# Patient Record
Sex: Male | Born: 1985 | Race: Black or African American | Hispanic: No | Marital: Married | Smoking: Former smoker
Health system: Southern US, Community
[De-identification: ages and names within clinical notes are randomized; demographics above are authoritative.]

## PROBLEM LIST (undated history)

## (undated) DIAGNOSIS — R202 Paresthesia of skin: Secondary | ICD-10-CM

## (undated) DIAGNOSIS — R519 Headache, unspecified: Secondary | ICD-10-CM

## (undated) DIAGNOSIS — R51 Headache: Secondary | ICD-10-CM

## (undated) HISTORY — DX: Paresthesia of skin: R20.2

## (undated) HISTORY — DX: Headache, unspecified: R51.9

## (undated) HISTORY — PX: APPENDECTOMY: SHX54

## (undated) HISTORY — PX: EYE SURGERY: SHX253

## (undated) HISTORY — DX: Headache: R51

---

## 2015-03-07 ENCOUNTER — Encounter (HOSPITAL_COMMUNITY): Payer: Self-pay | Admitting: *Deleted

## 2015-03-07 ENCOUNTER — Emergency Department (INDEPENDENT_AMBULATORY_CARE_PROVIDER_SITE_OTHER)
Admission: EM | Admit: 2015-03-07 | Discharge: 2015-03-07 | Disposition: A | Discharge status: Home / Self Care | Payer: Medicaid Other | Source: Home / Self Care | Attending: Family Medicine | Admitting: Family Medicine

## 2015-03-07 DIAGNOSIS — K2900 Acute gastritis without bleeding: Secondary | ICD-10-CM | POA: Diagnosis not present

## 2015-03-07 MED ORDER — RANITIDINE HCL 150 MG PO TABS: 150.0000 mg | ORAL_TABLET | Freq: Two times a day (BID) | ORAL | Status: DC

## 2015-03-07 MED ORDER — ONDANSETRON 4 MG PO TBDP
ORAL_TABLET | ORAL | Status: AC
Start: 2015-03-07 — End: 2015-03-07
  Filled 2015-03-07: qty 1

## 2015-03-07 MED ORDER — ONDANSETRON 4 MG PO TBDP
4.0000 mg | ORAL_TABLET | Freq: Once | ORAL | Status: AC
  Administered 2015-03-07: 4 mg via ORAL

## 2015-03-07 MED ORDER — GI COCKTAIL ~~LOC~~
30.0000 mL | Freq: Once | ORAL | Status: AC
  Administered 2015-03-07: 30 mL via ORAL

## 2015-03-07 MED ORDER — GI COCKTAIL ~~LOC~~
ORAL | Status: AC
  Filled 2015-03-07: qty 30

## 2015-03-07 NOTE — ED Notes (Signed)
Pt   Reports  Abdominal  Pain  With  Nausea         No  Vomiting x  10 days    Worse  Yesterday       Sitting  Upright on  Exam table  Speaking in  Complete  sentances  And  Is  In no  Acute distress     recentlly  Arrived from  [place]

## 2015-03-07 NOTE — ED Provider Notes (Signed)
CSN: [id number]     Arrival date & time 03/07/15  1624 History   First MD Initiated Contact with Patient 03/07/15 1720     Chief Complaint  Patient presents with  . Abdominal Pain   (Consider location/radiation/quality/duration/timing/severity/associated sxs/prior Treatment) Patient is a 29 y.o. male presenting with abdominal pain. The history is provided by the patient. The history is limited by a language barrier. A language interpreter was used.  Abdominal Pain Pain location:  Epigastric Pain quality: burning   Pain radiates to:  Back Pain severity:  Mild Onset quality:  Gradual Duration:  10 days Progression:  Unchanged Chronicity:  New Context comment:  Refugee from [place] , arrived yest., has been told he  has IBS Relieved by:  None tried Worsened by:  Nothing tried Ineffective treatments:  None tried Associated symptoms: chest pain and nausea   Associated symptoms: no anorexia, no diarrhea, no fever and no vomiting     History reviewed. No pertinent past medical history. Past Surgical History  Procedure Laterality Date  . Appendectomy     History reviewed. No pertinent family history. Social History  Substance Use Topics  . Smoking status: Never Smoker   . Smokeless tobacco: None  . Alcohol Use: No    Review of Systems  Constitutional: Negative.  Negative for fever.  Respiratory: Negative.   Cardiovascular: Positive for chest pain.  Gastrointestinal: Positive for nausea and abdominal pain. Negative for vomiting, diarrhea and anorexia.    Allergies  Review of patient's allergies indicates no known allergies.  Home Medications   Prior to Admission medications   Medication Sig Start Date End Date Taking? Authorizing Provider  ranitidine (ZANTAC) 150 MG tablet Take 1 tablet (150 mg total) by mouth 2 (two) times daily. 03/07/15   Linna Hoff, MD   BP 122/82 mmHg  Pulse 64  Temp(Src) 98.5 F (36.9 C) (Oral)  Resp 16  SpO2 100% Physical Exam   Constitutional: He is oriented to person, place, and time. He appears well-developed and well-nourished. No distress.  HENT:  Mouth/Throat: Oropharynx is clear and moist.  Neck: Normal range of motion. Neck supple.  Cardiovascular: Normal rate, regular rhythm, normal heart sounds and intact distal pulses.   Pulmonary/Chest: Effort normal and breath sounds normal.  Abdominal: Soft. Bowel sounds are normal. He exhibits no distension and no mass. There is no tenderness. There is no rebound and no guarding.  Neurological: He is alert and oriented to person, place, and time.  Skin: Skin is warm and dry.  Nursing note and vitals reviewed.   ED Course  Procedures (including critical care time) Labs Review Labs Reviewed - No data to display  Imaging Review No results found.   MDM   1. Acute gastritis without hemorrhage        Linna Hoff, MD 03/07/15 1750

## 2015-04-16 ENCOUNTER — Emergency Department (INDEPENDENT_AMBULATORY_CARE_PROVIDER_SITE_OTHER)
Admission: EM | Admit: 2015-04-16 | Discharge: 2015-04-16 | Disposition: A | Discharge status: Home / Self Care | Payer: Medicaid Other | Source: Home / Self Care | Attending: Family Medicine | Admitting: Family Medicine

## 2015-04-16 ENCOUNTER — Encounter (HOSPITAL_COMMUNITY): Payer: Self-pay | Admitting: *Deleted

## 2015-04-16 DIAGNOSIS — K2970 Gastritis, unspecified, without bleeding: Secondary | ICD-10-CM | POA: Diagnosis not present

## 2015-04-16 DIAGNOSIS — K589 Irritable bowel syndrome without diarrhea: Secondary | ICD-10-CM

## 2015-04-16 MED ORDER — OMEPRAZOLE 20 MG PO CPDR: 20.0000 mg | DELAYED_RELEASE_CAPSULE | Freq: Every day | ORAL | Status: DC

## 2015-04-16 NOTE — ED Provider Notes (Signed)
CSN: [id number]     Arrival date & time 04/16/15  1725 History   First MD Initiated Contact with Patient 04/16/15 1757     No chief complaint on file.  (Consider location/radiation/quality/duration/timing/severity/associated sxs/prior Treatment) The history is provided by the patient.   cyst 29 year old refugee from [place] who is been living in [place] for the last 5 months. He developed migratory abdominal pain over the last 3 months without diarrhea or vomiting. He was seen a month ago and given ranitidine and he seemed to be improving but then the symptoms came back.  Patient notices some improvement after eating. When he stressed or angry, the pain is worse  No past medical history on file. Past Surgical History  Procedure Laterality Date  . Appendectomy     No family history on file. Social History  Substance Use Topics  . Smoking status: Never Smoker   . Smokeless tobacco: Not on file  . Alcohol Use: No    Review of Systems  Constitutional: Negative.   HENT: Negative.   Eyes: Negative.   Respiratory: Negative.   Cardiovascular: Negative.   Gastrointestinal: Positive for abdominal pain. Negative for nausea, vomiting, diarrhea, constipation, blood in stool and rectal pain.  Musculoskeletal: Negative.   Skin: Negative.     Allergies  Review of patient's allergies indicates no known allergies.  Home Medications   Prior to Admission medications   Medication Sig Start Date End Date Taking? Authorizing Provider  ranitidine (ZANTAC) 150 MG tablet Take 1 tablet (150 mg total) by mouth 2 (two) times daily. 03/07/15   Linna Hoff, MD   Meds Ordered and Administered this Visit  Medications - No data to display  There were no vitals taken for this visit. No data found.   Physical Exam  ED Course  Procedures (including critical care time)   MDM  Assessment: 29 year old gentleman engage in resettling in [place]. He's had tremendous up he was in his life over the  last 5 months. I suspect he has some degree of posttraumatic stress disorder with IBS and gastritis. It's possible he has some GI parasite, but without more objective findings or disturbing symptoms, I think it's appropriate to wait and take the prescribed measures first.      ICD-9-CM ICD-10-CM   1. Gastritis 535.50 K29.70 omeprazole (PRILOSEC) 20 MG capsule  2. IBS (irritable bowel syndrome) 564.1 K58.9 omeprazole (PRILOSEC) 20 MG capsule     Signed, Elvina Sidle, MD    Elvina Sidle, MD 04/16/15 [phone]

## 2015-04-16 NOTE — Discharge Instructions (Signed)
Please start align probiotic and take daily for 2 weeks   Gastritis, Adult Gastritis is soreness and swelling (inflammation) of the lining of the stomach. Gastritis can develop as a sudden onset (acute) or long-term (chronic) condition. If gastritis is not treated, it can lead to stomach bleeding and ulcers. CAUSES  Gastritis occurs when the stomach lining is weak or damaged. Digestive juices from the stomach then inflame the weakened stomach lining. The stomach lining may be weak or damaged due to viral or bacterial infections. One common bacterial infection is the Helicobacter pylori infection. Gastritis can also result from excessive alcohol consumption, taking certain medicines, or having too much acid in the stomach.  SYMPTOMS  In some cases, there are no symptoms. When symptoms are present, they may include:  Pain or a burning sensation in the upper abdomen.  Nausea.  Vomiting.  An uncomfortable feeling of fullness after eating. DIAGNOSIS  Your caregiver may suspect you have gastritis based on your symptoms and a physical exam. To determine the cause of your gastritis, your caregiver may perform the following:  Blood or stool tests to check for the H pylori bacterium.  Gastroscopy. A thin, flexible tube (endoscope) is passed down the esophagus and into the stomach. The endoscope has a light and camera on the end. Your caregiver uses the endoscope to view the inside of the stomach.  Taking a tissue sample (biopsy) from the stomach to examine under a microscope. TREATMENT  Depending on the cause of your gastritis, medicines may be prescribed. If you have a bacterial infection, such as an H pylori infection, antibiotics may be given. If your gastritis is caused by too much acid in the stomach, H2 blockers or antacids may be given. Your caregiver may recommend that you stop taking aspirin, ibuprofen, or other nonsteroidal anti-inflammatory drugs (NSAIDs). HOME CARE INSTRUCTIONS  Only  take over-the-counter or prescription medicines as directed by your caregiver.  If you were given antibiotic medicines, take them as directed. Finish them even if you start to feel better.  Drink enough fluids to keep your urine clear or pale yellow.  Avoid foods and drinks that make your symptoms worse, such as:  Caffeine or alcoholic drinks.  Chocolate.  Peppermint or mint flavorings.  Garlic and onions.  Spicy foods.  Citrus fruits, such as oranges, lemons, or limes.  Tomato-based foods such as sauce, chili, salsa, and pizza.  Fried and fatty foods.  Eat small, frequent meals instead of large meals. SEEK IMMEDIATE MEDICAL CARE IF:   You have black or dark red stools.  You vomit blood or material that looks like coffee grounds.  You are unable to keep fluids down.  Your abdominal pain gets worse.  You have a fever.  You do not feel better after 1 week.  You have any other questions or concerns. MAKE SURE YOU:  Understand these instructions.  Will watch your condition.  Will get help right away if you are not doing well or get worse. Document Released: 06/30/2001 Document Revised: 01/05/2012 Document Reviewed: 08/19/2011 [facility] Patient Information 2015 [place], [place]. This information is not intended to replace advice given to you by your health care provider. Make sure you discuss any questions you have with your health care provider. Irritable Bowel Syndrome Irritable bowel syndrome (IBS) is caused by a disturbance of normal bowel function and is a common digestive disorder. You may also hear this condition called spastic colon, mucous colitis, and irritable colon. There is no cure for IBS. However,  symptoms often gradually improve or disappear with a good diet, stress management, and medicine. This condition usually appears in late adolescence or early adulthood. Women develop it twice as often as men. CAUSES  After food has been digested and absorbed in the  small intestine, waste material is moved into the large intestine, or colon. In the colon, water and salts are absorbed from the undigested products coming from the small intestine. The remaining residue, or fecal material, is held for elimination. Under normal circumstances, gentle, rhythmic contractions of the bowel walls push the fecal material along the colon toward the rectum. In IBS, however, these contractions are irregular and poorly coordinated. The fecal material is either retained too long, resulting in constipation, or expelled too soon, producing diarrhea. SIGNS AND SYMPTOMS  The most common symptom of IBS is abdominal pain. It is often in the lower left side of the abdomen, but it may occur anywhere in the abdomen. The pain comes from spasms of the bowel muscles happening too much and from the buildup of gas and fecal material in the colon. This pain:  Can range from sharp abdominal cramps to a dull, continuous ache.  Often worsens soon after eating.  Is often relieved by having a bowel movement or passing gas. Abdominal pain is usually accompanied by constipation, but it may also produce diarrhea. The diarrhea often occurs right after a meal or upon waking up in the morning. The stools are often soft, watery, and flecked with mucus. Other symptoms of IBS include:  Bloating.  Loss of appetite.  Heartburn.  Backache.  Dull pain in the arms or shoulders.  Nausea.  Burping.  Vomiting.  Gas. IBS may also cause symptoms that are unrelated to the digestive system, such as:  Fatigue.  Headaches.  Anxiety.  Shortness of breath.  Trouble concentrating.  Dizziness. These symptoms tend to come and go. DIAGNOSIS  The symptoms of IBS may seem like symptoms of other, more serious digestive disorders. Your health care provider may want to perform tests to exclude these disorders.  TREATMENT Many medicines are available to help correct bowel function or relieve bowel  spasms and abdominal pain. Among the medicines available are:  Laxatives for severe constipation and to help restore normal bowel habits.  Specific antidiarrheal medicines to treat severe or lasting diarrhea.  Antispasmodic agents to relieve intestinal cramps. Your health care provider may also decide to treat you with a mild tranquilizer or sedative during unusually stressful periods in your life. Your health care provider may also prescribe antidepressant medicine. The use of this medicine has been shown to reduce pain and other symptoms of IBS. Remember that if any medicine is prescribed for you, you should take it exactly as directed. Make sure your health care provider knows how well it worked for you. HOME CARE INSTRUCTIONS   Take all medicines as directed by your health care provider.  Avoid foods that are high in fat or oils, such as heavy cream, butter, frankfurters, sausage, and other fatty meats.  Avoid foods that make you go to the bathroom, such as fruit, fruit juice, and dairy products.  Cut out carbonated drinks, chewing gum, and "gassy" foods such as beans and cabbage. This may help relieve bloating and burping.  Eat foods with bran, and drink plenty of liquids with the bran foods. This helps relieve constipation.  Keep track of what foods seem to bring on your symptoms.  Avoid emotionally charged situations or circumstances that produce anxiety.  Start  or continue exercising.  Get plenty of rest and sleep. Document Released: 07/06/2005 Document Revised: 07/11/2013 Document Reviewed: 02/24/2008 [facility] Patient Information 2015 [place], [place]. This information is not intended to replace advice given to you by your health care provider. Make sure you discuss any questions you have with your health care provider.

## 2015-04-16 NOTE — ED Notes (Signed)
abd  Pain        No  Nausea  Or  Vomiting   No  Diarrhea      Symptoms  X  2  Days

## 2015-05-21 ENCOUNTER — Emergency Department (HOSPITAL_COMMUNITY): Payer: Medicaid Other

## 2015-05-21 ENCOUNTER — Emergency Department (HOSPITAL_COMMUNITY)
Admission: EM | Admit: 2015-05-21 | Discharge: 2015-05-21 | Disposition: A | Discharge status: Home / Self Care | Payer: Medicaid Other | Attending: Emergency Medicine | Admitting: Emergency Medicine

## 2015-05-21 ENCOUNTER — Encounter (HOSPITAL_COMMUNITY): Payer: Self-pay

## 2015-05-21 DIAGNOSIS — R079 Chest pain, unspecified: Secondary | ICD-10-CM | POA: Diagnosis present

## 2015-05-21 DIAGNOSIS — Z79899 Other long term (current) drug therapy: Secondary | ICD-10-CM | POA: Insufficient documentation

## 2015-05-21 DIAGNOSIS — F419 Anxiety disorder, unspecified: Secondary | ICD-10-CM | POA: Diagnosis not present

## 2015-05-21 LAB — BASIC METABOLIC PANEL
ANION GAP: 9 (ref 5–15)
BUN: 9 mg/dL (ref 6–20)
CHLORIDE: 99 mmol/L — AB (ref 101–111)
CO2: 27 mmol/L (ref 22–32)
Calcium: 9.7 mg/dL (ref 8.9–10.3)
Creatinine, Ser: 0.85 mg/dL (ref 0.61–1.24)
GFR calc non Af Amer: >60 mL/min (ref >60)
Glucose, Bld: 107 mg/dL — ABNORMAL HIGH (ref 65–99)
POTASSIUM: 3.8 mmol/L (ref 3.5–5.1)
SODIUM: 135 mmol/L (ref 135–145)

## 2015-05-21 LAB — CBC
HEMATOCRIT: 45.5 % (ref 39.0–52.0)
Hemoglobin: 16 g/dL (ref 13.0–17.0)
MCH: 31.9 pg (ref 26.0–34.0)
MCHC: 35.2 g/dL (ref 30.0–36.0)
MCV: 90.6 fL (ref 78.0–100.0)
PLATELETS: 164 10*3/uL (ref 150–400)
RBC: 5.02 MIL/uL (ref 4.22–5.81)
RDW: 13.1 % (ref 11.5–15.5)
WBC: 7.5 10*3/uL (ref 4.0–10.5)

## 2015-05-21 LAB — I-STAT TROPONIN, ED: Troponin i, poc: 0 ng/mL (ref 0.00–0.08)

## 2015-05-21 NOTE — Discharge Instructions (Signed)
Return without fail for worsening symptoms, including worsening pain, difficulty breathing, passing out or pain while exercising, or any other symptoms concerning to you.  Nonspecific Chest Pain It is often hard to find the cause of chest pain. There is always a chance that your pain could be related to something serious, such as a heart attack or a blood clot in your lungs. Chest pain can also be caused by conditions that are not life-threatening. If you have chest pain, it is very important to follow up with your doctor.  HOME CARE  If you were prescribed an antibiotic medicine, finish it all even if you start to feel better.  Avoid any activities that cause chest pain.  Do not use any tobacco products, including cigarettes, chewing tobacco, or electronic cigarettes. If you need help quitting, ask your doctor.  Do not drink alcohol.  Take medicines only as told by your doctor.  Keep all follow-up visits as told by your doctor. This is important. This includes any further testing if your chest pain does not go away.  Your doctor may tell you to keep your head raised (elevated) while you sleep.  Make lifestyle changes as told by your doctor. These may include:  Getting regular exercise. Ask your doctor to suggest some activities that are safe for you.  Eating a heart-healthy diet. Your doctor or a diet specialist (dietitian) can help you to learn healthy eating options.  Maintaining a healthy weight.  Managing diabetes, if necessary.  Reducing stress. GET HELP IF:  Your chest pain does not go away, even after treatment.  You have a rash with blisters on your chest.  You have a fever. GET HELP RIGHT AWAY IF:  Your chest pain is worse.  You have an increasing cough, or you cough up blood.  You have severe belly (abdominal) pain.  You feel extremely weak.  You pass out (faint).  You have chills.  You have sudden, unexplained chest discomfort.  You have sudden,  unexplained discomfort in your arms, back, neck, or jaw.  You have shortness of breath at any time.  You suddenly start to sweat, or your skin gets clammy.  You feel nauseous.  You vomit.  You suddenly feel light-headed or dizzy.  Your heart begins to beat quickly, or it feels like it is skipping beats. These symptoms may be an emergency. Do not wait to see if the symptoms will go away. Get medical help right away. Call your local emergency services (911 in the [place] Do not drive yourself to the hospital.   This information is not intended to replace advice given to you by your health care provider. Make sure you discuss any questions you have with your health care provider.   Document Released: 12/23/2007 Document Revised: 07/27/2014 Document Reviewed: 02/09/2014 Elsevier Interactive Patient Education [employer]2016 [employer]vier Inc.

## 2015-05-21 NOTE — ED Notes (Addendum)
Pt on hold for D/C waiting for social worker per Dr. Verdie MosherLiu

## 2015-05-21 NOTE — ED Notes (Signed)
Triaged using Arabic interpreter via telephone. Pt reports intermittent left sided chest pain ongoing "for a while when I am stressed. When I am stressed this is exactly what happens to me." Pt also reports SOB with these episodes and is concerned about his left hand appearing red or discolored. Left hand appears WDL.

## 2015-05-21 NOTE — Care Management (Signed)
ED CM received consult concerning establishing follow up care patient from [place] and speaks only Arabic. CM reviewed patient's record, patient is a [place] refugee living here for 5 months. CM communicated with patient via [employer] [phone]. CM explained via interpreter that patient is being discharged today with a [facility]11 at 3:30pm to establish care, patient was instructed to bring ID.  [facility] hand[facility] and told to show this information to the interpreter that will come to pick him up tonight. Patient verbalized understanding via the interpreter. No further questions or concerns, as per interpreter. Dr. Joni FearsLui and Shanda BumpsJessica RN made aware.

## 2015-05-21 NOTE — ED Notes (Signed)
Patient is alert and orientedx4.  Patient was explained discharge instructions and they understood them with no questions.  An interpreter was used to explain all to him.

## 2015-05-21 NOTE — ED Notes (Signed)
MD at bedside w/ RN using translator phone to communicate and assess.

## 2015-05-21 NOTE — ED Provider Notes (Signed)
CSN: [id number]     Arrival date & time 05/21/15  1453 History   First MD Initiated Contact with Patient 05/21/15 1834     Chief Complaint  Patient presents with  . Chest Pain     (Consider location/radiation/quality/duration/timing/severity/associated sxs/prior Treatment) HPI   History is obtained through an Arabic speaking interpreter. 29 year old male who presents with chest pain. He is otherwise healthy. States that for several years now he has been having intermittent chest pain. Described as a pressure and tightness over the left side of his chest, and occurs when he is very anxious or having significant amount of stress. Is sometimes associated with shortness of breath he states. States that his symptoms typically go away when his anxiousness resolves. Denies any associating lightheadedness, syncope, nausea or vomiting. Has not had any recent illnesses including fevers, chills, cough, or other upper respiratory symptoms. Used to play soccer, and states that he has never had chest pain difficulty breathing syncope or near syncope while doing exertional activities. Does not smoke, does not have a strong family history of early heart disease or sudden death. Currently asymptomatic.   History reviewed. No pertinent past medical history. Past Surgical History  Procedure Laterality Date  . Appendectomy     History reviewed. No pertinent family history. Social History  Substance Use Topics  . Smoking status: Never Smoker   . Smokeless tobacco: None  . Alcohol Use: No    Review of Systems 10/14 systems reviewed and are negative other than those stated in the HPI    Allergies  Review of patient's allergies indicates no known allergies.  Home Medications   Prior to Admission medications   Medication Sig Start Date End Date Taking? Authorizing Provider  omeprazole (PRILOSEC) 20 MG capsule Take 1 capsule (20 mg total) by mouth daily. 04/16/15  Yes Elvina Sidle, MD  ranitidine  (ZANTAC) 150 MG tablet Take 1 tablet (150 mg total) by mouth 2 (two) times daily. Patient not taking: Reported on 05/21/2015 03/07/15   Linna Hoff, MD   BP 122/74 mmHg  Pulse 65  Temp(Src) 97.4 F (36.3 C) (Oral)  Resp 16  SpO2 98% Physical Exam Physical Exam  Nursing note and vitals reviewed. Constitutional: Well developed, well nourished, non-toxic, and in no acute distress Head: Normocephalic and atraumatic.  Mouth/Throat: Oropharynx is clear and moist.  Neck: Normal range of motion. Neck supple.  Cardiovascular: Normal rate and regular rhythm.   Pulmonary/Chest: Effort normal and breath sounds normal.  Abdominal: Soft. There is minimal epigastric tenderness. There is no rebound and no guarding.  Musculoskeletal: Normal range of motion.  Neurological: Alert, no facial droop, fluent speech, moves all extremities symmetrically Skin: Skin is warm and dry.  Psychiatric: Cooperative   ED Course  Procedures (including critical care time) Labs Review Labs Reviewed  BASIC METABOLIC PANEL - Abnormal; Notable for the following:    Chloride 99 (*)    Glucose, Bld 107 (*)    All other components within normal limits  CBC  I-STAT TROPOININ, ED    Imaging Review Dg Chest 2 View  05/21/2015  CLINICAL DATA:  Left-sided chest pain and left arm pain today. EXAM: CHEST  2 VIEW COMPARISON:  None. FINDINGS: The heart size and mediastinal contours are within normal limits. Both lungs are clear. The visualized skeletal structures are unremarkable. IMPRESSION: Normal chest x-ray. Electronically Signed   By: Rudie Meyer M.D.   On: 05/21/2015 16:11   I have personally reviewed and evaluated these images  and lab results as part of my medical decision-making.   EKG Interpretation   Date/Time:  Tuesday May 21 2015 15:13:25 EDT Ventricular Rate:  93 PR Interval:  142 QRS Duration: 82 QT Interval:  386 QTC Calculation: 479 R Axis:   82 Text Interpretation:  Normal sinus rhythm  Biatrial enlargement Abnormal  ECG No prior EKG for comparison Confirmed by Rosabelle Jupin MD, Audine Mangione ([id number]) on  05/21/2015 6:46:43 PM      MDM   Final diagnoses:  Chest pain, unspecified chest pain type  Anxiety    In short, this is an otherwise healthy 29 year old male who presents with chest pain associated with stress and anxiety. He is well-appearing and in no acute distress. Vital signs are non-concerning. Cardiopulmonary exam is unremarkable. Remainder of exam is nonfocal. Chest x-ray showing no acute cardiopulmonary processes. EKG showing no stigmata of arrhythmia or acute ischemia. Basic blood work is overall unremarkable. History is suggestive of benign etiology of his symptoms as he only has this when he is stressed and anxious. No concerning features on history or exam to suggest cardiopulmonary cause. I do not suspect serious or toxic etiology of his symptoms. Social work has arranged follow-up with arabic speaking physician/clinic for follow-up regarding any further management of his anxiety. Strict return and follow-up instructions reviewed. He expressed understanding of all discharge instructions and felt comfortable with the plan of care.     Lavera Guiseana Duo Everli Rother, MD 05/22/15 [id number]

## 2015-05-22 ENCOUNTER — Encounter (HOSPITAL_COMMUNITY): Payer: Self-pay | Admitting: Emergency Medicine

## 2015-05-31 ENCOUNTER — Ambulatory Visit: Payer: Medicaid Other | Admitting: Family Medicine

## 2015-07-04 ENCOUNTER — Emergency Department (INDEPENDENT_AMBULATORY_CARE_PROVIDER_SITE_OTHER)
Admission: EM | Admit: 2015-07-04 | Discharge: 2015-07-04 | Disposition: A | Discharge status: Home / Self Care | Payer: Medicaid Other | Source: Home / Self Care | Attending: Family Medicine | Admitting: Family Medicine

## 2015-07-04 ENCOUNTER — Encounter (HOSPITAL_COMMUNITY): Payer: Self-pay | Admitting: Emergency Medicine

## 2015-07-04 DIAGNOSIS — K2960 Other gastritis without bleeding: Secondary | ICD-10-CM | POA: Diagnosis not present

## 2015-07-04 DIAGNOSIS — K2970 Gastritis, unspecified, without bleeding: Secondary | ICD-10-CM

## 2015-07-04 DIAGNOSIS — H9203 Otalgia, bilateral: Secondary | ICD-10-CM | POA: Diagnosis not present

## 2015-07-04 MED ORDER — LANSOPRAZOLE 30 MG PO CPDR: 30.0000 mg | DELAYED_RELEASE_CAPSULE | Freq: Every day | ORAL | Status: DC

## 2015-07-04 MED ORDER — SUCRALFATE 1 G PO TABS: 1.0000 g | ORAL_TABLET | Freq: Three times a day (TID) | ORAL | Status: DC

## 2015-07-04 NOTE — ED Notes (Addendum)
Epigastric pain  Ear pain Daniel Rasmussenavid Mabe, NP in treatment room with interpreter phone prior to this nurse

## 2015-07-04 NOTE — ED Provider Notes (Signed)
CSN: [id number]     Arrival date & time 07/04/15  1336 History   First MD Initiated Contact with Patient 07/04/15 1429     Chief Complaint  Patient presents with  . Abdominal Pain  . Otalgia   (Consider location/radiation/quality/duration/timing/severity/associated sxs/prior Treatment) HPI Comments: 29 year old refugee from [place] with complaints of earache and abdominal pain. Using an Arabic interpreter for medication. He has had pain high in the epigastrium off and on for over a year. He has been seen in the emergency department for this at least once before. He has been diagnosed with gastritis and irritable bowel syndrome. The pain is located high in the epigastrium. It tends to radiate to the left upper abdomen. It is associated with bloating, belching. It occurs randomly. It may occur once a months or maybe 2-3 times a month. He has no associated nausea, vomiting or fever. Sometimes food may produce symptoms but nothing else seems to make it worse. He has seen no GI bleeding. He states his bowel movements are normal for him.  His second complaint is that of bilateral ear pain. He has had this for over a year. He states the environment  in which he worked prior to him  imigrating to the [place] was loud and this caused pain in his ears. He states the same is occuring now. No changes in hearing.   History reviewed. No pertinent past medical history. Past Surgical History  Procedure Laterality Date  . Appendectomy     No family history on file. Social History  Substance Use Topics  . Smoking status: Never Smoker   . Smokeless tobacco: None  . Alcohol Use: No    Review of Systems  Constitutional: Negative for fever, diaphoresis, activity change and fatigue.  HENT: Positive for ear pain. Negative for facial swelling, postnasal drip, rhinorrhea, sore throat and trouble swallowing.   Eyes: Negative.   Respiratory: Negative for cough and shortness of breath.   Cardiovascular: Negative for  chest pain, palpitations and leg swelling.  Gastrointestinal: Positive for abdominal pain. Negative for nausea, vomiting, diarrhea, constipation, blood in stool, abdominal distention and anal bleeding.  Genitourinary: Negative.   Musculoskeletal: Negative.   Skin: Negative.   Neurological: Negative.   Hematological: Negative.     Allergies  Review of patient's allergies indicates no known allergies.  Home Medications   Prior to Admission medications   Medication Sig Start Date End Date Taking? Authorizing Provider  lansoprazole (PREVACID) 30 MG capsule Take 1 capsule (30 mg total) by mouth daily at 12 noon. 07/04/15   Hayden Rasmussen, NP  omeprazole (PRILOSEC) 20 MG capsule Take 1 capsule (20 mg total) by mouth daily. 04/16/15   Elvina Sidle, MD  sucralfate (CARAFATE) 1 G tablet Take 1 tablet (1 g total) by mouth 4 (four) times daily -  with meals and at bedtime. 07/04/15   Hayden Rasmussen, NP   Meds Ordered and Administered this Visit  Medications - No data to display  BP 131/81 mmHg  Pulse 70  Temp(Src) 97.8 F (36.6 C) (Oral)  Resp 16  SpO2 99% No data found.   Physical Exam  Constitutional: He is oriented to person, place, and time. He appears well-developed and well-nourished. No distress.  HENT:  Mouth/Throat: No oropharyngeal exudate.  Bilateral TMs are normal. Oropharynx with mild clear PND otherwise normal.  Eyes: Conjunctivae and EOM are normal.  Neck: Normal range of motion. Neck supple.  Cardiovascular: Normal rate, regular rhythm, normal heart sounds and intact distal pulses.  Pulmonary/Chest: Effort normal and breath sounds normal. No respiratory distress. He has no wheezes. He has no rales.  Abdominal: Soft. Bowel sounds are normal. He exhibits no mass. There is no rebound and no guarding.  Minor tenderness high in the epigastrium. Less in the left upper quadrant. Abdomen is scaphoid.  Musculoskeletal: Normal range of motion. He exhibits no edema.   Lymphadenopathy:    He has no cervical adenopathy.  Neurological: He is alert and oriented to person, place, and time. He exhibits normal muscle tone. Coordination normal.  Skin: Skin is warm and dry.  Psychiatric: He has a normal mood and affect.  Nursing note and vitals reviewed.   ED Course  Procedures (including critical care time)  Labs Review Labs Reviewed - No data to display  Imaging Review No results found.   Visual Acuity Review  Right Eye Distance:   Left Eye Distance:   Bilateral Distance:    Right Eye Near:   Left Eye Near:    Bilateral Near:         MDM   1. Gastritis   2. Reflux gastritis   3. Otalgia of both ears    Carafate 1 gm qid Prevacid 30 mg  See PCP ASAP    Hayden Rasmussenavid Samika Vetsch, NP 07/04/15 1534

## 2015-07-04 NOTE — Discharge Instructions (Signed)

## 2015-07-04 NOTE — ED Notes (Signed)
Patient speaks arabic, provider and nurse will speak to patient together utilizing approved phone services.

## 2015-07-31 ENCOUNTER — Ambulatory Visit (INDEPENDENT_AMBULATORY_CARE_PROVIDER_SITE_OTHER): Payer: Medicaid Other | Admitting: Family Medicine

## 2015-07-31 VITALS — BP 120/67 | HR 76 | Temp 98.0°F | Ht 73.25 in | Wt 163.4 lb

## 2015-07-31 DIAGNOSIS — R1013 Epigastric pain: Secondary | ICD-10-CM

## 2015-07-31 DIAGNOSIS — G8929 Other chronic pain: Secondary | ICD-10-CM

## 2015-07-31 DIAGNOSIS — H9203 Otalgia, bilateral: Secondary | ICD-10-CM | POA: Diagnosis not present

## 2015-07-31 DIAGNOSIS — Z9889 Other specified postprocedural states: Secondary | ICD-10-CM

## 2015-07-31 DIAGNOSIS — Z008 Encounter for other general examination: Secondary | ICD-10-CM

## 2015-07-31 DIAGNOSIS — Z0289 Encounter for other administrative examinations: Secondary | ICD-10-CM

## 2015-07-31 LAB — POCT H PYLORI SCREEN: H Pylori Screen, POC: POSITIVE

## 2015-07-31 MED ORDER — RANITIDINE HCL 150 MG PO TABS: 150.0000 mg | ORAL_TABLET | Freq: Two times a day (BID) | ORAL | Status: DC
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
## 2015-07-31 NOTE — Patient Instructions (Addendum)
Your doctor's name is Dr. Caroleen Hammanumley.    It was good to see you today.  Take the Ranitidine 1 pill a day for stomach pain.   Use ear plugs for ear protection.  Your ears look good right now.  Daniel Nelson will come back here for her pregnancy.    We will refer Basel to an Ear Nose and Throat doctor.    Come back to see us in about 1 month.

## 2015-08-01 DIAGNOSIS — G8929 Other chronic pain: Secondary | ICD-10-CM | POA: Insufficient documentation

## 2015-08-01 DIAGNOSIS — Z0289 Encounter for other administrative examinations: Secondary | ICD-10-CM | POA: Insufficient documentation

## 2015-08-01 DIAGNOSIS — Z9889 Other specified postprocedural states: Secondary | ICD-10-CM | POA: Insufficient documentation

## 2015-08-01 DIAGNOSIS — H9209 Otalgia, unspecified ear: Secondary | ICD-10-CM | POA: Insufficient documentation

## 2015-08-01 DIAGNOSIS — R1013 Epigastric pain: Secondary | ICD-10-CM

## 2015-08-01 NOTE — Assessment & Plan Note (Signed)
-   Suspect laser eye surgery, however patient unable to give full details - Referral to Ophthalmology

## 2015-08-01 NOTE — Assessment & Plan Note (Signed)
-   No acute infection at this time.  - Caused by loud noises per patient - Recommend ear plugs to help with protection. Underlined on AVS and told to show to pharmacist to aid with finding in store.

## 2015-08-01 NOTE — Assessment & Plan Note (Addendum)
-   H. Pylori checked and returned positive - Noted improvement with Ranitidine. Prescription given. Also noted to have Carafate around time of noted improvement--consider prescribing if no improvement with Ranitidine. - Consider treatment with triple therapy at follow up with PCP

## 2015-08-01 NOTE — Assessment & Plan Note (Addendum)
-   No class a or b conditions noted in history. - Follow up in 4 weeks with PCP

## 2015-08-16 ENCOUNTER — Ambulatory Visit (INDEPENDENT_AMBULATORY_CARE_PROVIDER_SITE_OTHER): Payer: Medicaid Other | Admitting: Family Medicine

## 2015-08-16 VITALS — BP 124/78 | HR 73 | Temp 98.4°F | Wt 162.2 lb

## 2015-08-16 DIAGNOSIS — B9681 Helicobacter pylori [H. pylori] as the cause of diseases classified elsewhere: Secondary | ICD-10-CM | POA: Diagnosis not present

## 2015-08-16 DIAGNOSIS — Z23 Encounter for immunization: Secondary | ICD-10-CM | POA: Diagnosis present

## 2015-08-16 DIAGNOSIS — A048 Other specified bacterial intestinal infections: Secondary | ICD-10-CM

## 2015-08-16 DIAGNOSIS — Z9889 Other specified postprocedural states: Secondary | ICD-10-CM | POA: Diagnosis not present

## 2015-08-16 DIAGNOSIS — Z20828 Contact with and (suspected) exposure to other viral communicable diseases: Secondary | ICD-10-CM

## 2015-08-16 MED ORDER — OMEPRAZOLE 20 MG PO CPDR: 20.0000 mg | DELAYED_RELEASE_CAPSULE | Freq: Two times a day (BID) | ORAL | Status: DC

## 2015-08-16 MED ORDER — CLARITHROMYCIN 500 MG PO TABS: 500.0000 mg | ORAL_TABLET | Freq: Two times a day (BID) | ORAL | Status: DC

## 2015-08-16 MED ORDER — OSELTAMIVIR PHOSPHATE 75 MG PO CAPS: 75.0000 mg | ORAL_CAPSULE | Freq: Two times a day (BID) | ORAL | Status: DC

## 2015-08-16 MED ORDER — AMOXICILLIN 500 MG PO TABS
1000.0000 mg | ORAL_TABLET | Freq: Two times a day (BID) | ORAL | Status: DC
Start: 2015-08-16 — End: 2015-09-26

## 2015-08-16 NOTE — Patient Instructions (Signed)
-   Omeprazole twice a day for 14 days. - Amoxicillin 2 tablets twice a day for 14 days. - Clarithromycin twice a day for 14 days. - Return in 6 weeks - Tamiflu twice a day for 5 days

## 2015-08-17 DIAGNOSIS — A048 Other specified bacterial intestinal infections: Secondary | ICD-10-CM | POA: Insufficient documentation

## 2015-08-17 DIAGNOSIS — Z20828 Contact with and (suspected) exposure to other viral communicable diseases: Secondary | ICD-10-CM | POA: Insufficient documentation

## 2015-08-17 NOTE — Assessment & Plan Note (Signed)
-   H. Pylori positive at last office visit. - Discontinue Ranitidine - Initiate on Triple Therapy: Omeprazole, Amoxicillin, Clarithromycin. Continue for 14 days. - Follow up in 6 weeks. Will retest for H. Pylori at this office visit.

## 2015-08-17 NOTE — Assessment & Plan Note (Addendum)
-   Referral to Ophthalmology placed. Will require Arabic Interpretor to schedule.

## 2015-08-17 NOTE — Assessment & Plan Note (Addendum)
-   Reported exposure by patient. History of flu vaccination. Reports subjective fevers, body aches.  - Only one flu swab available in office. Presented with pregnant wife, who reports same symptoms. Swab used on wife given increased mortality of flu in pregnancy with suspicion that same viral infection is causing symptoms in both patients. - Start Tamiflu BID x5 days

## 2015-08-17 NOTE — Progress Notes (Signed)
Subjective:     Patient ID: Daniel Nelson, male   DOB: 06/04/1986, 30 y.o.   MRN: [id number]  HPI Mrs. Snedden is a 30yo male presenting today for follow up of abdominal pain. Also notes suspected flu. Visit conducted with aid of Spanish Interpretor.  # Abdominal Pain: - Reports improvement of abdominal pain since last visit - Pain has been present intermittently for more than two years - Pain still located in epigastric area. - Worsened by being upset, spicy foods - Denies nausea/vomiting/diarrhea. - Notes bloating - H. Pylori positive at last office visit  # Flu Exposure: - Reports exposure to flu. Father had flu and presented to hospital. Wife also sick. - Reports occasional dizziness, headache, sinus pain and pressure, nasal congestion, body aches, subjective fevers - Does not have thermometer to measure temperature - Reports history of having flu shot - Symptoms present since yesterday - Has not taken any medications for symptoms - Denies cough, nausea, vomiting, diarrhea, constipation.  # H/O Eye Surgery: - Requests referral to Ophthalmology - History of Eye Surgery. Unsure of specific surgery, but initially reported eye transplant and then described surgery similar to laser eye surgery at previous visit.  Review of Systems Per HPI. Other symptoms negative.    Objective:   Physical Exam  Constitutional: He appears well-developed and well-nourished. No distress.  HENT:  Head: Normocephalic and atraumatic.  Mouth/Throat: Oropharynx is clear and moist.  Nasal turbinates pink. Sinuses symmetric with illumination.  Cardiovascular: Normal rate and regular rhythm.  Exam reveals no gallop and no friction rub.   No murmur heard. Pulmonary/Chest: Effort normal. No respiratory distress. He has no wheezes.  Abdominal: Soft. Bowel sounds are normal. He exhibits no distension.  Epigastric tenderness noted. Negative Murphy's. Negative rebound. Negative Rovsing's.   Musculoskeletal: He exhibits no edema.  Lymphadenopathy:    He has no cervical adenopathy.  Skin: No rash noted.  Psychiatric: He has a normal mood and affect. His behavior is normal.       Assessment and Plan:     H. pylori infection - H. Pylori positive at last office visit. - Discontinue Ranitidine - Initiate on Triple Therapy: Omeprazole, Amoxicillin, Clarithromycin. Continue for 14 days. - Follow up in 6 weeks. Will retest for H. Pylori at this office visit.  H/O eye surgery - Referral to Ophthalmology placed. Will require Arabic Interpretor to schedule.  Exposure to the flu - Reported exposure by patient. History of flu vaccination. Reports subjective fevers, body aches.  - Only one flu swab available in office. Presented with pregnant wife, who reports same symptoms. Swab used on wife given increased mortality of flu in pregnancy with suspicion that same viral infection is causing symptoms in both patients. - Start Tamiflu BID x5 days

## 2015-08-19 ENCOUNTER — Other Ambulatory Visit: Payer: Self-pay | Admitting: Family Medicine

## 2015-09-26 ENCOUNTER — Emergency Department (INDEPENDENT_AMBULATORY_CARE_PROVIDER_SITE_OTHER)
Admission: EM | Admit: 2015-09-26 | Discharge: 2015-09-26 | Disposition: A | Discharge status: Home / Self Care | Payer: Medicaid Other | Source: Home / Self Care | Attending: Emergency Medicine | Admitting: Emergency Medicine

## 2015-09-26 ENCOUNTER — Encounter (HOSPITAL_COMMUNITY): Payer: Self-pay | Admitting: Emergency Medicine

## 2015-09-26 DIAGNOSIS — S39012A Strain of muscle, fascia and tendon of lower back, initial encounter: Secondary | ICD-10-CM | POA: Diagnosis not present

## 2015-09-26 DIAGNOSIS — T148 Other injury of unspecified body region: Secondary | ICD-10-CM | POA: Diagnosis not present

## 2015-09-26 DIAGNOSIS — T148XXA Other injury of unspecified body region, initial encounter: Secondary | ICD-10-CM

## 2015-09-26 MED ORDER — NAPROXEN 375 MG PO TABS: 375.0000 mg | ORAL_TABLET | Freq: Two times a day (BID) | ORAL | Status: DC

## 2015-09-26 MED ORDER — DICLOFENAC SODIUM 1 % TD GEL: 1.0000 "application " | Freq: Four times a day (QID) | TRANSDERMAL | Status: DC

## 2015-09-26 NOTE — ED Provider Notes (Signed)
CSN: [id number]     Arrival date & time 09/26/15  1956 History   First MD Initiated Contact with Patient 09/26/15 2032     Chief Complaint  Patient presents with  . Back Pain   (Consider location/radiation/quality/duration/timing/severity/associated sxs/prior Treatment) HPI Comments: 30 year old male is accompanied by his significant other who is translating for him. Yesterday he was performing a lot of lifting and digging type activities. After which she developed soreness and pain to the lower parathoracic and paralumbar musculature. The pain is worse with bending, moving, twisting, standing from a seated position. The pain does not radiate. There is no spinal pain or tenderness. Denies focal paresthesias or weakness. Denies blunt trauma, fall or other type of injury.   History reviewed. No pertinent past medical history. Past Surgical History  Procedure Laterality Date  . Appendectomy     No family history on file. Social History  Substance Use Topics  . Smoking status: Never Smoker   . Smokeless tobacco: None  . Alcohol Use: No    Review of Systems  Constitutional: Negative.   Respiratory: Negative.   Gastrointestinal: Negative.   Genitourinary: Negative.   Musculoskeletal: Positive for back pain. Negative for gait problem, neck pain and neck stiffness.       As per HPI  Skin: Negative.   Neurological: Negative for dizziness, weakness, numbness and headaches.    Allergies  Review of patient's allergies indicates no known allergies.  Home Medications   Prior to Admission medications   Medication Sig Start Date End Date Taking? Authorizing Provider  diclofenac sodium (VOLTAREN) 1 % GEL Apply 1 application topically 4 (four) times daily. 09/26/15   Hayden Rasmussen, NP  naproxen (NAPROSYN) 375 MG tablet Take 1 tablet (375 mg total) by mouth 2 (two) times daily. 09/26/15   Hayden Rasmussen, NP  omeprazole (PRILOSEC) 20 MG capsule TAKE ONE CAPSULE BY MOUTH TWICE DAILY BEFORE A MEAL 08/19/15    Tobey Grim, MD  sucralfate (CARAFATE) 1 G tablet Take 1 tablet (1 g total) by mouth 4 (four) times daily -  with meals and at bedtime. 07/04/15   Hayden Rasmussen, NP   Meds Ordered and Administered this Visit  Medications - No data to display  BP 109/70 mmHg  Pulse 65  Temp(Src) 98.3 F (36.8 C) (Oral)  Resp 16  SpO2 99% No data found.   Physical Exam  Constitutional: He is oriented to person, place, and time. He appears well-developed and well-nourished.  HENT:  Head: Normocephalic and atraumatic.  Eyes: EOM are normal. Left eye exhibits no discharge.  Neck: Normal range of motion. Neck supple.  Musculoskeletal: He exhibits no edema.  Tenderness to the lower para thoracic and lumbar musculature. Patient is able to flex approximately 30 due to pain. Bilateral flexion also limited due to pain. No spinal tenderness, swelling, deformity or discoloration. Ambulation with smooth and balanced gait. Lower extremity strength is symmetric and 5 over 5.  Neurological: He is alert and oriented to person, place, and time. No cranial nerve deficit.  Skin: Skin is warm and dry.  Psychiatric: He has a normal mood and affect.  Nursing note and vitals reviewed.   ED Course  Procedures (including critical care time)  Labs Review Labs Reviewed - No data to display  Imaging Review No results found.   Visual Acuity Review  Right Eye Distance:   Left Eye Distance:   Bilateral Distance:    Right Eye Near:   Left Eye Near:  Bilateral Near:         MDM   1. Lumbar strain, initial encounter   2. Muscle strain    Apply heat to the areas of the back that hurt, perform stretches slowly has demonstrated. This 2-3 times a day. No heavy lifting or bending for the next few days. Apply diclofenac gel as directed. Naprosyn as needed for pain take as directed. Take with food Meds ordered this encounter  Medications  . diclofenac sodium (VOLTAREN) 1 % GEL    Sig: Apply 1 application  topically 4 (four) times daily.    Dispense:  100 g    Refill:  0    Order Specific Question:  Supervising Provider    Answer:  Charm RingsHONIG, ERIN J [id number]]  . naproxen (NAPROSYN) 375 MG tablet    Sig: Take 1 tablet (375 mg total) by mouth 2 (two) times daily.    Dispense:  20 tablet    Refill:  0    Order Specific Question:  Supervising Provider    Answer:  Micheline ChapmanHONIG, ERIN J [[id number]]       Hayden Rasmussenavid Marella Vanderpol, NP 09/26/15 2101

## 2015-09-26 NOTE — ED Notes (Signed)
Reports low back pain while working.  Onset of pain yesterday.  Pain worsens with movement.  Denies issues with urinating

## 2015-09-26 NOTE — Discharge Instructions (Signed)
Back Injury Prevention Back injuries can be very painful. They can also be difficult to heal. After having one back injury, you are more likely to injure your back again. It is important to learn how to avoid injuring or re-injuring your back. The following tips can help you to prevent a back injury. WHAT SHOULD I KNOW ABOUT PHYSICAL FITNESS?  Exercise for 30 minutes per day on most days of the week or as directed by your health care provider. Make sure to:  Do aerobic exercises, such as walking, jogging, biking, or swimming.  Do exercises that increase balance and strength, such as tai chi and yoga. These can decrease your risk of falling and injuring your back.  Do stretching exercises to help with flexibility.  Try to develop strong abdominal muscles. Your abdominal muscles provide a lot of the support that is needed by your back.  Maintain a healthy weight. This helps to decrease your risk of a back injury. WHAT SHOULD I KNOW ABOUT MY DIET?  Talk with your health care provider about your overall diet. Take supplements and vitamins only as directed by your health care provider.  Talk with your health care provider about how much calcium and vitamin D you need each day. These nutrients help to prevent weakening of the bones (osteoporosis). Osteoporosis can cause broken (fractured) bones, which lead to back pain.  Include good sources of calcium in your diet, such as dairy products, green leafy vegetables, and products that have had calcium added to them (fortified).  Include good sources of vitamin D in your diet, such as milk and foods that are fortified with vitamin D. WHAT SHOULD I KNOW ABOUT MY POSTURE?  Sit up straight and stand up straight. Avoid leaning forward when you sit or hunching over when you stand.  Choose chairs that have good low-back (lumbar) support.  If you work at a desk, sit close to it so you do not need to lean over. Keep your chin tucked in. Keep your neck  drawn back, and keep your elbows bent at a right angle. Your arms should look like the letter "L."  Sit high and close to the steering wheel when you drive. Add a lumbar support to your car seat, if needed.  Avoid sitting or standing in one position for very long. Take breaks to get up, stretch, and walk around at least one time every hour. Take breaks every hour if you are driving for long periods of time.  Sleep on your side with your knees slightly bent, or sleep on your back with a pillow under your knees. Do not lie on the front of your body to sleep. WHAT SHOULD I KNOW ABOUT LIFTING, TWISTING, AND REACHING? Lifting and Heavy Lifting  Avoid heavy lifting, especially repetitive heavy lifting. If you must do heavy lifting:  Stretch before lifting.  Work slowly.  Rest between lifts.  Use a tool such as a cart or a dolly to move objects if one is available.  Make several small trips instead of carrying one heavy load.  Ask for help when you need it, especially when moving big objects.  Follow these steps when lifting:  Stand with your feet shoulder-width apart.  Get as close to the object as you can. Do not try to pick up a heavy object that is far from your body.  Use handles or lifting straps if they are available.  Bend at your knees. Squat down, but keep your heels off the floor.  Keep your shoulders pulled back, your chin tucked in, and your back straight.  Lift the object slowly while you tighten the muscles in your legs, abdomen, and buttocks. Keep the object as close to the center of your body as possible.  Follow these steps when putting down a heavy load:  Stand with your feet shoulder-width apart.  Lower the object slowly while you tighten the muscles in your legs, abdomen, and buttocks. Keep the object as close to the center of your body as possible.  Keep your shoulders pulled back, your chin tucked in, and your back straight.  Bend at your knees. Squat  down, but keep your heels off the floor.  Use handles or lifting straps if they are available. Twisting and Reaching  Avoid lifting heavy objects above your waist.  Do not twist at your waist while you are lifting or carrying a load. If you need to turn, move your feet.  Do not bend over without bending at your knees.  Avoid reaching over your head, across a table, or for an object on a high surface. WHAT ARE SOME OTHER TIPS?  Avoid wet floors and icy ground. Keep sidewalks clear of ice to prevent falls.  Do not sleep on a mattress that is too soft or too hard.  Keep items that are used frequently within easy reach.  Put heavier objects on shelves at waist level, and put lighter objects on lower or higher shelves.  Find ways to decrease your stress, such as exercise, massage, or relaxation techniques. Stress can build up in your muscles. Tense muscles are more vulnerable to injury.  Talk with your health care provider if you feel anxious or depressed. These conditions can make back pain worse.  Wear flat heel shoes with cushioned soles.  Avoid sudden movements.  Use both shoulder straps when carrying a backpack.  Do not use any tobacco products, including cigarettes, chewing tobacco, or electronic cigarettes. If you need help quitting, ask your health care provider.   This information is not intended to replace advice given to you by your health care provider. Make sure you discuss any questions you have with your health care provider.   Document Released: 08/13/2004 Document Revised: 11/20/2014 Document Reviewed: 07/10/2014 [employer] Interactive Patient Education 2016 [place] Strain With Rehab Apply heat to the areas of the back that hurt, perform stretches slowly has demonstrated. This 2-3 times a day. No heavy lifting or bending for the next few days. Apply diclofenac gel as directed. Naprosyn as needed for pain take as directed. Take with food  A strain  is an injury in which a tendon or muscle is torn. The muscles and tendons of the lower back are vulnerable to strains. However, these muscles and tendons are very strong and require a great force to be injured. Strains are classified into three categories. Grade 1 strains cause pain, but the tendon is not lengthened. Grade 2 strains include a lengthened ligament, due to the ligament being stretched or partially ruptured. With grade 2 strains there is still function, although the function may be decreased. Grade 3 strains involve a complete tear of the tendon or muscle, and function is usually impaired. SYMPTOMS   Pain in the lower back.  Pain that affects one side more than the other.  Pain that gets worse with movement and may be felt in the hip, buttocks, or back of the thigh.  Muscle spasms of the muscles in the back.  Swelling  along the muscles of the back.  Loss of strength of the back muscles.  Crackling sound (crepitation) when the muscles are touched. CAUSES  Lower back strains occur when a force is placed on the muscles or tendons that is greater than they can handle. Common causes of injury include:  Prolonged overuse of the muscle-tendon units in the lower back, usually from incorrect posture.  A single violent injury or force applied to the back. RISK INCREASES WITH:  Sports that involve twisting forces on the spine or a lot of bending at the waist (football, rugby, weightlifting, bowling, golf, tennis, speed skating, racquetball, swimming, running, gymnastics, diving).  Poor strength and flexibility.  Failure to warm up properly before activity.  Family history of lower back pain or disk disorders.  Previous back injury or surgery (especially fusion).  Poor posture with lifting, especially heavy objects.  Prolonged sitting, especially with poor posture. PREVENTION   Learn and use proper posture when sitting or lifting (maintain proper posture when sitting, lift  using the knees and legs, not at the waist).  Warm up and stretch properly before activity.  Allow for adequate recovery between workouts.  Maintain physical fitness:  Strength, flexibility, and endurance.  Cardiovascular fitness. PROGNOSIS  If treated properly, lower back strains usually heal within 6 weeks. RELATED COMPLICATIONS   Recurring symptoms, resulting in a chronic problem.  Chronic inflammation, scarring, and partial muscle-tendon tear.  Delayed healing or resolution of symptoms.  Prolonged disability. TREATMENT  Treatment first involves the use of ice and medicine, to reduce pain and inflammation. The use of strengthening and stretching exercises may help reduce pain with activity. These exercises may be performed at home or with a therapist. Severe injuries may require referral to a therapist for further evaluation and treatment, such as ultrasound. Your caregiver may advise that you wear a back brace or corset, to help reduce pain and discomfort. Often, prolonged bed rest results in greater harm then benefit. Corticosteroid injections may be recommended. However, these should be reserved for the most serious cases. It is important to avoid using your back when lifting objects. At night, sleep on your back on a firm mattress with a pillow placed under your knees. If non-surgical treatment is unsuccessful, surgery may be needed.  MEDICATION   If pain medicine is needed, nonsteroidal anti-inflammatory medicines (aspirin and ibuprofen), or other minor pain relievers (acetaminophen), are often advised.  Do not take pain medicine for 7 days before surgery.  Prescription pain relievers may be given, if your caregiver thinks they are needed. Use only as directed and only as much as you need.  Ointments applied to the skin may be helpful.  Corticosteroid injections may be given by your caregiver. These injections should be reserved for the most serious cases, because they may  only be given a certain number of times. HEAT AND COLD  Cold treatment (icing) should be applied for 10 to 15 minutes every 2 to 3 hours for inflammation and pain, and immediately after activity that aggravates your symptoms. Use ice packs or an ice massage.  Heat treatment may be used before performing stretching and strengthening activities prescribed by your caregiver, physical therapist, or athletic trainer. Use a heat pack or a warm water soak. SEEK MEDICAL CARE IF:   Symptoms get worse or do not improve in 2 to 4 weeks, despite treatment.  You develop numbness, weakness, or loss of bowel or bladder function.  New, unexplained symptoms develop. (Drugs used in treatment may  produce side effects.) EXERCISES  RANGE OF MOTION (ROM) AND STRETCHING EXERCISES - Low Back Strain Most people with lower back pain will find that their symptoms get worse with excessive bending forward (flexion) or arching at the lower back (extension). The exercises which will help resolve your symptoms will focus on the opposite motion.  Your physician, physical therapist or athletic trainer will help you determine which exercises will be most helpful to resolve your lower back pain. Do not complete any exercises without first consulting with your caregiver. Discontinue any exercises which make your symptoms worse until you speak to your caregiver.  If you have pain, numbness or tingling which travels down into your buttocks, leg or foot, the goal of the therapy is for these symptoms to move closer to your back and eventually resolve. Sometimes, these leg symptoms will get better, but your lower back pain may worsen. This is typically an indication of progress in your rehabilitation. Be very alert to any changes in your symptoms and the activities in which you participated in the 24 hours prior to the change. Sharing this information with your caregiver will allow him/her to most efficiently treat your condition.  These  exercises may help you when beginning to rehabilitate your injury. Your symptoms may resolve with or without further involvement from your physician, physical therapist or athletic trainer. While completing these exercises, remember:  Restoring tissue flexibility helps normal motion to return to the joints. This allows healthier, less painful movement and activity.  An effective stretch should be held for at least 30 seconds.  A stretch should never be painful. You should only feel a gentle lengthening or release in the stretched tissue. FLEXION RANGE OF MOTION AND STRETCHING EXERCISES: STRETCH - Flexion, Single Knee to Chest   Lie on a firm bed or floor with both legs extended in front of you.  Keeping one leg in contact with the floor, bring your opposite knee to your chest. Hold your leg in place by either grabbing behind your thigh or at your knee.  Pull until you feel a gentle stretch in your lower back. Hold __________ seconds.  Slowly release your grasp and repeat the exercise with the opposite side. Repeat __________ times. Complete this exercise __________ times per day.  STRETCH - Flexion, Double Knee to Chest   Lie on a firm bed or floor with both legs extended in front of you.  Keeping one leg in contact with the floor, bring your opposite knee to your chest.  Tense your stomach muscles to support your back and then lift your other knee to your chest. Hold your legs in place by either grabbing behind your thighs or at your knees.  Pull both knees toward your chest until you feel a gentle stretch in your lower back. Hold __________ seconds.  Tense your stomach muscles and slowly return one leg at a time to the floor. Repeat __________ times. Complete this exercise __________ times per day.  STRETCH - Low Trunk Rotation  Lie on a firm bed or floor. Keeping your legs in front of you, bend your knees so they are both pointed toward the ceiling and your feet are flat on the  floor.  Extend your arms out to the side. This will stabilize your upper body by keeping your shoulders in contact with the floor.  Gently and slowly drop both knees together to one side until you feel a gentle stretch in your lower back. Hold for __________ seconds.  Tense  your stomach muscles to support your lower back as you bring your knees back to the starting position. Repeat the exercise to the other side. Repeat __________ times. Complete this exercise __________ times per day  EXTENSION RANGE OF MOTION AND FLEXIBILITY EXERCISES: STRETCH - Extension, Prone on Elbows   Lie on your stomach on the floor, a bed will be too soft. Place your palms about shoulder width apart and at the height of your head.  Place your elbows under your shoulders. If this is too painful, stack pillows under your chest.  Allow your body to relax so that your hips drop lower and make contact more completely with the floor.  Hold this position for __________ seconds.  Slowly return to lying flat on the floor. Repeat __________ times. Complete this exercise __________ times per day.  RANGE OF MOTION - Extension, Prone Press Ups  Lie on your stomach on the floor, a bed will be too soft. Place your palms about shoulder width apart and at the height of your head.  Keeping your back as relaxed as possible, slowly straighten your elbows while keeping your hips on the floor. You may adjust the placement of your hands to maximize your comfort. As you gain motion, your hands will come more underneath your shoulders.  Hold this position __________ seconds.  Slowly return to lying flat on the floor. Repeat __________ times. Complete this exercise __________ times per day.  RANGE OF MOTION- Quadruped, Neutral Spine   Assume a hands and knees position on a firm surface. Keep your hands under your shoulders and your knees under your hips. You may place padding under your knees for comfort.  Drop your head and point  your tail bone toward the ground below you. This will round out your lower back like an angry cat. Hold this position for __________ seconds.  Slowly lift your head and release your tail bone so that your back sags into a large arch, like an old horse.  Hold this position for __________ seconds.  Repeat this until you feel limber in your lower back.  Now, find your "sweet spot." This will be the most comfortable position somewhere between the two previous positions. This is your neutral spine. Once you have found this position, tense your stomach muscles to support your lower back.  Hold this position for __________ seconds. Repeat __________ times. Complete this exercise __________ times per day.  STRENGTHENING EXERCISES - Low Back Strain These exercises may help you when beginning to rehabilitate your injury. These exercises should be done near your "sweet spot." This is the neutral, low-back arch, somewhere between fully rounded and fully arched, that is your least painful position. When performed in this safe range of motion, these exercises can be used for people who have either a flexion or extension based injury. These exercises may resolve your symptoms with or without further involvement from your physician, physical therapist or athletic trainer. While completing these exercises, remember:   Muscles can gain both the endurance and the strength needed for everyday activities through controlled exercises.  Complete these exercises as instructed by your physician, physical therapist or athletic trainer. Increase the resistance and repetitions only as guided.  You may experience muscle soreness or fatigue, but the pain or discomfort you are trying to eliminate should never worsen during these exercises. If this pain does worsen, stop and make certain you are following the directions exactly. If the pain is still present after adjustments, discontinue the exercise until  you can discuss the  trouble with your caregiver. STRENGTHENING - Deep Abdominals, Pelvic Tilt  Lie on a firm bed or floor. Keeping your legs in front of you, bend your knees so they are both pointed toward the ceiling and your feet are flat on the floor.  Tense your lower abdominal muscles to press your lower back into the floor. This motion will rotate your pelvis so that your tail bone is scooping upwards rather than pointing at your feet or into the floor.  With a gentle tension and even breathing, hold this position for __________ seconds. Repeat __________ times. Complete this exercise __________ times per day.  STRENGTHENING - Abdominals, Crunches   Lie on a firm bed or floor. Keeping your legs in front of you, bend your knees so they are both pointed toward the ceiling and your feet are flat on the floor. Cross your arms over your chest.  Slightly tip your chin down without bending your neck.  Tense your abdominals and slowly lift your trunk high enough to just clear your shoulder blades. Lifting higher can put excessive stress on the lower back and does not further strengthen your abdominal muscles.  Control your return to the starting position. Repeat __________ times. Complete this exercise __________ times per day.  STRENGTHENING - Quadruped, Opposite UE/LE Lift   Assume a hands and knees position on a firm surface. Keep your hands under your shoulders and your knees under your hips. You may place padding under your knees for comfort.  Find your neutral spine and gently tense your abdominal muscles so that you can maintain this position. Your shoulders and hips should form a rectangle that is parallel with the floor and is not twisted.  Keeping your trunk steady, lift your right hand no higher than your shoulder and then your left leg no higher than your hip. Make sure you are not holding your breath. Hold this position __________ seconds.  Continuing to keep your abdominal muscles tense and your  back steady, slowly return to your starting position. Repeat with the opposite arm and leg. Repeat __________ times. Complete this exercise __________ times per day.  STRENGTHENING - Lower Abdominals, Double Knee Lift  Lie on a firm bed or floor. Keeping your legs in front of you, bend your knees so they are both pointed toward the ceiling and your feet are flat on the floor.  Tense your abdominal muscles to brace your lower back and slowly lift both of your knees until they come over your hips. Be certain not to hold your breath.  Hold __________ seconds. Using your abdominal muscles, return to the starting position in a slow and controlled manner. Repeat __________ times. Complete this exercise __________ times per day.  POSTURE AND BODY MECHANICS CONSIDERATIONS - Low Back Strain Keeping correct posture when sitting, standing or completing your activities will reduce the stress put on different body tissues, allowing injured tissues a chance to heal and limiting painful experiences. The following are general guidelines for improved posture. Your physician or physical therapist will provide you with any instructions specific to your needs. While reading these guidelines, remember:  The exercises prescribed by your provider will help you have the flexibility and strength to maintain correct postures.  The correct posture provides the best environment for your joints to work. All of your joints have less wear and tear when properly supported by a spine with good posture. This means you will experience a healthier, less painful body.  Correct  posture must be practiced with all of your activities, especially prolonged sitting and standing. Correct posture is as important when doing repetitive low-stress activities (typing) as it is when doing a single heavy-load activity (lifting). RESTING POSITIONS Consider which positions are most painful for you when choosing a resting position. If you have pain  with flexion-based activities (sitting, bending, stooping, squatting), choose a position that allows you to rest in a less flexed posture. You would want to avoid curling into a fetal position on your side. If your pain worsens with extension-based activities (prolonged standing, working overhead), avoid resting in an extended position such as sleeping on your stomach. Most people will find more comfort when they rest with their spine in a more neutral position, neither too rounded nor too arched. Lying on a non-sagging bed on your side with a pillow between your knees, or on your back with a pillow under your knees will often provide some relief. Keep in mind, being in any one position for a prolonged period of time, no matter how correct your posture, can still lead to stiffness. PROPER SITTING POSTURE In order to minimize stress and discomfort on your spine, you must sit with correct posture. Sitting with good posture should be effortless for a healthy body. Returning to good posture is a gradual process. Many people can work toward this most comfortably by using various supports until they have the flexibility and strength to maintain this posture on their own. When sitting with proper posture, your ears will fall over your shoulders and your shoulders will fall over your hips. You should use the back of the chair to support your upper back. Your lower back will be in a neutral position, just slightly arched. You may place a small pillow or folded towel at the base of your lower back for support.  When working at a desk, create an environment that supports good, upright posture. Without extra support, muscles tire, which leads to excessive strain on joints and other tissues. Keep these recommendations in mind: CHAIR:  A chair should be able to slide under your desk when your back makes contact with the back of the chair. This allows you to work closely.  The chair's height should allow your eyes to be  level with the upper part of your monitor and your hands to be slightly lower than your elbows. BODY POSITION  Your feet should make contact with the floor. If this is not possible, use a foot rest.  Keep your ears over your shoulders. This will reduce stress on your neck and lower back. INCORRECT SITTING POSTURES  If you are feeling tired and unable to assume a healthy sitting posture, do not slouch or slump. This puts excessive strain on your back tissues, causing more damage and pain. Healthier options include:  Using more support, like a lumbar pillow.  Switching tasks to something that requires you to be upright or walking.  Talking a brief walk.  Lying down to rest in a neutral-spine position. PROLONGED STANDING WHILE SLIGHTLY LEANING FORWARD  When completing a task that requires you to lean forward while standing in one place for a long time, place either foot up on a stationary 2-4 inch high object to help maintain the best posture. When both feet are on the ground, the lower back tends to lose its slight inward curve. If this curve flattens (or becomes too large), then the back and your other joints will experience too much stress, tire more quickly,  and can cause pain. CORRECT STANDING POSTURES Proper standing posture should be assumed with all daily activities, even if they only take a few moments, like when brushing your teeth. As in sitting, your ears should fall over your shoulders and your shoulders should fall over your hips. You should keep a slight tension in your abdominal muscles to brace your spine. Your tailbone should point down to the ground, not behind your body, resulting in an over-extended swayback posture.  INCORRECT STANDING POSTURES  Common incorrect standing postures include a forward head, locked knees and/or an excessive swayback. WALKING Walk with an upright posture. Your ears, shoulders and hips should all line-up. PROLONGED ACTIVITY IN A FLEXED  POSITION When completing a task that requires you to bend forward at your waist or lean over a low surface, try to find a way to stabilize 3 out of 4 of your limbs. You can place a hand or elbow on your thigh or rest a knee on the surface you are reaching across. This will provide you more stability so that your muscles do not fatigue as quickly. By keeping your knees relaxed, or slightly bent, you will also reduce stress across your lower back. CORRECT LIFTING TECHNIQUES DO :   Assume a wide stance. This will provide you more stability and the opportunity to get as close as possible to the object which you are lifting.  Tense your abdominals to brace your spine. Bend at the knees and hips. Keeping your back locked in a neutral-spine position, lift using your leg muscles. Lift with your legs, keeping your back straight.  Test the weight of unknown objects before attempting to lift them.  Try to keep your elbows locked down at your sides in order get the best strength from your shoulders when carrying an object.  Always ask for help when lifting heavy or awkward objects. INCORRECT LIFTING TECHNIQUES DO NOT:   Lock your knees when lifting, even if it is a small object.  Bend and twist. Pivot at your feet or move your feet when needing to change directions.  Assume that you can safely pick up even a paper clip without proper posture.   This information is not intended to replace advice given to you by your health care provider. Make sure you discuss any questions you have with your health care provider.   Document Released: 07/06/2005 Document Revised: 07/27/2014 Document Reviewed: 10/18/2008 Elsevier Interactive Patient Education 2016 Elsevier Inc.  Lumbosacral Strain Lumbosacral strain is a strain of any of the parts that make up your lumbosacral vertebrae. Your lumbosacral vertebrae are the bones that make up the lower third of your backbone. Your lumbosacral vertebrae are held together  by muscles and tough, fibrous tissue (ligaments).  CAUSES  A sudden blow to your back can cause lumbosacral strain. Also, anything that causes an excessive stretch of the muscles in the low back can cause this strain. This is typically seen when people exert themselves strenuously, fall, lift heavy objects, bend, or crouch repeatedly. RISK FACTORS  Physically demanding work.  Participation in pushing or pulling sports or sports that require a sudden twist of the back (tennis, golf, baseball).  Weight lifting.  Excessive lower back curvature.  Forward-tilted pelvis.  Weak back or abdominal muscles or both.  Tight hamstrings. SIGNS AND SYMPTOMS  Lumbosacral strain may cause pain in the area of your injury or pain that moves (radiates) down your leg.  DIAGNOSIS Your health care provider can often diagnose lumbosacral strain through  a physical exam. In some cases, you may need tests such as X-ray exams.  TREATMENT  Treatment for your lower back injury depends on many factors that your clinician will have to evaluate. However, most treatment will include the use of anti-inflammatory medicines. HOME CARE INSTRUCTIONS   Avoid hard physical activities (tennis, racquetball, waterskiing) if you are not in proper physical condition for it. This may aggravate or create problems.  If you have a back problem, avoid sports requiring sudden body movements. Swimming and walking are generally safer activities.  Maintain good posture.  Maintain a healthy weight.  For acute conditions, you may put ice on the injured area.  Put ice in a plastic bag.  Place a towel between your skin and the bag.  Leave the ice on for 20 minutes, 2-3 times a day.  When the low back starts healing, stretching and strengthening exercises may be recommended. SEEK MEDICAL CARE IF:  Your back pain is getting worse.  You experience severe back pain not relieved with medicines. SEEK IMMEDIATE MEDICAL CARE IF:    You have numbness, tingling, weakness, or problems with the use of your arms or legs.  There is a change in bowel or bladder control.  You have increasing pain in any area of the body, including your belly (abdomen).  You notice shortness of breath, dizziness, or feel faint.  You feel sick to your stomach (nauseous), are throwing up (vomiting), or become sweaty.  You notice discoloration of your toes or legs, or your feet get very cold. MAKE SURE YOU:   Understand these instructions.  Will watch your condition.  Will get help right away if you are not doing well or get worse.   This information is not intended to replace advice given to you by your health care provider. Make sure you discuss any questions you have with your health care provider.   Document Released: 04/15/2005 Document Revised: 07/27/2014 Document Reviewed: 02/22/2013 Elsevier Interactive Patient Education [employer].

## 2015-10-31 ENCOUNTER — Ambulatory Visit: Payer: Medicaid Other | Admitting: Family Medicine

## 2015-11-06 ENCOUNTER — Emergency Department (HOSPITAL_COMMUNITY)
Admission: EM | Admit: 2015-11-06 | Discharge: 2015-11-06 | Disposition: A | Discharge status: Home / Self Care | Payer: Medicaid Other | Attending: Emergency Medicine | Admitting: Emergency Medicine

## 2015-11-06 ENCOUNTER — Encounter (HOSPITAL_COMMUNITY): Payer: Self-pay | Admitting: *Deleted

## 2015-11-06 DIAGNOSIS — R112 Nausea with vomiting, unspecified: Secondary | ICD-10-CM | POA: Diagnosis not present

## 2015-11-06 DIAGNOSIS — R1012 Left upper quadrant pain: Secondary | ICD-10-CM | POA: Diagnosis not present

## 2015-11-06 DIAGNOSIS — Z79899 Other long term (current) drug therapy: Secondary | ICD-10-CM | POA: Insufficient documentation

## 2015-11-06 DIAGNOSIS — F17200 Nicotine dependence, unspecified, uncomplicated: Secondary | ICD-10-CM | POA: Insufficient documentation

## 2015-11-06 DIAGNOSIS — R1010 Upper abdominal pain, unspecified: Secondary | ICD-10-CM | POA: Diagnosis present

## 2015-11-06 LAB — COMPREHENSIVE METABOLIC PANEL
ALBUMIN: 4.2 g/dL (ref 3.5–5.0)
ALK PHOS: 42 U/L (ref 38–126)
ALT: 17 U/L (ref 17–63)
ANION GAP: 10 (ref 5–15)
AST: 16 U/L (ref 15–41)
BUN: 8 mg/dL (ref 6–20)
CO2: 24 mmol/L (ref 22–32)
Calcium: 9.2 mg/dL (ref 8.9–10.3)
Chloride: 105 mmol/L (ref 101–111)
Creatinine, Ser: 0.99 mg/dL (ref 0.61–1.24)
GFR calc Af Amer: >60 mL/min (ref >60)
GFR calc non Af Amer: >60 mL/min (ref >60)
GLUCOSE: 113 mg/dL — AB (ref 65–99)
POTASSIUM: 3.7 mmol/L (ref 3.5–5.1)
SODIUM: 139 mmol/L (ref 135–145)
Total Bilirubin: 0.8 mg/dL (ref 0.3–1.2)
Total Protein: 6.9 g/dL (ref 6.5–8.1)

## 2015-11-06 LAB — CBC WITH DIFFERENTIAL/PLATELET
Basophils Absolute: 0 10*3/uL (ref 0.0–0.1)
Basophils Relative: 0 %
Eosinophils Absolute: 0 10*3/uL (ref 0.0–0.7)
Eosinophils Relative: 0 %
HEMATOCRIT: 42 % (ref 39.0–52.0)
HEMOGLOBIN: 14.6 g/dL (ref 13.0–17.0)
LYMPHS ABS: 2.1 10*3/uL (ref 0.7–4.0)
Lymphocytes Relative: 30 %
MCH: 31.3 pg (ref 26.0–34.0)
MCHC: 34.8 g/dL (ref 30.0–36.0)
MCV: 90.1 fL (ref 78.0–100.0)
MONOS PCT: 6 %
Monocytes Absolute: 0.4 10*3/uL (ref 0.1–1.0)
NEUTROS ABS: 4.6 10*3/uL (ref 1.7–7.7)
NEUTROS PCT: 64 %
Platelets: 138 10*3/uL — ABNORMAL LOW (ref 150–400)
RBC: 4.66 MIL/uL (ref 4.22–5.81)
RDW: 12.6 % (ref 11.5–15.5)
WBC: 7.2 10*3/uL (ref 4.0–10.5)

## 2015-11-06 LAB — LIPASE, BLOOD: Lipase: 23 U/L (ref 11–51)

## 2015-11-06 MED ORDER — ONDANSETRON 4 MG PO TBDP
4.0000 mg | ORAL_TABLET | Freq: Once | ORAL | Status: AC
Start: 2015-11-06 — End: 2015-11-06
  Administered 2015-11-06: 4 mg via ORAL
  Filled 2015-11-06: qty 1

## 2015-11-06 MED ORDER — DICYCLOMINE HCL 10 MG/ML IM SOLN
10.0000 mg | Freq: Once | INTRAMUSCULAR | Status: AC
  Administered 2015-11-06: 10 mg via INTRAMUSCULAR
  Filled 2015-11-06: qty 2

## 2015-11-06 MED ORDER — DICYCLOMINE HCL 20 MG PO TABS: 20.0000 mg | ORAL_TABLET | Freq: Two times a day (BID) | ORAL | Status: DC | PRN

## 2015-11-06 NOTE — ED Notes (Signed)
Pt here via EMS- Pt speaks no English, but MS reports left upper quadrant abdominal pain. Pt denies n/v/d. Last BM today.

## 2015-11-06 NOTE — ED Provider Notes (Signed)
CSN: [id number]     Arrival date & time 11/06/15  1812 History   First MD Initiated Contact with Patient 11/06/15 1818     Chief Complaint  Patient presents with  . Abdominal Pain     Patient speaks Arabic and was translated using the translator phones.   Patient is a 30 y.o. male presenting with abdominal pain. The history is provided by the patient.  Abdominal Pain Associated symptoms: nausea and vomiting   Associated symptoms: no chest pain, no chills, no diarrhea and no fever   patient presents with upper abdominal pain. His in his left upper abdomen sharp. Reportedly has had episodes of this for while. This episode began acutely this afternoon. He has had H. pylori positive in the past and has been treated for it. He had some nausea and vomiting after. No fevers. No diarrhea. States he did have some vomiting after the pain began. No headache. No confusion. No vomiting of blood. He is not seen a gastroenterologist or had a scope but is apparently had evaluations for this in the past.  History reviewed. No pertinent past medical history. Past Surgical History  Procedure Laterality Date  . Appendectomy     No family history on file. Social History  Substance Use Topics  . Smoking status: Current Every Day Smoker  . Smokeless tobacco: None  . Alcohol Use: No    Review of Systems  Constitutional: Negative for fever, chills, activity change and appetite change.  Eyes: Negative for pain.  Respiratory: Negative for chest tightness.   Cardiovascular: Negative for chest pain and leg swelling.  Gastrointestinal: Positive for nausea, vomiting and abdominal pain. Negative for diarrhea.  Genitourinary: Negative for flank pain.  Musculoskeletal: Negative for back pain.  Skin: Negative for rash.  Neurological: Negative for weakness and headaches.      Allergies  Review of patient's allergies indicates no known allergies.  Home Medications   Prior to Admission medications    Medication Sig Start Date End Date Taking? Authorizing Provider  omeprazole (PRILOSEC) 20 MG capsule TAKE ONE CAPSULE BY MOUTH TWICE DAILY BEFORE A MEAL 08/19/15  Yes Tobey GrimJeffrey H Walden, MD  diclofenac sodium (VOLTAREN) 1 % GEL Apply 1 application topically 4 (four) times daily. Patient not taking: Reported on 11/06/2015 09/26/15   Hayden Rasmussenavid Mabe, NP  dicyclomine (BENTYL) 20 MG tablet Take 1 tablet (20 mg total) by mouth 2 (two) times daily as needed for spasms. 11/06/15   Benjiman CoreNathan Labrisha Wuellner, MD  naproxen (NAPROSYN) 375 MG tablet Take 1 tablet (375 mg total) by mouth 2 (two) times daily. Patient not taking: Reported on 11/06/2015 09/26/15   Hayden Rasmussenavid Mabe, NP  sucralfate (CARAFATE) 1 G tablet Take 1 tablet (1 g total) by mouth 4 (four) times daily -  with meals and at bedtime. Patient not taking: Reported on 11/06/2015 07/04/15   Hayden Rasmussenavid Mabe, NP   BP 103/63 mmHg  Pulse 56  Temp(Src) 98 F (36.7 C) (Oral)  Resp 21  SpO2 100% Physical Exam  Constitutional: He appears well-developed and well-nourished.  HENT:  Head: Atraumatic.  Eyes: EOM are normal.  Neck: Neck supple.  Cardiovascular: Normal rate.   Pulmonary/Chest: Effort normal.  Abdominal: Soft. There is no tenderness. There is no rebound and no guarding.  Musculoskeletal: Normal range of motion. He exhibits no tenderness.  Neurological: He is alert.  Skin: Skin is warm.    ED Course  Procedures (including critical care time) Labs Review Labs Reviewed  COMPREHENSIVE METABOLIC PANEL - Abnormal;  Notable for the following:    Glucose, Bld 113 (*)    All other components within normal limits  CBC WITH DIFFERENTIAL/PLATELET - Abnormal; Notable for the following:    Platelets 138 (*)    All other components within normal limits  LIPASE, BLOOD    Imaging Review No results found. I have personally reviewed and evaluated these images and lab results as part of my medical decision-making.   EKG Interpretation   Date/Time:  Wednesday November 06 2015 18:13:27 EDT Ventricular Rate:  69 PR Interval:  153 QRS Duration: 91 QT Interval:  401 QTC Calculation: 430 R Axis:   53 Text Interpretation:  Sinus rhythm ST elev, probable normal early repol  pattern Confirmed by Rubin Payor  MD, Marriana Hibberd [phone]) on 11/06/2015 6:18:49 PM      MDM   Final diagnoses:  Left upper quadrant pain    Patient with acute on chronic abdominal pain. Labs reassuring. D/c with Bentyl and Gi follow up.    Benjiman Core, MD 11/06/15 2220

## 2015-11-06 NOTE — ED Notes (Signed)
Pt reports chronic abd pain onset since age 30, pt reports pain at all times, not worsening after eating, pt denies v/d, pt denies family history of GI problems, reports worsening pain upon arrival to the [place] x 7 mths ago

## 2015-11-06 NOTE — Discharge Instructions (Signed)

## 2015-11-09 ENCOUNTER — Encounter (HOSPITAL_COMMUNITY): Payer: Self-pay | Admitting: *Deleted

## 2015-11-09 ENCOUNTER — Emergency Department (HOSPITAL_COMMUNITY)
Admission: EM | Admit: 2015-11-09 | Discharge: 2015-11-09 | Disposition: A | Discharge status: Home / Self Care | Payer: Medicaid Other | Attending: Emergency Medicine | Admitting: Emergency Medicine

## 2015-11-09 DIAGNOSIS — Z9049 Acquired absence of other specified parts of digestive tract: Secondary | ICD-10-CM | POA: Insufficient documentation

## 2015-11-09 DIAGNOSIS — R110 Nausea: Secondary | ICD-10-CM | POA: Insufficient documentation

## 2015-11-09 DIAGNOSIS — R1013 Epigastric pain: Secondary | ICD-10-CM | POA: Diagnosis not present

## 2015-11-09 DIAGNOSIS — F17200 Nicotine dependence, unspecified, uncomplicated: Secondary | ICD-10-CM | POA: Diagnosis not present

## 2015-11-09 DIAGNOSIS — G8929 Other chronic pain: Secondary | ICD-10-CM | POA: Diagnosis not present

## 2015-11-09 LAB — CBC
HCT: 42.8 % (ref 39.0–52.0)
Hemoglobin: 14.5 g/dL (ref 13.0–17.0)
MCH: 31.1 pg (ref 26.0–34.0)
MCHC: 33.9 g/dL (ref 30.0–36.0)
MCV: 91.8 fL (ref 78.0–100.0)
PLATELETS: 138 10*3/uL — AB (ref 150–400)
RBC: 4.66 MIL/uL (ref 4.22–5.81)
RDW: 12.6 % (ref 11.5–15.5)
WBC: 8.7 10*3/uL (ref 4.0–10.5)

## 2015-11-09 LAB — COMPREHENSIVE METABOLIC PANEL
ALBUMIN: 3.9 g/dL (ref 3.5–5.0)
ALK PHOS: 42 U/L (ref 38–126)
ALT: 14 U/L — ABNORMAL LOW (ref 17–63)
AST: 16 U/L (ref 15–41)
Anion gap: 9 (ref 5–15)
BUN: 8 mg/dL (ref 6–20)
CHLORIDE: 104 mmol/L (ref 101–111)
CO2: 25 mmol/L (ref 22–32)
CREATININE: 1.01 mg/dL (ref 0.61–1.24)
Calcium: 9.1 mg/dL (ref 8.9–10.3)
GFR calc Af Amer: >60 mL/min (ref >60)
Glucose, Bld: 159 mg/dL — ABNORMAL HIGH (ref 65–99)
Potassium: 3.9 mmol/L (ref 3.5–5.1)
SODIUM: 138 mmol/L (ref 135–145)
Total Bilirubin: 0.4 mg/dL (ref 0.3–1.2)
Total Protein: 7 g/dL (ref 6.5–8.1)

## 2015-11-09 LAB — LIPASE, BLOOD: Lipase: 24 U/L (ref 11–51)

## 2015-11-09 MED ORDER — OMEPRAZOLE 20 MG PO CPDR: 20.0000 mg | DELAYED_RELEASE_CAPSULE | Freq: Every day | ORAL | Status: DC

## 2015-11-09 NOTE — ED Provider Notes (Signed)
CSN: [id number]     Arrival date & time 11/09/15  1634 History   First MD Initiated Contact with Patient 11/09/15 1755     Chief Complaint  Patient presents with  . Abdominal Pain     (Consider location/radiation/quality/duration/timing/severity/associated sxs/prior Treatment) HPI Daniel Nelson is a 30 y.o. male with a history of appendectomy, chronic abdominal pain, comes in for evaluation of acute abdominal pain. Patient reports he was seen in the emergency department 3 days ago for similar symptoms, given Bentyl and follow-up with GI. He could not get an appointment with GI for 2 months and came back to emergency department. He reports he has had intermittent abdominal pain in his epigastrium over the past 2 years, worse after drinking coffee and eating. Will sometimes have nausea, but no vomiting. It is nonexertional, no shortness of breath, numbness or weakness. Has not tried anything to improve his symptoms. No fevers, urinary symptoms, diarrhea or constipation, back pain or other GU complaints.  History reviewed. No pertinent past medical history. Past Surgical History  Procedure Laterality Date  . Appendectomy     History reviewed. No pertinent family history. Social History  Substance Use Topics  . Smoking status: Current Every Day Smoker  . Smokeless tobacco: None  . Alcohol Use: No    Review of Systems A 10 point review of systems was completed and was negative except for pertinent positives and negatives as mentioned in the history of present illness     Allergies  Review of patient's allergies indicates no known allergies.  Home Medications   Prior to Admission medications   Medication Sig Start Date End Date Taking? Authorizing Provider  diclofenac sodium (VOLTAREN) 1 % GEL Apply 1 application topically 4 (four) times daily. Patient not taking: Reported on 11/06/2015 09/26/15   Hayden Rasmussenavid Mabe, NP  dicyclomine (BENTYL) 20 MG tablet Take 1 tablet (20 mg total) by  mouth 2 (two) times daily as needed for spasms. 11/06/15   Benjiman CoreNathan Pickering, MD  naproxen (NAPROSYN) 375 MG tablet Take 1 tablet (375 mg total) by mouth 2 (two) times daily. Patient not taking: Reported on 11/06/2015 09/26/15   Hayden Rasmussenavid Mabe, NP  omeprazole (PRILOSEC) 20 MG capsule Take 1 capsule (20 mg total) by mouth daily. 11/09/15   Joycie PeekBenjamin Lenora Gomes, PA-C  sucralfate (CARAFATE) 1 G tablet Take 1 tablet (1 g total) by mouth 4 (four) times daily -  with meals and at bedtime. Patient not taking: Reported on 11/06/2015 07/04/15   Hayden Rasmussenavid Mabe, NP   BP 121/77 mmHg  Pulse 57  Temp(Src) 98 F (36.7 C) (Oral)  Resp 20  Wt 74.844 kg  SpO2 100% Physical Exam  Constitutional: He is oriented to person, place, and time. He appears well-developed and well-nourished.  Very well appearing, no apparent distress.  HENT:  Head: Normocephalic and atraumatic.  Mouth/Throat: Oropharynx is clear and moist.  Eyes: Conjunctivae are normal. Pupils are equal, round, and reactive to light. Right eye exhibits no discharge. Left eye exhibits no discharge. No scleral icterus.  Neck: Neck supple.  Cardiovascular: Normal rate, regular rhythm and normal heart sounds.   Pulmonary/Chest: Effort normal and breath sounds normal. No respiratory distress. He has no wheezes. He has no rales.  Abdominal: Soft.  Mild tenderness with deep palpation in epigastrium. No other focal tenderness. Abdomen otherwise soft, nondistended without rebound or guarding.  Musculoskeletal: He exhibits no tenderness.  Neurological: He is alert and oriented to person, place, and time.  Cranial Nerves II-XII grossly intact  Skin: Skin is warm and dry. No rash noted.  Psychiatric: He has a normal mood and affect.  Nursing note and vitals reviewed.   ED Course  Procedures (including critical care time) Labs Review Labs Reviewed  COMPREHENSIVE METABOLIC PANEL - Abnormal; Notable for the following:    Glucose, Bld 159 (*)    ALT 14 (*)    All other  components within normal limits  CBC - Abnormal; Notable for the following:    Platelets 138 (*)    All other components within normal limits  LIPASE, BLOOD  URINALYSIS, ROUTINE W REFLEX MICROSCOPIC (NOT AT [facility])    Imaging Review No results found. I have personally reviewed and evaluated these images and lab results as part of my medical decision-making.   EKG Interpretation None     Meds given in ED:  Medications - No data to display  New Prescriptions   OMEPRAZOLE (PRILOSEC) 20 MG CAPSULE    Take 1 capsule (20 mg total) by mouth daily.   Filed Vitals:   11/09/15 1639  BP: 121/77  Pulse: 57  Temp: 98 F (36.7 C)  TempSrc: Oral  Resp: 20  Weight: 74.844 kg  SpO2: 100%    MDM  Daniel Nelson is a 30 y.o. male with a history of appendectomy, presents for acute on chronic abdominal pain. Mild epigastric discomfort with deep palpation, no other evidence of acute abdomen. Symptoms worse after eating, suspect GERD versus other gastritis. No RUQ pain, unremarkable LFTs. Doubt acute biliary process. Given referral to GI for definitive care. Encouraged continued use of previously prescribed Bentyl, dietary modification. States he does not take any other medications. We will initiate PPI therapy. Return precautions discussed and he understands that he needs to return if symptoms worsen or change. The patient appears reasonably screened and/or stabilized for discharge and I doubt any other medical condition or other [facility] requiring further screening, evaluation, or treatment in the ED at this time prior to discharge.   Final diagnoses:  Epigastric discomfort        Joycie Peek, PA-C 11/09/15 1920  Lyndal Pulley, MD 11/10/15 [phone]

## 2015-11-09 NOTE — ED Notes (Signed)
Pt was here on 4/19 for same. Reports being given medications which did not help. Having mid abd pain x 3 days with n/v. Denies diarrhea. Feels weak and lightheaded. No acute distress noted at triage.

## 2015-11-09 NOTE — ED Notes (Signed)
Pt verbalized understanding of d/c instructions, prescriptions, and follow-up care. No further questions/concerns, VSS, ambulatory w/ steady gait (refused wheelchair) 

## 2015-11-11 ENCOUNTER — Encounter: Payer: Self-pay | Admitting: Internal Medicine

## 2015-11-11 ENCOUNTER — Ambulatory Visit (INDEPENDENT_AMBULATORY_CARE_PROVIDER_SITE_OTHER): Payer: Medicaid Other | Admitting: Internal Medicine

## 2015-11-11 ENCOUNTER — Other Ambulatory Visit: Payer: Self-pay | Admitting: Internal Medicine

## 2015-11-11 VITALS — BP 110/62 | HR 59 | Temp 98.0°F | Wt 168.3 lb

## 2015-11-11 DIAGNOSIS — A048 Other specified bacterial intestinal infections: Secondary | ICD-10-CM

## 2015-11-11 DIAGNOSIS — B9681 Helicobacter pylori [H. pylori] as the cause of diseases classified elsewhere: Secondary | ICD-10-CM

## 2015-11-11 DIAGNOSIS — J302 Other seasonal allergic rhinitis: Secondary | ICD-10-CM

## 2015-11-11 MED ORDER — OMEPRAZOLE 20 MG PO CPDR: 20.0000 mg | DELAYED_RELEASE_CAPSULE | Freq: Two times a day (BID) | ORAL | Status: DC

## 2015-11-11 MED ORDER — CLARITHROMYCIN 500 MG PO TABS
500.0000 mg | ORAL_TABLET | Freq: Two times a day (BID) | ORAL | Status: DC
Start: 2015-11-11 — End: 2016-01-07

## 2015-11-11 MED ORDER — FLUTICASONE PROPIONATE 50 MCG/ACT NA SUSP: 2.0000 | Freq: Every day | NASAL | Status: DC

## 2015-11-11 MED ORDER — AMOXICILLIN 500 MG PO CAPS: 1000.0000 mg | ORAL_CAPSULE | Freq: Two times a day (BID) | ORAL | Status: DC

## 2015-11-11 MED ORDER — AMOXICILLIN 500 MG PO CAPS: 500.0000 mg | ORAL_CAPSULE | Freq: Two times a day (BID) | ORAL | Status: DC

## 2015-11-11 MED ORDER — LORATADINE 10 MG PO TABS: 10.0000 mg | ORAL_TABLET | Freq: Every day | ORAL | Status: DC | PRN

## 2015-11-11 NOTE — Assessment & Plan Note (Addendum)
-   Prescribed flonase and loratadine for symptom relief.

## 2015-11-11 NOTE — Progress Notes (Signed)
Subjective:    Patient ID: Effie ShyMohamad Nour Dorval, male    DOB: 08/28/1985, 28 y.o.   MRN: [id number]  HPI  Mr. Sabas is a 30-y.o. male with history of abdominal pain and dizziness who presents for 3 days of congestion. Visit conducted with help of Arabic video interpreter Najwa [phone].  For the past 3 days, he has had "inflamed tonsils" with sore throat, rhinorrhea, nasal congestion, neck pain with flexion/extension that travels down his left arm. Symptoms are also associated with headache. He says his eyes have been red and vision becomes blurry with tearing when he is exposed to cold weather. He denies previous history of seasonal allergies when he was living in [place]. Patient reports that he had sinus surgery in [place] with removal of adenoids and previous use of nasal spray. He has not tried anything for his current symptoms.  In addition, he has had at least 3 episodes of abdominal pain with associated dizzy spells, for which he has been seen in the ED. He is taking omeprazole with some improvement. He tested positive for H. pylori in Jan 2017. He denies ever undergoing treatment. He has an appointment with GI for endoscopy 5/20. He denies hematochezia or hemoptysis. He gets dizziness when his abdominal pain gets to be a 5 or higher on the pain scale.   Review of Systems  Constitutional: Negative for fever.  HENT: Positive for ear pain, rhinorrhea and sore throat. Negative for sinus pressure.   Eyes: Positive for redness.  Respiratory: Negative for cough and shortness of breath.   Cardiovascular: Negative for chest pain.  Gastrointestinal: Negative for nausea, vomiting and diarrhea.  Musculoskeletal: Positive for neck pain.  Skin: Negative for rash.  Neurological: Positive for headaches.   Social: Former smoker    Objective: Blood pressure 110/62, pulse 59, temperature 98 F (36.7 C), temperature source Oral, weight 168 lb 4.8 oz (76.34 kg), SpO2 99 %.   Physical Exam    Constitutional: He is oriented to person, place, and time. He appears well-developed and well-nourished. No distress.  HENT:  Right Ear: External ear normal.  Left Ear: External ear normal.  Oropharynx with slight erythema but no lesions or exudates. Erythema and swelling of nasal turbinates. Mild nasal congestion present.  Eyes: Conjunctivae and EOM are normal. Pupils are equal, round, and reactive to light. Right eye exhibits no discharge. Left eye exhibits no discharge.  Neck: Normal range of motion. Neck supple.  Cardiovascular: Normal rate, regular rhythm and normal heart sounds.  Exam reveals no gallop and no friction rub.   No murmur heard. Pulmonary/Chest: Effort normal and breath sounds normal. No respiratory distress. He has no wheezes. He exhibits no tenderness.  Abdominal: Soft. Bowel sounds are normal. He exhibits no distension and no mass. There is tenderness (TTP over epigastric region and umbilical region). There is no rebound and no guarding.  Lymphadenopathy:    He has no cervical adenopathy.  Neurological: He is alert and oriented to person, place, and time.  Skin: Skin is warm and dry.      Assessment & Plan:  Mr. Bruce Donathlhafyan is a 30-y.o. male with symptoms consistent with allergic rhinitis. Abdominal pain continues on omeprazole. H. pylori screen was positive 07/31/2015, and patient denies ever receiving treatment. Endoscopy appointment in May appropriate. Gastric ulcer could be contributing to dizziness. Hgb from 11/09/15 reassuring at 14.5.   H. pylori infection - Triple therapy with omeprazole 20 mg BID, amoxicillin 1 g BID, and clarithromycin 500 mg BID  for 2 weeks.  Seasonal allergies - Prescribed flonase and loratadine for symptom relief.    Dani Gobble, MD [facility], PGY-1

## 2015-11-11 NOTE — Assessment & Plan Note (Signed)
-   Triple therapy with omeprazole 20 mg BID, amoxicillin 1 g BID, and clarithromycin 500 mg BID for 2 weeks.

## 2015-11-11 NOTE — Patient Instructions (Signed)
Mr. Bruce Donathlhafyan,  For your stomach pain, please take the following medications twice a day for 2 weeks: omeprazole 20 mg, amoxicillin 1 g, and clarithromycin 500 mg. This is treatment for a bacteria called H. Pylori that can cause stomach ulcers.   I will call the gastroenterologist to see if your appointment can be moved up.  For allergies, please try flonase nasal spray. Claritin can be helpful in the short-term, but I would not use it for longer than you have symptoms.  Thank you, Dr. Sampson GoonFitzgerald              2 :  20   1   500 .      H.       .            .       .                Shawnie Dapper.     alsyd alhifyan, li'alam almueddati, yrja 'Spero Geraldsakhadh al'adwiat alttaliat marratayn yawmiaan limuddat 2 'asabie: 'awmibrazul 20 malagh, 'umuksisilin 1 gharama, walkularithrumisin 500 malagh. hadha hu eilaj libakatiriaa tusamma H. biluri alty ymkn 'an tusabbib qarhat almaedat. sawf 'adeu aljihaz alhadmi limaerifat ma 'iidha kan yumkin naql maweidik. lahisasiat, yrja muhawalat fulunas ridhadh al'anfa. klaryatayn ymkn 'an takun mufidat fi almadaa alqasiri, walakun ln tastakhdimah lifatrat 'atwal mimma ladayk 'aerad. shukraan, aldduktur fytzjyrald

## 2015-11-16 ENCOUNTER — Encounter (HOSPITAL_COMMUNITY): Payer: Self-pay

## 2015-11-16 ENCOUNTER — Emergency Department (HOSPITAL_COMMUNITY)
Admission: EM | Admit: 2015-11-16 | Discharge: 2015-11-16 | Disposition: A | Discharge status: Home / Self Care | Payer: Medicaid Other | Attending: Emergency Medicine | Admitting: Emergency Medicine

## 2015-11-16 DIAGNOSIS — R109 Unspecified abdominal pain: Secondary | ICD-10-CM | POA: Diagnosis present

## 2015-11-16 DIAGNOSIS — Z7951 Long term (current) use of inhaled steroids: Secondary | ICD-10-CM | POA: Diagnosis not present

## 2015-11-16 DIAGNOSIS — Z792 Long term (current) use of antibiotics: Secondary | ICD-10-CM | POA: Diagnosis not present

## 2015-11-16 DIAGNOSIS — Z87891 Personal history of nicotine dependence: Secondary | ICD-10-CM | POA: Insufficient documentation

## 2015-11-16 DIAGNOSIS — A045 Campylobacter enteritis: Secondary | ICD-10-CM | POA: Diagnosis not present

## 2015-11-16 DIAGNOSIS — R110 Nausea: Secondary | ICD-10-CM | POA: Diagnosis not present

## 2015-11-16 DIAGNOSIS — R42 Dizziness and giddiness: Secondary | ICD-10-CM | POA: Diagnosis not present

## 2015-11-16 DIAGNOSIS — Z79899 Other long term (current) drug therapy: Secondary | ICD-10-CM | POA: Diagnosis not present

## 2015-11-16 LAB — COMPREHENSIVE METABOLIC PANEL
ALT: 15 U/L — ABNORMAL LOW (ref 17–63)
AST: 20 U/L (ref 15–41)
Albumin: 4.4 g/dL (ref 3.5–5.0)
Alkaline Phosphatase: 50 U/L (ref 38–126)
Anion gap: 12 (ref 5–15)
BUN: 11 mg/dL (ref 6–20)
CHLORIDE: 103 mmol/L (ref 101–111)
CO2: 22 mmol/L (ref 22–32)
Calcium: 9.7 mg/dL (ref 8.9–10.3)
Creatinine, Ser: 0.98 mg/dL (ref 0.61–1.24)
Glucose, Bld: 125 mg/dL — ABNORMAL HIGH (ref 65–99)
POTASSIUM: 3.7 mmol/L (ref 3.5–5.1)
Sodium: 137 mmol/L (ref 135–145)
Total Bilirubin: 1 mg/dL (ref 0.3–1.2)
Total Protein: 7.8 g/dL (ref 6.5–8.1)

## 2015-11-16 LAB — CBC
HEMATOCRIT: 47.3 % (ref 39.0–52.0)
Hemoglobin: 16.2 g/dL (ref 13.0–17.0)
MCH: 31.5 pg (ref 26.0–34.0)
MCHC: 34.2 g/dL (ref 30.0–36.0)
MCV: 92 fL (ref 78.0–100.0)
PLATELETS: 185 10*3/uL (ref 150–400)
RBC: 5.14 MIL/uL (ref 4.22–5.81)
RDW: 12.5 % (ref 11.5–15.5)
WBC: 8.5 10*3/uL (ref 4.0–10.5)

## 2015-11-16 LAB — LIPASE, BLOOD: LIPASE: 31 U/L (ref 11–51)

## 2015-11-16 LAB — I-STAT TROPONIN, ED: Troponin i, poc: 0 ng/mL (ref 0.00–0.08)

## 2015-11-16 NOTE — Discharge Instructions (Signed)
Hold bentyl.  Finish other medicines.  If you were given medicines take as directed.  If you are on coumadin or contraceptives realize their levels and effectiveness is altered by many different medicines.  If you have any reaction (rash, tongues swelling, other) to the medicines stop taking and see a physician.    If your blood pressure was elevated in the ER make sure you follow up for management with a primary doctor or return for chest pain, shortness of breath or stroke symptoms.  Please follow up as directed and return to the ER or see a physician for new or worsening symptoms.  Thank you. Filed Vitals:   11/16/15 1537 11/16/15 1648  BP: 144/93 123/75  Pulse: 104 74  Temp: 97.3 F (36.3 C)   TempSrc: Oral   Resp: 18 18  SpO2: 100% 100%

## 2015-11-16 NOTE — ED Notes (Signed)
Pt verbalized understanding of d/c instructions and has no further questions. Pt stable and NAD. Pt d/c home with family and is to follow up with pcp in 3 days.

## 2015-11-16 NOTE — ED Notes (Signed)
Patient here with abdominal cramping and generalized body aches. States that he felt worse after taking an antacid for same that was provided by friend. Patient appears anxious and in no distress

## 2015-11-16 NOTE — ED Provider Notes (Signed)
CSN: [id number]     Arrival date & time 11/16/15  1533 History   First MD Initiated Contact with Patient 11/16/15 1642     Chief Complaint  Patient presents with  . Abdominal Cramping     (Consider location/radiation/quality/duration/timing/severity/associated sxs/prior Treatment) HPI Comments: 30 year old male with history of H. pylori, allergies, currently on H. pylori treatment presents for abdominal cramping and dizziness. This started since he started taking Bentyl today. Patient's been on H. pylori treatment for the past 4-5 days. No blood in the stools. No vomiting. Patient feels improved with time. No other new meds. No other new foods.  Patient is a 30 y.o. male presenting with cramps. The history is provided by the patient.  Abdominal Cramping Pertinent negatives include no chest pain, no abdominal pain, no headaches and no shortness of breath.    History reviewed. No pertinent past medical history. Past Surgical History  Procedure Laterality Date  . Appendectomy     No family history on file. Social History  Substance Use Topics  . Smoking status: Former Games developer  . Smokeless tobacco: None  . Alcohol Use: No    Review of Systems  Constitutional: Negative for fever and chills.  HENT: Negative for congestion.   Eyes: Negative for visual disturbance.  Respiratory: Negative for shortness of breath.   Cardiovascular: Negative for chest pain.  Gastrointestinal: Positive for nausea. Negative for vomiting and abdominal pain.  Genitourinary: Negative for dysuria and flank pain.  Musculoskeletal: Negative for back pain, neck pain and neck stiffness.  Skin: Negative for rash.  Neurological: Positive for dizziness. Negative for light-headedness and headaches.      Allergies  Dicyclomine  Home Medications   Prior to Admission medications   Medication Sig Start Date End Date Taking? Authorizing Provider  amoxicillin (AMOXIL) 500 MG capsule Take 2 capsules (1,000 mg  total) by mouth 2 (two) times daily. 11/11/15  Yes Hillary Percell Boston, MD  clarithromycin (BIAXIN) 500 MG tablet Take 1 tablet (500 mg total) by mouth 2 (two) times daily. 11/11/15  Yes Hillary Percell Boston, MD  dicyclomine (BENTYL) 20 MG tablet Take 1 tablet (20 mg total) by mouth 2 (two) times daily as needed for spasms. 11/06/15  Yes Benjiman Core, MD  fluticasone (FLONASE) 50 MCG/ACT nasal spray Place 2 sprays into both nostrils daily. 11/11/15   Hillary Percell Boston, MD  loratadine (CLARITIN) 10 MG tablet Take 1 tablet (10 mg total) by mouth daily as needed for allergies. 11/11/15   Hillary Percell Boston, MD  omeprazole (PRILOSEC) 20 MG capsule TAKE 1 CAPSULE BY MOUTH TWICE DAILY 11/12/15   Tobey Grim, MD   BP 123/75 mmHg  Pulse 74  Temp(Src) 97.3 F (36.3 C) (Oral)  Resp 18  SpO2 100% Physical Exam  Constitutional: He is oriented to person, place, and time. He appears well-developed and well-nourished.  HENT:  Head: Normocephalic and atraumatic.  Eyes: Conjunctivae are normal. Right eye exhibits no discharge. Left eye exhibits no discharge.  Neck: Normal range of motion. Neck supple. No tracheal deviation present.  Cardiovascular: Normal rate and regular rhythm.   Pulmonary/Chest: Effort normal and breath sounds normal.  Abdominal: Soft. He exhibits no distension. There is no tenderness. There is no guarding.  Musculoskeletal: He exhibits no edema.  Neurological: He is alert and oriented to person, place, and time. No cranial nerve deficit.  Skin: Skin is warm. No rash noted.  Psychiatric: He has a normal mood and affect.  Nursing note and vitals reviewed.  ED Course  Procedures (including critical care time) Labs Review Labs Reviewed  COMPREHENSIVE METABOLIC PANEL - Abnormal; Notable for the following:    Glucose, Bld 125 (*)    ALT 15 (*)    All other components within normal limits  LIPASE, BLOOD  CBC  URINALYSIS, ROUTINE W REFLEX MICROSCOPIC (NOT AT  [facility])  I-STAT TROPOININ, ED    Imaging Review No results found. I have personally reviewed and evaluated these images and lab results as part of my medical decision-making.   EKG Interpretation   Date/Time:  Saturday November 16 2015 15:48:44 EDT Ventricular Rate:  87 PR Interval:  144 QRS Duration: 82 QT Interval:  360 QTC Calculation: 433 R Axis:   74 Text Interpretation:  Normal sinus rhythm with sinus arrhythmia Normal ECG  Confirmed by Airianna Kreischer  MD, Jashanti Clinkscale ([id number]) on 11/16/2015 5:02:09 PM      MDM   Final diagnoses:  Abdominal cramping   Well-appearing male currently being treated for H. pylori. Patient had screening blood work and EKG on arrival unremarkable reviewed. Patient has no focal tenderness, no vomiting or reported blood in the stools. Discussed holding Bentyl as this was the only association and follow-up with primary doctor this week.  Results and differential diagnosis were discussed with the patient/parent/guardian. Xrays were independently reviewed by myself.  Close follow up outpatient was discussed, comfortable with the plan.   Medications - No data to display  Filed Vitals:   11/16/15 1537 11/16/15 1648  BP: 144/93 123/75  Pulse: 104 74  Temp: 97.3 F (36.3 C)   TempSrc: Oral   Resp: 18 18  SpO2: 100% 100%    Final diagnoses:  Abdominal cramping        Blane OharaJoshua Rick Warnick, MD 11/16/15 1747

## 2015-11-22 ENCOUNTER — Encounter: Payer: Self-pay | Admitting: Family Medicine

## 2015-11-22 ENCOUNTER — Ambulatory Visit (INDEPENDENT_AMBULATORY_CARE_PROVIDER_SITE_OTHER): Payer: Medicaid Other | Admitting: Family Medicine

## 2015-11-22 VITALS — BP 116/67 | HR 60 | Temp 97.6°F | Ht 73.25 in | Wt 163.2 lb

## 2015-11-22 DIAGNOSIS — M791 Myalgia, unspecified site: Secondary | ICD-10-CM | POA: Insufficient documentation

## 2015-11-22 LAB — TSH: TSH: 1.34 m[IU]/L (ref 0.40–4.50)

## 2015-11-22 LAB — C-REACTIVE PROTEIN: CRP: <0.5 mg/dL (ref <0.60)

## 2015-11-22 LAB — CK: CK TOTAL: 96 U/L (ref 7–232)

## 2015-11-22 LAB — POCT SEDIMENTATION RATE: POCT SED RATE: 0 mm/h (ref 0–22)

## 2015-11-22 NOTE — Progress Notes (Signed)
Subjective:     Patient ID: Daniel Nelson, male   DOB: 1986-02-04, 30 y.o.   MRN: [id number]  HPI  Interview was conducted with an arabic interpretor.   Daniel Nelson is a 30 y.o. Male with a history of H. Pylori infection who presents with intermittent lower and upper extremity weakness and paralysis.  Chief concerns:   Knee/Leg weakness: Endorses weakness in legs bilaterally that radiates from knees down to calves which started a week ago and has not improved. Describes weakness as temporary paralysis and heaviness that appears to last 5-6 minutes per episode. During this time he is unable to put weight on his legs and has difficulty standing or walking. Symptoms start after stomach pain starts and is not associated with other medical issues. Currently asymptomatic. 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 Former tobacco user but no longer smokes. Denies fever, history of trauma, regular exercise, fever, family history of myalgia, alcohol use, or diarrhea.  Elbow weakness: Endorses weakness and temporary paralysis in antecubital fossa bilaterally that is associated with stomach pain. Says each episode lasts 5-6 minutes as well. Is right handed. Currently asymptomatic. Denies trauma to either arm.   Stomach pain: Has been having stomach pain for 10 years which started in [place]. Endorses "ballooning-like feeling" in epigastric area sometimes associated with nausea. Was seen by doctors in [place] prior to coming to the [place] in August of 2016 and was told he had ulcers in the stomach after receiving endoscopic testing. He is currently being treated with antibiotics and another medication for his stomach he cannot recall. He was also told he had low B12. Has history of appendectomy when he was much younger. Denies diarrhea, constipation, vomiting, fever, blood in stool, or supplement use.  Review of Systems See HPI     Objective:   Physical Exam Filed  Vitals:   11/22/15 0850  BP: 116/67  Pulse: 60  Temp: 97.6 F (36.4 C)   Blood pressure 116/67, pulse 60, temperature 97.6 F (36.4 C), temperature source Oral, height 6' 1.25" (1.861 m), weight 163 lb 3.2 oz (74.027 kg).  Gen: Healthy appearing, conversant, oriented to time and place.  Pulm: Clear to auscultation bilaterally, no increase work of breathing.  CV: RRR. No murmurs, rubs, or gallops.  MSK: No gross deficits or atrophy noted. MSK Knee and elbow: No gross deficits, atrophy, tenderness to palpation, fusion observed. Normal range of motion and normal strength 5/5. Elbows and ankles +2 reflexes bilaterally. Knee reflexes +3 bilaterally.      Assessment:     Daniel Nelson is a 30 y.o. Male with a history of H. Pylori infection who presents with intermittent lower and upper extremity weakness and paralysis concerning for myalgia of undetermined cause given absence of fever, normal physical exam findings, no history of trauma, and no notable family medical history. Hypothyroidism and vitamin D deficiency may be likely but require further evaluation with lab workup. Autoimmune etiology unlikely given no notable family history, normal physical exam, and intermittent symptoms that start with stomach pain.   Stomach pain is concerning for ongoing H. Pylori infection that is currently being treated with antibiotics.     Plan:     Elbow and knee weakness:  -Recommend Tylenol as needed when he has symptoms in knees or elbows.  -Will get CRP, ESR, CK, VitD and re-evaluate -Return precautions discussed with patient.  Stomach pain:  -Recommend continuing antibiotics and avoiding treatment with Ibuprofen. Pain symptoms can  be managed with Tylenol as needed.

## 2015-11-22 NOTE — Progress Notes (Signed)
   Subjective:    Daniel Nelson - 30 y.o. male MRN [id number]  Date of birth: 12/26/1985  CC pain and weakness   HPI  [facility] here for pain and weakness. Arabic video translator was used during this interview. Patient is a [place] refugee.   He is complain of transient muscle and joint weakness. This started about a week ago.  He denies any specific trigger.  The weakness and pain occurs in his knees and radiates distally.  He also has this feeling in his elbows.  Denies any prior injury or any recent trauma.  He has not taken any medications for this.  Reports that it is staying the same.  These feelings have never occurred to him before.  Does not have any numbness or tingling in his hands or feet.  Reports that he has these symptoms after he has his abdominal pain.  Reports he has a history of B12 deficiency when he was living in [place] and was on supplements.   SH: prior tobacco use PMH: H. Pylori FH: non contributory   Health Maintenance:  Health Maintenance Due  Topic Date Due  . HIV Screening  07/20/2002  . TETANUS/TDAP  07/20/2006    Review of Systems See HPI     Objective:   Physical Exam BP 116/67 mmHg  Pulse 60  Temp(Src) 97.6 F (36.4 C) (Oral)  Ht 6' 1.25" (1.861 m)  Wt 163 lb 3.2 oz (74.027 kg)  BMI 21.37 kg/m2 Gen: NAD, alert, cooperative with exam, well-appearing MSK: well built, no obvious deformity, normal gait, right handed and right footed  Upper extremities show no sign of atrophy and no pain with palpation  Normal grip strength  No pain with palpation over the bony prominences.  No winging of the scapula  Full range of motion of his shoulders  Normal range of motion of elbows bilaterally. Unable to recreate his feeling of weakness.  Negative Tinel's and Phalen's  Negative Spurling's  No spinal tenderness to palpation Lower extremity:  Normal internal and external rotation of the hip Normal dorsal and plantar  flexion of the ankles.  5/5 strength in lower extremity   Knee: No obvious effusion in the knees  Normal patellar tracking  Normal flexion and extension of knees. No pain with patellar compression  Negative McMurray's   Neuro: +3 DTR's at the patella but +2 in other joints symmetrically      Assessment & Plan:   Myalgia Unclear picture as to why it is associated with his abdominal pain. His exam is normal  Denies any excessive exercise.  No alcohol or illicit drug use Electrolytes have been normal as of last month MCV of 92 with his history of B12 Def.  - check TSH, CK, Sed rate and CRP - discussed that these symptoms may resolve on their own. If worsen then follow up and can consider ANA, RF, uric acid vs consider if this is a somatoform presentation.

## 2015-11-22 NOTE — Patient Instructions (Signed)
Thank you for coming in,   You can try aspercream for the joint pain.   If the pain gets worse then please follow up sooner.   If the pain stays the same then you can follow up in one month.   If the pain goes away then it could just be a muscle pain that comes and goes.   Please bring all of your medications with you to each visit.   Health maintenance items that are due.  Health Maintenance  Topic Date Due  . HIV Screening  07/20/2002  . Tetanus Vaccine  07/20/2006  . Flu Shot  02/18/2016     Sign up for My Chart to have easy access to your labs results, and communication with your Primary care physician   Please feel free to call with any questions or concerns at any time, at [phone]. --Dr. Jordan LikesSchmitz

## 2015-11-22 NOTE — Assessment & Plan Note (Signed)
Unclear picture as to why it is associated with his abdominal pain. His exam is normal  Denies any excessive exercise.  No alcohol or illicit drug use Electrolytes have been normal as of last month MCV of 92 with his history of B12 Def.  - check TSH, CK, Sed rate and CRP - discussed that these symptoms may resolve on their own. If worsen then follow up and can consider ANA, RF, uric acid vs consider if this is a somatoform presentation.

## 2015-11-25 ENCOUNTER — Encounter: Payer: Self-pay | Admitting: Family Medicine

## 2015-11-25 ENCOUNTER — Ambulatory Visit (HOSPITAL_COMMUNITY)
Admission: EM | Admit: 2015-11-25 | Discharge: 2015-11-25 | Disposition: A | Discharge status: Home / Self Care | Payer: Medicaid Other | Attending: Emergency Medicine | Admitting: Emergency Medicine

## 2015-11-25 ENCOUNTER — Ambulatory Visit (INDEPENDENT_AMBULATORY_CARE_PROVIDER_SITE_OTHER): Payer: Medicaid Other

## 2015-11-25 ENCOUNTER — Encounter (HOSPITAL_COMMUNITY): Payer: Self-pay | Admitting: Nurse Practitioner

## 2015-11-25 DIAGNOSIS — R1013 Epigastric pain: Secondary | ICD-10-CM

## 2015-11-25 DIAGNOSIS — R0602 Shortness of breath: Secondary | ICD-10-CM | POA: Diagnosis not present

## 2015-11-25 DIAGNOSIS — K5900 Constipation, unspecified: Secondary | ICD-10-CM | POA: Diagnosis not present

## 2015-11-25 MED ORDER — PEG 3350-KCL-NABCB-NACL-NASULF 236 G PO SOLR: ORAL | Status: DC

## 2015-11-25 MED ORDER — SUCRALFATE 1 GM/10ML PO SUSP: 1.0000 g | Freq: Three times a day (TID) | ORAL | Status: DC

## 2015-11-25 NOTE — Discharge Instructions (Signed)
Your pain is likely coming from stomach ulcers as well as some constipation. Take GoLYTELY as prescribed. This will cause diarrhea. Take the Carafate 4 times a day to coat your stomach. This will help with the pain when you eat. Follow-up with your primary care doctor as scheduled next week. He may be able to get your specialist appointment moved sooner.

## 2015-11-25 NOTE — ED Provider Notes (Signed)
CSN: [id number]     Arrival date & time 11/25/15  1436 History   First MD Initiated Contact with Patient 11/25/15 1651     Chief Complaint  Patient presents with  . Abdominal Pain   (Consider location/radiation/quality/duration/timing/severity/associated sxs/prior Treatment) HPI  He is a 30 year old man here for evaluation of abdominal pain. He has been seen for this both by his PCP and in the ED without improvement. He does have a history of stomach ulcers when he was in [place]. He states since coming to the [place] his pain has been worse, particularly in the last month. The pain is in the epigastric area as well as the left abdomen. He states it will radiate to his left shoulder and into his chest. It does wax and wane. When it becomes intense he will have shortness of breath and dizziness. It will also make him shake. Pain is worse with eating or drinking anything. Nothing makes it better, including the treatment for H. pylori he just received. No fevers.  No vomiting. No diarrhea. He does report a history of irregular bowel movements. His last bowel movement was this morning. He denies any blood in the stool. He does have an appointment with a specialist, but states it is not for 6 weeks.  History reviewed. No pertinent past medical history. Past Surgical History  Procedure Laterality Date  . Appendectomy     History reviewed. No pertinent family history. Social History  Substance Use Topics  . Smoking status: Former Games developermoker  . Smokeless tobacco: None  . Alcohol Use: No    Review of Systems As in history of present illness Allergies  Dicyclomine  Home Medications   Prior to Admission medications   Medication Sig Start Date End Date Taking? Authorizing Provider  amoxicillin (AMOXIL) 500 MG capsule Take 2 capsules (1,000 mg total) by mouth 2 (two) times daily. 11/11/15   Hillary Percell BostonMoen Fitzgerald, MD  clarithromycin (BIAXIN) 500 MG tablet Take 1 tablet (500 mg total) by mouth 2 (two) times  daily. 11/11/15   Hillary Percell BostonMoen Fitzgerald, MD  dicyclomine (BENTYL) 20 MG tablet Take 1 tablet (20 mg total) by mouth 2 (two) times daily as needed for spasms. 11/06/15   Benjiman CoreNathan Pickering, MD  fluticasone (FLONASE) 50 MCG/ACT nasal spray Place 2 sprays into both nostrils daily. 11/11/15   Hillary Percell BostonMoen Fitzgerald, MD  loratadine (CLARITIN) 10 MG tablet Take 1 tablet (10 mg total) by mouth daily as needed for allergies. 11/11/15   Hillary Percell BostonMoen Fitzgerald, MD  omeprazole (PRILOSEC) 20 MG capsule TAKE 1 CAPSULE BY MOUTH TWICE DAILY 11/12/15   Tobey GrimJeffrey H Walden, MD  polyethylene glycol (GOLYTELY) 236 g solution Drink a glass every 10 minutes until gone. 11/25/15   Charm RingsErin J Shreeya Recendiz, MD  sucralfate (CARAFATE) 1 GM/10ML suspension Take 10 mLs (1 g total) by mouth 4 (four) times daily -  with meals and at bedtime. 11/25/15   Charm RingsErin J Paris Hohn, MD   Meds Ordered and Administered this Visit  Medications - No data to display  BP 123/76 mmHg  Pulse 71  Temp(Src) 98 F (36.7 C) (Oral)  Resp 16  SpO2 100% No data found.   Physical Exam  Constitutional: He is oriented to person, place, and time. He appears well-developed and well-nourished. No distress.  Neck: Neck supple.  Cardiovascular: Normal rate, regular rhythm and normal heart sounds.   No murmur heard. Pulmonary/Chest: Effort normal and breath sounds normal. No respiratory distress. He has no wheezes. He has no rales.  Abdominal: Bowel sounds are normal. He exhibits no distension and no mass. There is tenderness (in epigastric and along left side). There is no rebound and no guarding.  Neurological: He is alert and oriented to person, place, and time.    ED Course  Procedures (including critical care time) ED ECG REPORT   Date: 11/25/2015  Rate: 76  Rhythm: normal sinus rhythm  QRS Axis: normal  Intervals: normal  ST/T Wave abnormalities: normal  Conduction Disutrbances:none  Narrative Interpretation: NSR, normal EKG  Old EKG Reviewed: none  available  I have personally reviewed the EKG tracing and agree with the computerized printout as noted.  Labs Review Labs Reviewed - No data to display  Imaging Review Dg Chest 2 View  11/25/2015  CLINICAL DATA:  Chest and abdominal pain for 2 months EXAM: CHEST  2 VIEW COMPARISON:  05/21/2015 FINDINGS: Normal heart size. Lungs clear. No pneumothorax. No pleural effusion. Hyperaeration. Bronchitic changes. IMPRESSION: Stable hyperaeration with bronchitic changes. Electronically Signed   By: Jolaine Click M.D.   On: 11/25/2015 17:18   Dg Abd 2 Views  11/25/2015  CLINICAL DATA:  30 year old with left-sided chest and abdominal pain for a couple months. Initial encounter. EXAM: ABDOMEN - 2 VIEW COMPARISON:  None. FINDINGS: The bowel gas pattern is normal. There is no free intraperitoneal air or suspicious abdominal calcification. The bones appear unremarkable. IMPRESSION: No active abdominal findings. Electronically Signed   By: Carey Bullocks M.D.   On: 11/25/2015 17:19      MDM   1. Epigastric pain   2. Constipation, unspecified constipation type   3. Shortness of breath    X-rays are unremarkable. On independent review of the abdominal film he does have a moderate to large stool burden. We'll treat for constipation with GoLYTELY. Discussed that this will give him diarrhea. We'll also treat with Carafate to help with likely gastritis versus stomach ulcers. He has a follow-up with his PCP next week. I've encouraged him to discuss his referral at this appointment as he would like to see the specialist sooner.    Charm Rings, MD 11/25/15 1758

## 2015-11-25 NOTE — ED Notes (Signed)
He c/o 2 month history of abd cramping at night. Describes as "burning." Denies n/v, bowel/bladder changes, fevers. He reports several visits to his PCP and ED for this with no diagnosis. States he is taking :"some  Kind of medicine for the pain " which does not help. He is alert and breathing easily

## 2015-12-02 ENCOUNTER — Encounter (HOSPITAL_COMMUNITY): Payer: Self-pay | Admitting: *Deleted

## 2015-12-02 ENCOUNTER — Emergency Department (HOSPITAL_COMMUNITY)
Admission: EM | Admit: 2015-12-02 | Discharge: 2015-12-02 | Disposition: A | Discharge status: Home / Self Care | Payer: Medicaid Other | Attending: Emergency Medicine | Admitting: Emergency Medicine

## 2015-12-02 DIAGNOSIS — Z7952 Long term (current) use of systemic steroids: Secondary | ICD-10-CM | POA: Insufficient documentation

## 2015-12-02 DIAGNOSIS — R109 Unspecified abdominal pain: Secondary | ICD-10-CM | POA: Insufficient documentation

## 2015-12-02 DIAGNOSIS — Z79899 Other long term (current) drug therapy: Secondary | ICD-10-CM | POA: Insufficient documentation

## 2015-12-02 DIAGNOSIS — R140 Abdominal distension (gaseous): Secondary | ICD-10-CM | POA: Diagnosis not present

## 2015-12-02 DIAGNOSIS — Z792 Long term (current) use of antibiotics: Secondary | ICD-10-CM | POA: Diagnosis not present

## 2015-12-02 DIAGNOSIS — Z87891 Personal history of nicotine dependence: Secondary | ICD-10-CM | POA: Diagnosis not present

## 2015-12-02 DIAGNOSIS — Z9049 Acquired absence of other specified parts of digestive tract: Secondary | ICD-10-CM | POA: Diagnosis not present

## 2015-12-02 LAB — COMPREHENSIVE METABOLIC PANEL
ALBUMIN: 4.5 g/dL (ref 3.5–5.0)
ALT: 13 U/L — ABNORMAL LOW (ref 17–63)
ANION GAP: 11 (ref 5–15)
AST: 16 U/L (ref 15–41)
Alkaline Phosphatase: 43 U/L (ref 38–126)
BILIRUBIN TOTAL: 0.7 mg/dL (ref 0.3–1.2)
BUN: 8 mg/dL (ref 6–20)
CHLORIDE: 105 mmol/L (ref 101–111)
CO2: 22 mmol/L (ref 22–32)
Calcium: 10 mg/dL (ref 8.9–10.3)
Creatinine, Ser: 0.94 mg/dL (ref 0.61–1.24)
GFR calc Af Amer: >60 mL/min (ref >60)
GFR calc non Af Amer: >60 mL/min (ref >60)
GLUCOSE: 108 mg/dL — AB (ref 65–99)
POTASSIUM: 3.4 mmol/L — AB (ref 3.5–5.1)
SODIUM: 138 mmol/L (ref 135–145)
TOTAL PROTEIN: 7.2 g/dL (ref 6.5–8.1)

## 2015-12-02 LAB — CBC
HEMATOCRIT: 42.9 % (ref 39.0–52.0)
HEMOGLOBIN: 14.8 g/dL (ref 13.0–17.0)
MCH: 31 pg (ref 26.0–34.0)
MCHC: 34.5 g/dL (ref 30.0–36.0)
MCV: 89.7 fL (ref 78.0–100.0)
Platelets: 149 10*3/uL — ABNORMAL LOW (ref 150–400)
RBC: 4.78 MIL/uL (ref 4.22–5.81)
RDW: 12.4 % (ref 11.5–15.5)
WBC: 6.6 10*3/uL (ref 4.0–10.5)

## 2015-12-02 LAB — LIPASE, BLOOD: LIPASE: 22 U/L (ref 11–51)

## 2015-12-02 MED ORDER — SIMETHICONE 80 MG PO CHEW: 80.0000 mg | CHEWABLE_TABLET | Freq: Four times a day (QID) | ORAL | Status: DC | PRN

## 2015-12-02 MED ORDER — DICYCLOMINE HCL 10 MG PO CAPS
10.0000 mg | ORAL_CAPSULE | Freq: Once | ORAL | Status: AC
  Administered 2015-12-02: 10 mg via ORAL
  Filled 2015-12-02: qty 1

## 2015-12-02 MED ORDER — SIMETHICONE 80 MG PO CHEW
80.0000 mg | CHEWABLE_TABLET | Freq: Once | ORAL | Status: AC
  Administered 2015-12-02: 80 mg via ORAL
  Filled 2015-12-02: qty 1

## 2015-12-02 MED ORDER — DICYCLOMINE HCL 20 MG PO TABS: 20.0000 mg | ORAL_TABLET | Freq: Two times a day (BID) | ORAL | Status: DC

## 2015-12-02 NOTE — ED Notes (Signed)
Used Nurse, learning disabilityTranslator for discharge

## 2015-12-02 NOTE — ED Provider Notes (Signed)
CSN: [id number]     Arrival date & time 12/02/15  1339 History   First MD Initiated Contact with Patient 12/02/15 1519     Chief Complaint  Patient presents with  . Abdominal Pain     (Consider location/radiation/quality/duration/timing/severity/associated sxs/prior Treatment) Patient is a 30 y.o. male presenting with abdominal pain. The history is provided by the patient and medical records.  Abdominal Pain   30 year old male with no significant past medical history presenting to the ED for abdominal pain. Patient speaks Arabic, lung which interpreter was used. Patient reports he drank milk this morning and shortly after began having cramping abdominal pain and bloating. He states this does happen from time to time when he has dairy. He states the milk did not taste funny, he did not check the expiration date.  He denies any nausea, vomiting, or diarrhea. He did have a bowel movement this morning that was somewhat firm.  He states he tried some over-the-counter medication, does not remember what it was. He states he continues to have some cramping pain and bloating. He does not have any known history of lactose intolerance or other GI issues. Prior abdominal surgeries include appendectomy. His vital signs are stable on arrival.  Patient is requesting medications to help with his symptoms.  History reviewed. No pertinent past medical history. Past Surgical History  Procedure Laterality Date  . Appendectomy     History reviewed. No pertinent family history. Social History  Substance Use Topics  . Smoking status: Former Games developermoker  . Smokeless tobacco: None  . Alcohol Use: No    Review of Systems  Gastrointestinal: Positive for abdominal pain.  All other systems reviewed and are negative.     Allergies  Dicyclomine  Home Medications   Prior to Admission medications   Medication Sig Start Date End Date Taking? Authorizing Provider  amoxicillin (AMOXIL) 500 MG capsule Take 2  capsules (1,000 mg total) by mouth 2 (two) times daily. 11/11/15   Hillary Percell BostonMoen Fitzgerald, MD  clarithromycin (BIAXIN) 500 MG tablet Take 1 tablet (500 mg total) by mouth 2 (two) times daily. 11/11/15   Hillary Percell BostonMoen Fitzgerald, MD  dicyclomine (BENTYL) 20 MG tablet Take 1 tablet (20 mg total) by mouth 2 (two) times daily as needed for spasms. 11/06/15   Benjiman CoreNathan Pickering, MD  fluticasone (FLONASE) 50 MCG/ACT nasal spray Place 2 sprays into both nostrils daily. 11/11/15   Hillary Percell BostonMoen Fitzgerald, MD  loratadine (CLARITIN) 10 MG tablet Take 1 tablet (10 mg total) by mouth daily as needed for allergies. 11/11/15   Hillary Percell BostonMoen Fitzgerald, MD  omeprazole (PRILOSEC) 20 MG capsule TAKE 1 CAPSULE BY MOUTH TWICE DAILY 11/12/15   Tobey GrimJeffrey H Walden, MD  polyethylene glycol (GOLYTELY) 236 g solution Drink a glass every 10 minutes until gone. 11/25/15   Charm RingsErin J Honig, MD  sucralfate (CARAFATE) 1 GM/10ML suspension Take 10 mLs (1 g total) by mouth 4 (four) times daily -  with meals and at bedtime. 11/25/15   Charm RingsErin J Honig, MD   BP 116/80 mmHg  Pulse 59  Temp(Src) 98.6 F (37 C) (Oral)  Resp 20  SpO2 100%   Physical Exam  Constitutional: He is oriented to person, place, and time. He appears well-developed and well-nourished. No distress.  HENT:  Head: Normocephalic and atraumatic.  Mouth/Throat: Oropharynx is clear and moist.  Eyes: Conjunctivae and EOM are normal. Pupils are equal, round, and reactive to light.  Neck: Normal range of motion. Neck supple.  Cardiovascular: Normal rate,  regular rhythm and normal heart sounds.   Pulmonary/Chest: Effort normal and breath sounds normal. No respiratory distress. He has no wheezes.  Abdominal: Soft. Normal appearance and bowel sounds are normal. There is no tenderness. There is no guarding and no CVA tenderness.  Abdomen soft, nondistended, no focal tenderness or peritoneal signs  Musculoskeletal: Normal range of motion. He exhibits no edema.  Neurological: He is alert  and oriented to person, place, and time.  Skin: Skin is warm and dry. He is not diaphoretic.  Psychiatric: He has a normal mood and affect.  Nursing note and vitals reviewed.   ED Course  Procedures (including critical care time) Labs Review Labs Reviewed  COMPREHENSIVE METABOLIC PANEL - Abnormal; Notable for the following:    Potassium 3.4 (*)    Glucose, Bld 108 (*)    ALT 13 (*)    All other components within normal limits  CBC - Abnormal; Notable for the following:    Platelets 149 (*)    All other components within normal limits  LIPASE, BLOOD  URINALYSIS, ROUTINE [facility]OSCOPIC [facility])    Imaging Review No results found. I have personally reviewed and evaluated these images and lab results as part of my medical decision-making.   EKG Interpretation None      MDM   Final diagnoses:  Abdominal cramping  Abdominal bloating   30 year old male here with abdominal cramping and bloating after drinking milk this morning. She reports similar symptoms in the past when consuming dairy. No known history of lactose intolerance or other GI issues. Patient is afebrile, nontoxic. His abdominal exam is benign. No noted distention. He has had a bowel movement today, has been passing gas without difficulty.  His lab work is reassuring.  I suspect there may be a component of lactose intolerance. This time given reassuring labs and benign exam, do not feel he needs emergent imaging.  Patient given bentyl and gas-x here in ED, discharge home with same.  Has previously scheduled follow-up with PCP tomorrow.  Given referral for GI as well if continue having issues.  Discussed plan with patient, he/she acknowledged understanding and agreed with plan of care.  Return precautions given for new or worsening symptoms.    Garlon Hatchet, PA-C 12/02/15 1741  Benjiman Core, MD 12/03/15 2350

## 2015-12-02 NOTE — ED Notes (Signed)
Pt speaks arabic, used interpretor phone. Pt having abd pain and cramping since drinking milk yesterday. Last bm was this morning but reports having difficulty going. Denies vomiting or diarrhea.

## 2015-12-02 NOTE — ED Notes (Signed)
PA at the bedside using translator phone for patient

## 2015-12-02 NOTE — Discharge Instructions (Signed)
Take the prescribed medication as directed.  Avoid dairy for the next day or so until symptoms improve. Follow-up with your primary care doctor tomorrow as scheduled. If symptoms persist, may wish to follow-up with GI physician as well-- you may call to scheduled appt or have your primary care doctor refer you. Return to the ED for new or worsening symptoms.

## 2015-12-03 ENCOUNTER — Ambulatory Visit: Payer: Medicaid Other | Admitting: Family Medicine

## 2015-12-09 ENCOUNTER — Telehealth: Payer: Self-pay | Admitting: Family Medicine

## 2015-12-09 ENCOUNTER — Encounter (HOSPITAL_COMMUNITY): Payer: Self-pay | Admitting: Nurse Practitioner

## 2015-12-09 ENCOUNTER — Ambulatory Visit (HOSPITAL_COMMUNITY)
Admission: EM | Admit: 2015-12-09 | Discharge: 2015-12-09 | Disposition: A | Discharge status: Home / Self Care | Payer: Medicaid Other | Attending: Family Medicine | Admitting: Family Medicine

## 2015-12-09 ENCOUNTER — Encounter: Payer: Self-pay | Admitting: Gastroenterology

## 2015-12-09 DIAGNOSIS — K589 Irritable bowel syndrome without diarrhea: Secondary | ICD-10-CM | POA: Diagnosis not present

## 2015-12-09 MED ORDER — GI COCKTAIL ~~LOC~~
30.0000 mL | Freq: Once | ORAL | Status: AC
  Administered 2015-12-09: 30 mL via ORAL

## 2015-12-09 MED ORDER — GI COCKTAIL ~~LOC~~
ORAL | Status: AC
  Filled 2015-12-09: qty 30

## 2015-12-09 MED ORDER — HYOSCYAMINE SULFATE ER 0.375 MG PO TB12: 0.3750 mg | ORAL_TABLET | Freq: Two times a day (BID) | ORAL | Status: DC

## 2015-12-09 NOTE — Telephone Encounter (Signed)
EM contact person calling regarding a referral to a GI specialist.  Contacted [facility] and the earliest they can get him in is 6/20.  Would like to see if any other practice can see him sooner.  Have been having syncopal episodes.  Concerned he may have a growth in stomach area.

## 2015-12-09 NOTE — ED Notes (Signed)
Pt c/o continued abd pain for past several months. He reports several visits to the ER and urgent care and several treatments for this complaint but pain persists in L upper abd. He has scheduled a gastroenterology appointment next week.

## 2015-12-09 NOTE — Telephone Encounter (Signed)
First available at [facility] is also mid to late June (made an appt for another patient earlier today). There will not be much different at any other GI office. Patient should keep his appt.

## 2015-12-09 NOTE — Telephone Encounter (Signed)
I will forward this to our referral specialist Tia [facility].  I don't know if any other practice would be able to see him sooner.  If this becomes worse, he should be seen at the emergency department.

## 2015-12-09 NOTE — ED Provider Notes (Addendum)
CSN: [id number]     Arrival date & time 12/09/15  1351 History   First MD Initiated Contact with Patient 12/09/15 1522     Chief Complaint  Patient presents with  . Abdominal Pain   (Consider location/radiation/quality/duration/timing/severity/associated sxs/prior Treatment) Patient is a 30 y.o. male presenting with abdominal pain. The history is provided by the patient and a friend. The history is limited by a language barrier. A language interpreter was used (friend trans without diff.).  Abdominal Pain Pain location:  LUQ Pain quality: burning and cramping   Pain radiates to:  Does not radiate Onset quality:  Gradual Timing:  Intermittent Chronicity:  Chronic Context: not alcohol use, not diet changes and not trauma   Context comment:  Sev ucc and er visits and eval and treatments, no relief of sx. Relieved by:  Nothing Associated symptoms: no anorexia, no chest pain, no chills, no diarrhea, no fever, no hematochezia, no melena, no nausea and no vomiting   Risk factors: not obese     History reviewed. No pertinent past medical history. Past Surgical History  Procedure Laterality Date  . Appendectomy     History reviewed. No pertinent family history. Social History  Substance Use Topics  . Smoking status: Former Games developer  . Smokeless tobacco: None  . Alcohol Use: No    Review of Systems  Constitutional: Negative.  Negative for fever and chills.  HENT: Negative.   Cardiovascular: Negative for chest pain.  Gastrointestinal: Positive for abdominal pain. Negative for nausea, vomiting, diarrhea, blood in stool, melena, hematochezia and anorexia.  Genitourinary: Negative.   All other systems reviewed and are negative.   Allergies  Dicyclomine  Home Medications   Prior to Admission medications   Medication Sig Start Date End Date Taking? Authorizing Provider  amoxicillin (AMOXIL) 500 MG capsule Take 2 capsules (1,000 mg total) by mouth 2 (two) times daily. 11/11/15    Hillary Percell Boston, MD  clarithromycin (BIAXIN) 500 MG tablet Take 1 tablet (500 mg total) by mouth 2 (two) times daily. 11/11/15   Hillary Percell Boston, MD  dicyclomine (BENTYL) 20 MG tablet Take 1 tablet (20 mg total) by mouth 2 (two) times daily. 12/02/15   Garlon Hatchet, PA-C  fluticasone (FLONASE) 50 MCG/ACT nasal spray Place 2 sprays into both nostrils daily. 11/11/15   Hillary Percell Boston, MD  hyoscyamine (LEVBID) 0.375 MG 12 hr tablet Take 1 tablet (0.375 mg total) by mouth 2 (two) times daily. 12/09/15   Linna Hoff, MD  loratadine (CLARITIN) 10 MG tablet Take 1 tablet (10 mg total) by mouth daily as needed for allergies. 11/11/15   Hillary Percell Boston, MD  omeprazole (PRILOSEC) 20 MG capsule TAKE 1 CAPSULE BY MOUTH TWICE DAILY 11/12/15   Tobey Grim, MD  polyethylene glycol (GOLYTELY) 236 g solution Drink a glass every 10 minutes until gone. 11/25/15   Charm Rings, MD  simethicone (GAS-X) 80 MG chewable tablet Chew 1 tablet (80 mg total) by mouth every 6 (six) hours as needed for flatulence. 12/02/15   Garlon Hatchet, PA-C  sucralfate (CARAFATE) 1 GM/10ML suspension Take 10 mLs (1 g total) by mouth 4 (four) times daily -  with meals and at bedtime. 11/25/15   Charm Rings, MD   Meds Ordered and Administered this Visit   Medications  gi cocktail (Maalox,Lidocaine,Donnatal) (30 mLs Oral Given 12/09/15 1553)    BP 116/76 mmHg  Pulse 75  Temp(Src) 99.3 F (37.4 C) (Oral)  Resp 16  SpO2 100% No data found.   Physical Exam  ED Course  Procedures (including critical care time)  Labs Review Labs Reviewed - No data to display  Imaging Review No results found.   Visual Acuity Review  Right Eye Distance:   Left Eye Distance:   Bilateral Distance:    Right Eye Near:   Left Eye Near:    Bilateral Near:         MDM   1. IBS (irritable bowel syndrome)        Linna HoffJames D Pierre Dellarocco, MD 12/09/15 1752  Linna HoffJames D Zetha Kuhar, MD 12/09/15 [phone]

## 2015-12-20 ENCOUNTER — Ambulatory Visit (HOSPITAL_COMMUNITY)
Admission: EM | Admit: 2015-12-20 | Discharge: 2015-12-20 | Disposition: A | Discharge status: Home / Self Care | Payer: Medicaid Other | Attending: Emergency Medicine | Admitting: Emergency Medicine

## 2015-12-20 ENCOUNTER — Encounter (HOSPITAL_COMMUNITY): Payer: Self-pay | Admitting: *Deleted

## 2015-12-20 DIAGNOSIS — H6692 Otitis media, unspecified, left ear: Secondary | ICD-10-CM | POA: Diagnosis not present

## 2015-12-20 MED ORDER — HYDROCODONE-ACETAMINOPHEN 5-325 MG PO TABS: 1.0000 | ORAL_TABLET | ORAL | Status: DC | PRN

## 2015-12-20 MED ORDER — AMOXICILLIN 500 MG PO CAPS: 500.0000 mg | ORAL_CAPSULE | Freq: Three times a day (TID) | ORAL | Status: DC

## 2015-12-20 NOTE — ED Provider Notes (Signed)
CSN: [id number]     Arrival date & time 12/20/15  1926 History   First MD Initiated Contact with Patient 12/20/15 1948     Chief Complaint  Patient presents with  . Nasal Congestion   (Consider location/radiation/quality/duration/timing/severity/associated sxs/prior Treatment) HPI History obtained from patient:  Pt presents with the cc of:  Left ear pain Duration of symptoms: 2 days Treatment prior to arrival: Over-the-counter medications without relief of symptoms Context: Sudden onset after swimming a week ago felt congested and now ear aches Other symptoms include: Nasal congestion Pain score: 4 FAMILY HISTORY: None known to patient    History reviewed. No pertinent past medical history. Past Surgical History  Procedure Laterality Date  . Appendectomy     History reviewed. No pertinent family history. Social History  Substance Use Topics  . Smoking status: Former Games developermoker  . Smokeless tobacco: None  . Alcohol Use: No    Review of Systems  Denies: HEADACHE, NAUSEA, ABDOMINAL PAIN, CHEST PAIN, CONGESTION, DYSURIA, SHORTNESS OF BREATH  Allergies  Dicyclomine  Home Medications   Prior to Admission medications   Medication Sig Start Date End Date Taking? Authorizing Provider  amoxicillin (AMOXIL) 500 MG capsule Take 2 capsules (1,000 mg total) by mouth 2 (two) times daily. 11/11/15   Hillary Percell BostonMoen Fitzgerald, MD  amoxicillin (AMOXIL) 500 MG capsule Take 1 capsule (500 mg total) by mouth 3 (three) times daily. 12/20/15   Tharon AquasFrank C Knolan Simien, PA  clarithromycin (BIAXIN) 500 MG tablet Take 1 tablet (500 mg total) by mouth 2 (two) times daily. 11/11/15   Hillary Percell BostonMoen Fitzgerald, MD  dicyclomine (BENTYL) 20 MG tablet Take 1 tablet (20 mg total) by mouth 2 (two) times daily. 12/02/15   Garlon HatchetLisa M Sanders, PA-C  fluticasone (FLONASE) 50 MCG/ACT nasal spray Place 2 sprays into both nostrils daily. 11/11/15   Hillary Percell BostonMoen Fitzgerald, MD  HYDROcodone-acetaminophen (NORCO/VICODIN) 5-325 MG tablet  Take 1 tablet by mouth every 4 (four) hours as needed. 12/20/15   Tharon AquasFrank C Breven Guidroz, PA  hyoscyamine (LEVBID) 0.375 MG 12 hr tablet Take 1 tablet (0.375 mg total) by mouth 2 (two) times daily. 12/09/15   Linna HoffJames D Kindl, MD  loratadine (CLARITIN) 10 MG tablet Take 1 tablet (10 mg total) by mouth daily as needed for allergies. 11/11/15   Hillary Percell BostonMoen Fitzgerald, MD  omeprazole (PRILOSEC) 20 MG capsule TAKE 1 CAPSULE BY MOUTH TWICE DAILY 11/12/15   Tobey GrimJeffrey H Walden, MD  polyethylene glycol (GOLYTELY) 236 g solution Drink a glass every 10 minutes until gone. 11/25/15   Charm RingsErin J Honig, MD  simethicone (GAS-X) 80 MG chewable tablet Chew 1 tablet (80 mg total) by mouth every 6 (six) hours as needed for flatulence. 12/02/15   Garlon HatchetLisa M Sanders, PA-C  sucralfate (CARAFATE) 1 GM/10ML suspension Take 10 mLs (1 g total) by mouth 4 (four) times daily -  with meals and at bedtime. 11/25/15   Charm RingsErin J Honig, MD   Meds Ordered and Administered this Visit  Medications - No data to display  BP 129/89 mmHg  Pulse 96  Temp(Src) 98.6 F (37 C) (Oral)  Resp 18  SpO2 100% No data found.   Physical Exam NURSES NOTES AND VITAL SIGNS REVIEWED. CONSTITUTIONAL: Well developed, well nourished, no acute distress HEENT: normocephalic, atraumaticLeft TM is red bulging with poor light reflex and no motion,  right TM is normal., Throat is clear EYES: Conjunctiva normal NECK:normal ROM, supple, no adenopathy PULMONARY:No respiratory distress, normal effort ABDOMINAL: Soft, ND, NT BS+, No CVAT MUSCULOSKELETAL:  Normal ROM of all extremities,  SKIN: warm and dry without rash PSYCHIATRIC: Mood and affect, behavior are normal  ED Course  Procedures (including critical care time)  Labs Review Labs Reviewed - No data to display  Imaging Review No results found.   Visual Acuity Review  Right Eye Distance:   Left Eye Distance:   Bilateral Distance:    Right Eye Near:   Left Eye Near:    Bilateral Near:      RX amoxil,  norco   MDM   1. Acute left otitis media, recurrence not specified, unspecified otitis media type     Patient is reassured that there are no issues that require transfer to higher level of care at this time or additional tests. Patient is advised to continue home symptomatic treatment. Patient is advised that if there are new or worsening symptoms to attend the emergency department, contact primary care provider, or return to UC. Instructions of care provided discharged home in stable condition.    THIS NOTE WAS GENERATED USING A VOICE RECOGNITION SOFTWARE PROGRAM. ALL REASONABLE EFFORTS  WERE MADE TO PROOFREAD THIS DOCUMENT FOR ACCURACY.  I have verbally reviewed the discharge instructions with the patient. A printed AVS was given to the patient.  All questions were answered prior to discharge.      Tharon Aquas, PA 12/20/15 2035

## 2015-12-20 NOTE — ED Notes (Signed)
Earache       Dizzy   And     Pain  l  shide  Of  Face down   l   Shoulder   And  l  Arm        Symptoms  Since  Yesterday             Pt  Reports  A  History  Of  Stomach  Problems  Takes med for his  Stomach

## 2015-12-20 NOTE — Discharge Instructions (Signed)
Otitis Media, Adult °Otitis media is redness, soreness, and puffiness (swelling) in the space just behind your eardrum (middle ear). It may be caused by allergies or infection. It often happens along with a cold. °HOME CARE °· Take your medicine as told. Finish it even if you start to feel better. °· Only take over-the-counter or prescription medicines for pain, discomfort, or fever as told by your doctor. °· Follow up with your doctor as told. °GET HELP IF: °· You have otitis media only in one ear, or bleeding from your nose, or both. °· You notice a lump on your neck. °· You are not getting better in 3-5 days. °· You feel worse instead of better. °GET HELP RIGHT AWAY IF:  °· You have pain that is not helped with medicine. °· You have puffiness, redness, or pain around your ear. °· You get a stiff neck. °· You cannot move part of your face (paralysis). °· You notice that the bone behind your ear hurts when you touch it. °MAKE SURE YOU:  °· Understand these instructions. °· Will watch your condition. °· Will get help right away if you are not doing well or get worse. °  °This information is not intended to replace advice given to you by your health care provider. Make sure you discuss any questions you have with your health care provider. °  °Document Released: 12/23/2007 Document Revised: 07/27/2014 Document Reviewed: 01/31/2013 °Elsevier Interactive Patient Education ©2016 [employer] ° ° °

## 2016-01-01 ENCOUNTER — Ambulatory Visit (HOSPITAL_COMMUNITY)
Admission: EM | Admit: 2016-01-01 | Discharge: 2016-01-01 | Disposition: A | Discharge status: Home / Self Care | Payer: Medicaid Other | Attending: Emergency Medicine | Admitting: Emergency Medicine

## 2016-01-01 ENCOUNTER — Encounter (HOSPITAL_COMMUNITY): Payer: Self-pay | Admitting: *Deleted

## 2016-01-01 DIAGNOSIS — R42 Dizziness and giddiness: Secondary | ICD-10-CM | POA: Diagnosis not present

## 2016-01-01 MED ORDER — MECLIZINE HCL 25 MG PO TABS: ORAL_TABLET | ORAL | Status: DC

## 2016-01-01 NOTE — ED Notes (Addendum)
Pt  Reports  Symptoms  Of  dizzyness   As   Well  As   Some  Neck  Pain   With   dizzyness   X 2   Days    pt  Reports   Some  Neck pain  As  Well       Pt  Sitting  Upright on the  Exam table    Speaking in  Complete  sentances    In no  Severe  /  Acute  Distress   Brother in to  Interpret
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
## 2016-01-01 NOTE — ED Notes (Signed)
Brother in to  Interpret

## 2016-01-01 NOTE — Discharge Instructions (Signed)

## 2016-01-07 ENCOUNTER — Ambulatory Visit (INDEPENDENT_AMBULATORY_CARE_PROVIDER_SITE_OTHER): Payer: Medicaid Other | Admitting: Gastroenterology

## 2016-01-07 ENCOUNTER — Encounter: Payer: Self-pay | Admitting: Gastroenterology

## 2016-01-07 VITALS — BP 118/70 | HR 78 | Ht 73.0 in | Wt 154.0 lb

## 2016-01-07 DIAGNOSIS — R109 Unspecified abdominal pain: Secondary | ICD-10-CM

## 2016-01-07 NOTE — Progress Notes (Signed)
HPI: This is a  very pleasant 30 year old man      who was referred to me by Tobey GrimWalden, Jeffrey H, MD  to evaluate  abdominal pain .    Chief complaint is abdominal pain  He is a [place] refugee, he has been here for about 11 months when he immigrated with several family members.  Pains left sided abdomen, also swelling, dry lips.  Fatigue.  Numbness in legs and sometimes fever.  Pains in abomen for 3 months.  These intermittent.  Pain will last -2 hours.  No associated nausea or vomiting.  Eating makes it worse.  No otc pains meds.  Peeing a lot.    Not eating a lot.  Thin, not necessarily losing weight.  No bleeding in stool.    Urine dark yellow or even brown.  Sometimes constipated.     Labs 11/2015 cbc normal except plts around 140, cmet normal except K slightly now.  Lipase normal.  KUB 11/2015 normal.  Review of systems: Pertinent positive and negative review of systems were noted in the above HPI section. Complete review of systems was performed and was otherwise normal.   History reviewed. No pertinent past medical history.  Past Surgical History  Procedure Laterality Date  . Appendectomy      Current Outpatient Prescriptions  Medication Sig Dispense Refill  . hyoscyamine (LEVBID) 0.375 MG 12 hr tablet Take 1 tablet (0.375 mg total) by mouth 2 (two) times daily. 60 tablet 0   No current facility-administered medications for this visit.    Allergies as of 01/07/2016 - Review Complete 01/07/2016  Allergen Reaction Noted  . Dicyclomine Anxiety 11/16/2015    Family History  Problem Relation Age of Onset  . Colon cancer Neg Hx     Social History   Social History  . Marital Status: Married    Spouse Name: N/A  . Number of Children: 2  . Years of Education: N/A   Occupational History  . lawn care at [employer]    Social History Main Topics  . Smoking status: Current Some Day Smoker    Types: Cigarettes  . Smokeless tobacco: Never Used      Comment: tobacco info given 01/07/16  . Alcohol Use: No  . Drug Use: No  . Sexual Activity: Not on file   Other Topics Concern  . Not on file   Social History Narrative     Physical Exam: BP 118/70 mmHg  Pulse 78  Ht 6\' 1"  (1.854 m)  Wt 154 lb (69.854 kg)  BMI 20.32 kg/m2 Constitutional: generally well-appearing Psychiatric: alert and oriented x3 Eyes: extraocular movements intact Mouth: oral pharynx moist, no lesions Neck: supple no lymphadenopathy Cardiovascular: heart regular rate and rhythm Lungs: clear to auscultation bilaterally Abdomen: soft, nontender, nondistended, no obvious ascites, no peritoneal signs, normal bowel sounds Extremities: no lower extremity edema bilaterally Skin: no lesions on visible extremities   Assessment and plan: 30 y.o. male with  left-sided abdominal pain, intermittent  I noted on his labs that he had H. pylori testing by serology several months ago and it was positive. He brought with him several pill bottles today including full pill bottles for antibiotics, proton pump inhibitors. I'm not sure why he never took these but he seems a very nice young man and I think it probably had something to do with language barrier, communication issues. I recommended he start taking omeprazole which she already has, one pill every morning shortly before breakfast and that  we proceed with EGD at his soonest convenience for his left-sided abdominal pains. I will plan to biopsy for H. pylori at that time.   Rob Bunting, MD [facility] 01/07/2016, 3:09 PM  Cc: Tobey Grim, MD

## 2016-01-07 NOTE — Patient Instructions (Addendum)
You have been given a separate informational sheet regarding your tobacco use, the importance of quitting and local resources to help you quit. Take omeprazole one pill every morning 20-30 minutes before breakfast. You will be set up for an upper endoscopy for abdominal pain.

## 2016-01-10 ENCOUNTER — Encounter (HOSPITAL_COMMUNITY): Payer: Self-pay | Admitting: Emergency Medicine

## 2016-01-10 ENCOUNTER — Ambulatory Visit (HOSPITAL_COMMUNITY)
Admission: EM | Admit: 2016-01-10 | Discharge: 2016-01-10 | Disposition: A | Discharge status: Home / Self Care | Payer: Medicaid Other | Attending: Family Medicine | Admitting: Family Medicine

## 2016-01-10 DIAGNOSIS — R0982 Postnasal drip: Secondary | ICD-10-CM

## 2016-01-10 DIAGNOSIS — J029 Acute pharyngitis, unspecified: Secondary | ICD-10-CM

## 2016-01-10 DIAGNOSIS — J302 Other seasonal allergic rhinitis: Secondary | ICD-10-CM | POA: Diagnosis not present

## 2016-01-10 DIAGNOSIS — T700XXA Otitic barotrauma, initial encounter: Secondary | ICD-10-CM

## 2016-01-10 DIAGNOSIS — R202 Paresthesia of skin: Secondary | ICD-10-CM | POA: Diagnosis not present

## 2016-01-10 MED ORDER — CHLORPHEN-PE-ACETAMINOPHEN 4-10-325 MG PO TABS: ORAL_TABLET | ORAL | Status: DC

## 2016-01-10 NOTE — ED Provider Notes (Addendum)
CSN: [id number]     Arrival date & time 01/10/16  1855 History   First MD Initiated Contact with Patient 01/10/16 1943     Chief Complaint  Patient presents with  . Otalgia   (Consider location/radiation/quality/duration/timing/severity/associated sxs/prior Treatment) HPI Comments: 30 year old male who speaks Arabic and well-known to the urgent care returns for similar symptoms as seen before which include earache, nasal congestion, runny nose, dizziness, PND and numbness all over the body. States he feels hot all over but has no fever. Other complaints are weakness in the legs in addition to the numbness.   History reviewed. No pertinent past medical history. Past Surgical History  Procedure Laterality Date  . Appendectomy    . Eye surgery     Family History  Problem Relation Age of Onset  . Colon cancer Neg Hx    Social History  Substance Use Topics  . Smoking status: Current Some Day Smoker    Types: Cigarettes  . Smokeless tobacco: Never Used     Comment: tobacco info given 01/07/16  . Alcohol Use: No    Review of Systems  Constitutional: Positive for fever. Negative for chills, diaphoresis, activity change and fatigue.  HENT: Positive for congestion, ear pain, postnasal drip, rhinorrhea and sore throat. Negative for facial swelling and trouble swallowing.   Eyes: Negative for pain, discharge and redness.  Respiratory: Positive for cough. Negative for chest tightness and shortness of breath.   Cardiovascular: Negative.   Gastrointestinal: Negative.   Musculoskeletal: Negative.  Negative for neck pain and neck stiffness.  Skin: Negative for rash.  Neurological: Negative.   All other systems reviewed and are negative.   Allergies  Dicyclomine  Home Medications   Prior to Admission medications   Medication Sig Start Date End Date Taking? Authorizing Provider  Chlorphen-PE-Acetaminophen 4-10-325 MG TABS Take 1 tablet every 4-6 hours as needed for drainage and  stuffiness. 01/10/16   Hayden Rasmussenavid Bartt Gonzaga, NP  hyoscyamine (LEVBID) 0.375 MG 12 hr tablet Take 1 tablet (0.375 mg total) by mouth 2 (two) times daily. 12/09/15   Linna HoffJames D Kindl, MD   Meds Ordered and Administered this Visit  Medications - No data to display  BP 124/72 mmHg  Pulse 82  Temp(Src) 98.1 F (36.7 C) (Oral)  Resp 16  SpO2 100% No data found.   Physical Exam  Constitutional: He is oriented to person, place, and time. He appears well-developed and well-nourished. No distress.  HENT:  Bilateral TMs are retracted. No erythema or effusion.  Oropharynx with minor erythema, clear PND and much cobblestoning.  Eyes: EOM are normal. Pupils are equal, round, and reactive to light.  Mild bilateral lower conjunctiva will erythema. No drainage.  Neck: Normal range of motion. Neck supple.  Cardiovascular: Normal rate, regular rhythm and normal heart sounds.   Pulmonary/Chest: Effort normal and breath sounds normal. No respiratory distress. He has no wheezes.  Musculoskeletal: Normal range of motion. He exhibits no edema.  Lymphadenopathy:    He has no cervical adenopathy.  Neurological: He is alert and oriented to person, place, and time.  Skin: Skin is warm and dry. No rash noted.  Psychiatric: He has a normal mood and affect.  Nursing note and vitals reviewed.   ED Course  Procedures (including critical care time)  Labs Review Labs Reviewed - No data to display  Imaging Review No results found.   Visual Acuity Review  Right Eye Distance:   Left Eye Distance:   Bilateral Distance:    Right  Eye Near:   Left Eye Near:    Bilateral Near:         MDM   1. Other seasonal allergic rhinitis   2. PND (post-nasal drip)   3. Barotitis media, initial encounter   4. Allergic pharyngitis   5. Paresthesia    Saline nasal spray used frequently. Ibuprofen 600 mg every 6 hours as needed for pain, discomfort or fever. Drink plenty of fluids and stay well-hydrated. Take the  prescription medication as directed. May cause drowsiness. Meds ordered this encounter  Medications  . Chlorphen-PE-Acetaminophen 4-10-325 MG TABS    Sig: Take 1 tablet every 4-6 hours as needed for drainage and stuffiness.    Dispense:  24 tablet    Refill:  0    Order Specific Question:  Supervising Provider    Answer:  Linna HoffKINDL, JAMES D [[id number]]       Hayden Rasmussenavid Tersea Aulds, NP 01/10/16 2027  Hayden Rasmussenavid Zaniyah Wernette, NP 01/10/16 2049

## 2016-01-10 NOTE — ED Notes (Signed)
Bilateral ear pain for 2 weeks.  Sinus drainage, fever and weakness in legs.

## 2016-01-10 NOTE — Discharge Instructions (Signed)
Allergic Rhinitis Saline nasal spray used frequently. Ibuprofen 600 mg every 6 hours as needed for pain, discomfort or fever. Drink plenty of fluids and stay well-hydrated. Take the prescription medication as directed. May cause drowsiness.   Allergic rhinitis is when the mucous membranes in the nose respond to allergens. Allergens are particles in the air that cause your body to have an allergic reaction. This causes you to release allergic antibodies. Through a chain of events, these eventually cause you to release histamine into the blood stream. Although meant to protect the body, it is this release of histamine that causes your discomfort, such as frequent sneezing, congestion, and an itchy, runny nose.  CAUSES Seasonal allergic rhinitis (hay fever) is caused by pollen allergens that may come from grasses, trees, and weeds. Year-round allergic rhinitis (perennial allergic rhinitis) is caused by allergens such as house dust mites, pet dander, and mold spores. SYMPTOMS  Nasal stuffiness (congestion).  Itchy, runny nose with sneezing and tearing of the eyes. DIAGNOSIS Your health care provider can help you determine the allergen or allergens that trigger your symptoms. If you and your health care provider are unable to determine the allergen, skin or blood testing may be used. Your health care provider will diagnose your condition after taking your health history and performing a physical exam. Your health care provider may assess you for other related conditions, such as asthma, pink eye, or an ear infection. TREATMENT Allergic rhinitis does not have a cure, but it can be controlled by:  Medicines that block allergy symptoms. These may include allergy shots, nasal sprays, and oral antihistamines.  Avoiding the allergen. Hay fever may often be treated with antihistamines in pill or nasal spray forms. Antihistamines block the effects of histamine. There are over-the-counter medicines that may  help with nasal congestion and swelling around the eyes. Check with your health care provider before taking or giving this medicine. If avoiding the allergen or the medicine prescribed do not work, there are many new medicines your health care provider can prescribe. Stronger medicine may be used if initial measures are ineffective. Desensitizing injections can be used if medicine and avoidance does not work. Desensitization is when a patient is given ongoing shots until the body becomes less sensitive to the allergen. Make sure you follow up with your health care provider if problems continue. HOME CARE INSTRUCTIONS It is not possible to completely avoid allergens, but you can reduce your symptoms by taking steps to limit your exposure to them. It helps to know exactly what you are allergic to so that you can avoid your specific triggers. SEEK MEDICAL CARE IF:  You have a fever.  You develop a cough that does not stop easily (persistent).  You have shortness of breath.  You start wheezing.  Symptoms interfere with normal daily activities.   This information is not intended to replace advice given to you by your health care provider. Make sure you discuss any questions you have with your health care provider.   Document Released: 03/31/2001 Document Revised: 07/27/2014 Document Reviewed: 03/13/2013 Elsevier Interactive Patient Education 2016 [employer]  [employer] Warner[employer]ot[employer] Media Barotitis media is inflammation of your middle ear. This occurs when the auditory tube (eustachian tube) leading from the back of your nose (nasopharynx) to your eardrum is blocked. This blockage may result from a cold, environmental allergies, or an upper respiratory infection. Unresolved barotitis media may lead to damage or hearing loss (barotrauma), which may become permanent. HOME CARE INSTRUCTIONS   Use  medicines as recommended by your health care provider. Over-the-counter medicines will help unblock the canal and  can help during times of air travel.  Do not put anything into your ears to clean or unplug them. Eardrops will not be helpful.  Do not swim, dive, or fly until your health care provider says it is all right to do so. If these activities are necessary, chewing gum with frequent, forceful swallowing may help. It is also helpful to hold your nose and gently blow to pop your ears for equalizing pressure changes. This forces air into the eustachian tube.  Only take over-the-counter or prescription medicines for pain, discomfort, or fever as directed by your health care provider.  A decongestant may be helpful in decongesting the middle ear and make pressure equalization easier. SEEK MEDICAL CARE IF:  You experience a serious form of dizziness in which you feel as if the room is spinning and you feel nauseated (vertigo).  Your symptoms only involve one ear. SEEK IMMEDIATE MEDICAL CARE IF:   You develop a severe headache, dizziness, or severe ear pain.  You have bloody or pus-like drainage from your ears.  You develop a fever.  Your problems do not improve or become worse. MAKE SURE YOU:   Understand these instructions.  Will watch your condition.  Will get help right away if you are not doing well or get worse.   This information is not intended to replace advice given to you by your health care provider. Make sure you discuss any questions you have with your health care provider.   Document Released: 07/03/2000 Document Revised: 04/26/2013 Document Reviewed: 01/31/2013 Elsevier Interactive Patient Education 2016 Elsevier Inc.  Paresthesia Paresthesia is an abnormal burning or prickling sensation. This sensation is generally felt in the hands, arms, legs, or feet. However, it may occur in any part of the body. Usually, it is not painful. The feeling may be described as:  Tingling or numbness.  Pins and needles.  Skin crawling.  Buzzing.  Limbs falling  asleep.  Itching. Most people experience temporary (transient) paresthesia at some time in their lives. Paresthesia may occur when you breathe too quickly (hyperventilation). It can also occur without any apparent cause. Commonly, paresthesia occurs when pressure is placed on a nerve. The sensation quickly goes away after the pressure is removed. For some people, however, paresthesia is a long-lasting (chronic) condition that is caused by an underlying disorder. If you continue to have paresthesia, you may need further medical evaluation. HOME CARE INSTRUCTIONS Watch your condition for any changes. Taking the following actions may help to lessen any discomfort that you are feeling:  Avoid drinking alcohol.  Try acupuncture or massage to help relieve your symptoms.  Keep all follow-up visits as directed by your health care provider. This is important. SEEK MEDICAL CARE IF:  You continue to have episodes of paresthesia.  Your burning or prickling feeling gets worse when you walk.  You have pain, cramps, or dizziness.  You develop a rash. SEEK IMMEDIATE MEDICAL CARE IF:  You feel weak.  You have trouble walking or moving.  You have problems with speech, understanding, or vision.  You feel confused.  You cannot control your bladder or bowel movements.  You have numbness after an injury.  You faint.   This information is not intended to replace advice given to you by your health care provider. Make sure you discuss any questions you have with your health care provider.   Document Released: 06/26/2002  Document Revised: 11/20/2014 Document Reviewed: 07/02/2014 Elsevier Interactive Patient Education [employer] Elsevier Inc.

## 2016-01-17 ENCOUNTER — Ambulatory Visit (HOSPITAL_COMMUNITY)
Admission: EM | Admit: 2016-01-17 | Discharge: 2016-01-17 | Disposition: A | Discharge status: Home / Self Care | Payer: Medicaid Other | Attending: Emergency Medicine | Admitting: Emergency Medicine

## 2016-01-17 ENCOUNTER — Encounter (HOSPITAL_COMMUNITY): Payer: Self-pay

## 2016-01-17 DIAGNOSIS — H9201 Otalgia, right ear: Secondary | ICD-10-CM

## 2016-01-17 NOTE — ED Notes (Signed)
Bilateral ear pain for 2 weeks. Sinus drainage, fever and weakness in legs.  No acute distress

## 2016-01-17 NOTE — Discharge Instructions (Signed)
Earache An earache, also called otalgia, can be caused by many things. Pain from an earache can be sharp, dull, or burning. The pain may be temporary or constant. Earaches can be caused by problems with the ear, such as infection in either the middle ear or the ear canal, injury, impacted ear wax, middle ear pressure, or a foreign body in the ear. Ear pain can also result from problems in other areas. This is called referred pain. For example, pain can come from a sore throat, a tooth infection, or problems with the jaw or the joint between the jaw and the skull (temporomandibular joint, or TMJ). The cause of an earache is not always easy to identify. Watchful waiting may be appropriate for some earaches until a clear cause of the pain can be found. HOME CARE INSTRUCTIONS Watch your condition for any changes. The following actions may help to lessen any discomfort that you are feeling:  Take medicines only as directed by your health care provider. This includes ear drops.  Apply ice to your outer ear to help reduce pain.  Put ice in a plastic bag.  Place a towel between your skin and the bag.  Leave the ice on for 20 minutes, 2-3 times per day.  Do not put anything in your ear other than medicine that is prescribed by your health care provider.  Try resting in an upright position instead of lying down. This may help to reduce pressure in the middle ear and relieve pain.  Chew gum if it helps to relieve your ear pain.  Control any allergies that you have.  Keep all follow-up visits as directed by your health care provider. This is important. SEEK MEDICAL CARE IF:  Your pain does not improve within 2 days.  You have a fever.  You have new or worsening symptoms. SEEK IMMEDIATE MEDICAL CARE IF:  You have a severe headache.  You have a stiff neck.  You have difficulty swallowing.  You have redness or swelling behind your ear.  You have drainage from your ear.  You have hearing  loss.  You feel dizzy.   This information is not intended to replace advice given to you by your health care provider. Make sure you discuss any questions you have with your health care provider.   Document Released: 02/21/2004 Document Revised: 07/27/2014 Document Reviewed: 02/04/2014 Elsevier Interactive Patient Education 2016 [employer] Inc[employer]  

## 2016-01-17 NOTE — ED Provider Notes (Signed)
CSN: [id number]     Arrival date & time 01/17/16  1710 History   First MD Initiated Contact with Patient 01/17/16 1735     Chief Complaint  Patient presents with  . Otalgia   (Consider location/radiation/quality/duration/timing/severity/associated sxs/prior Treatment) HPI History obtained from patient/through friend interpreter Location:  Left ear Context/Duration: 3 days he should have been treated approximately one month ago for similar pain in his left ear states that it went away after the antibiotics but is back now.  Severity: 2   Quality: Aching Timing:       Constant     Home Treatment: Tylenol Associated symptoms: None  Family History:    History reviewed. No pertinent past medical history. Past Surgical History  Procedure Laterality Date  . Appendectomy    . Eye surgery     Family History  Problem Relation Age of Onset  . Colon cancer Neg Hx    Social History  Substance Use Topics  . Smoking status: Current Some Day Smoker    Types: Cigarettes  . Smokeless tobacco: Never Used     Comment: tobacco info given 01/07/16  . Alcohol Use: No    Review of Systems  Denies: HEADACHE, NAUSEA, ABDOMINAL PAIN, CHEST PAIN, CONGESTION, DYSURIA, SHORTNESS OF BREATH  Allergies  Dicyclomine  Home Medications   Prior to Admission medications   Medication Sig Start Date End Date Taking? Authorizing Provider  Chlorphen-PE-Acetaminophen 4-10-325 MG TABS Take 1 tablet every 4-6 hours as needed for drainage and stuffiness. 01/10/16   Hayden Rasmussenavid Mabe, NP  hyoscyamine (LEVBID) 0.375 MG 12 hr tablet Take 1 tablet (0.375 mg total) by mouth 2 (two) times daily. 12/09/15   Linna HoffJames D Kindl, MD   Meds Ordered and Administered this Visit  Medications - No data to display  BP 108/74 mmHg  Pulse 74  Temp(Src) 97.9 F (36.6 C) (Oral)  Resp 15  SpO2 99% No data found.   Physical Exam NURSES NOTES AND VITAL SIGNS REVIEWED. CONSTITUTIONAL: Well developed, well nourished, no acute  distress HEENT: normocephalic, atraumatic, TMs are clear bilaterally. EYES: Conjunctiva normal NECK:normal ROM, supple, no adenopathy PULMONARY:No respiratory distress, normal effort ABDOMINAL: Soft, ND, NT BS+, No CVAT MUSCULOSKELETAL: Normal ROM of all extremities,  SKIN: warm and dry without rash PSYCHIATRIC: Mood and affect, behavior are normal  ED Course  Procedures (including critical care time)  Labs Review Labs Reviewed - No data to display  Imaging Review No results found.   Visual Acuity Review  Right Eye Distance:   Left Eye Distance:   Bilateral Distance:    Right Eye Near:   Left Eye Near:    Bilateral Near:       Patient is referred to ear nose and throat for further evaluation of his otalgia. All other obvious source of pain at this time.  MDM   1. Otalgia, right     Patient is reassured that there are no issues that require transfer to higher level of care at this time or additional tests. Patient is advised to continue home symptomatic treatment. Patient is advised that if there are new or worsening symptoms to attend the emergency department, contact primary care provider, or return to UC. Instructions of care provided discharged home in stable condition.    THIS NOTE WAS GENERATED USING A VOICE RECOGNITION SOFTWARE PROGRAM. ALL REASONABLE EFFORTS  WERE MADE TO PROOFREAD THIS DOCUMENT FOR ACCURACY.  I have verbally reviewed the discharge instructions with the patient. A printed AVS was given to  the patient.  All questions were answered prior to discharge.      Tharon AquasFrank C Patrick, PA 01/17/16 2040

## 2016-01-19 ENCOUNTER — Emergency Department (HOSPITAL_COMMUNITY): Payer: Medicaid Other

## 2016-01-19 ENCOUNTER — Encounter (HOSPITAL_COMMUNITY): Payer: Self-pay | Admitting: *Deleted

## 2016-01-19 ENCOUNTER — Emergency Department (HOSPITAL_COMMUNITY)
Admission: EM | Admit: 2016-01-19 | Discharge: 2016-01-19 | Disposition: A | Discharge status: Home / Self Care | Payer: Medicaid Other | Attending: Emergency Medicine | Admitting: Emergency Medicine

## 2016-01-19 DIAGNOSIS — R51 Headache: Secondary | ICD-10-CM | POA: Insufficient documentation

## 2016-01-19 DIAGNOSIS — R0989 Other specified symptoms and signs involving the circulatory and respiratory systems: Secondary | ICD-10-CM | POA: Diagnosis not present

## 2016-01-19 DIAGNOSIS — R519 Headache, unspecified: Secondary | ICD-10-CM

## 2016-01-19 DIAGNOSIS — F17210 Nicotine dependence, cigarettes, uncomplicated: Secondary | ICD-10-CM | POA: Insufficient documentation

## 2016-01-19 DIAGNOSIS — R208 Other disturbances of skin sensation: Secondary | ICD-10-CM

## 2016-01-19 MED ORDER — FLUTICASONE PROPIONATE 50 MCG/ACT NA SUSP: 2.0000 | Freq: Every day | NASAL | Status: DC

## 2016-01-19 MED ORDER — IBUPROFEN 800 MG PO TABS: 800.0000 mg | ORAL_TABLET | Freq: Three times a day (TID) | ORAL | Status: DC

## 2016-01-19 NOTE — Discharge Instructions (Signed)
Take the prescribed medication as directed.  Nasal spray should help with burning sensation in the nose. Follow-up with neurology if headache persists. Otherwise you may follow-up with your primary care doctor. Return to the ED for new or worsening symptoms.

## 2016-01-19 NOTE — ED Provider Notes (Signed)
CSN: [id number]     Arrival date & time 01/19/16  2005 History   First MD Initiated Contact with Patient 01/19/16 2013     Chief Complaint  Patient presents with  . Headache     (Consider location/radiation/quality/duration/timing/severity/associated sxs/prior Treatment) Patient is a 30 y.o. male presenting with headaches. The history is provided by the patient and medical records. The history is limited by a language barrier. A language interpreter was used.  Headache   Difficulty history due to language barrier.  Arabic interpreter used for HPI.  30 year old male here with headache for the past 2 weeks. He states pain began along the bridge of his nose and goes back into his eyes and into the back of his head. He states his a throbbing pain. He states he has had intermittent dizziness as well for the past 2 months. He states when this occurs he just feels slightly off balance but denies any room spinning sensation. States he has an occasional blurriness in his vision when this occurs. He reports subjective fever but denies chills or sweats. He denies any neck pain or stiffness.  He has been seen at urgent care 4 times in the past month for similar complaints. He completed a course of antibiotics for possible sinusitis and otitis media but states he has not noticed any change in his symptoms. He states the meclizine made him drowsy.  Denies any nasal congestion, rhinorrhea, cough, sore throat, or other upper respiratory symptoms. He denies any nausea, vomiting, or diarrhea. He was referred to ENT for his symptoms but has not followed up. He has never been diagnosed with migraine headaches. He is not currently on any type of anticoagulation.  No numbness/weakness of his extremities.  No difficulty ambulating.  No confusion.  States headache is currently mild, rates it at a 5.  Denies current dizziness or blurred vision.  No meds taken today.  Has never had any imaging of his brain.  VSS.  History  reviewed. No pertinent past medical history. Past Surgical History  Procedure Laterality Date  . Appendectomy    . Eye surgery     Family History  Problem Relation Age of Onset  . Colon cancer Neg Hx    Social History  Substance Use Topics  . Smoking status: Current Some Day Smoker    Types: Cigarettes  . Smokeless tobacco: Never Used     Comment: tobacco info given 01/07/16  . Alcohol Use: No    Review of Systems  Neurological: Positive for headaches.  All other systems reviewed and are negative.     Allergies  Dicyclomine  Home Medications   Prior to Admission medications   Medication Sig Start Date End Date Taking? Authorizing Provider  Chlorphen-PE-Acetaminophen 4-10-325 MG TABS Take 1 tablet every 4-6 hours as needed for drainage and stuffiness. 01/10/16   Hayden Rasmussenavid Mabe, NP  hyoscyamine (LEVBID) 0.375 MG 12 hr tablet Take 1 tablet (0.375 mg total) by mouth 2 (two) times daily. 12/09/15   Linna HoffJames D Kindl, MD   BP 103/79 mmHg  Pulse 83  Temp(Src) 97.8 F (36.6 C) (Oral)  Resp 18  Ht 6\' 2"  (1.88 m)  SpO2 100%   Physical Exam  Constitutional: He is oriented to person, place, and time. He appears well-developed and well-nourished. No distress.  HENT:  Head: Normocephalic and atraumatic.  Right Ear: Tympanic membrane and external ear normal.  Left Ear: Tympanic membrane and external ear normal.  Nose: Nose normal.  Mouth/Throat: Uvula is midline,  oropharynx is clear and moist and mucous membranes are normal.  No sinus tenderness  Eyes: Conjunctivae and EOM are normal. Pupils are equal, round, and reactive to light.  Neck: Normal range of motion and full passive range of motion without pain. Neck supple. No rigidity.  No rigidity, no meningismus  Cardiovascular: Normal rate, regular rhythm and normal heart sounds.   No murmur heard. Pulmonary/Chest: Effort normal and breath sounds normal. No respiratory distress. He has no wheezes. He has no rhonchi.  Abdominal: Soft.  Bowel sounds are normal. There is no tenderness. There is no guarding.  Musculoskeletal: Normal range of motion. He exhibits no edema.  Neurological: He is alert and oriented to person, place, and time. He has normal strength. He displays no tremor. No cranial nerve deficit or sensory deficit. He displays no seizure activity.  AAOx3, answering questions and following commands appropriately; equal strength UE and LE bilaterally; CN grossly intact; moves all extremities appropriately without ataxia; no focal neuro deficits or facial asymmetry appreciated  Skin: Skin is warm and dry. No rash noted. He is not diaphoretic.  Psychiatric: He has a normal mood and affect. His behavior is normal. Thought content normal.  Nursing note and vitals reviewed.   ED Course  Procedures (including critical care time) Labs Review Labs Reviewed - No data to display  Imaging Review No results found. I have personally reviewed and evaluated these images and lab results as part of my medical decision-making.   EKG Interpretation None      MDM   Final diagnoses:  Headache, unspecified headache type  Burning sensation of nose   30 year old male here with intermittent headache, dizziness, and blurred vision. This is his fifth visit for similar symptoms. He has seen urgent care the times before. He tried antibiotics for alleged sinusitis without relief. Patient is afebrile, nontoxic. His neurologic exam is nonfocal. He has no clinical signs or symptoms to suggest meningitis. Given multiple visits for same, CT head obtained which is negative for acute intracranial findings. No evidence of sinusitis either. Results discussed with patient, he acknowledged understanding. Will give referral to neurologist if headache persists. He also now reports a burning sensation in his nose after getting salt water up his nose while swimming in the ocean-- will start on flonase for this.  Discussed plan with patient via language  interpreter, he acknowledged understanding and agreed with plan of care.  Return precautions given for new or worsening symptoms.  Garlon HatchetLisa M Hobie Kohles, PA-C 01/19/16 2154  Jacalyn LefevreJulie Haviland, MD 01/19/16 2219

## 2016-01-19 NOTE — ED Notes (Signed)
The pt is c/o a headache for 2 weeks  With dizziness  No known injury  He speaks no english  He has people answering for him

## 2016-01-24 ENCOUNTER — Encounter: Payer: Self-pay | Admitting: Family Medicine

## 2016-01-24 ENCOUNTER — Ambulatory Visit (INDEPENDENT_AMBULATORY_CARE_PROVIDER_SITE_OTHER): Payer: Medicaid Other | Admitting: Family Medicine

## 2016-01-24 VITALS — BP 116/72 | HR 64 | Temp 98.0°F | Ht 74.0 in | Wt 153.0 lb

## 2016-01-24 DIAGNOSIS — H547 Unspecified visual loss: Secondary | ICD-10-CM | POA: Diagnosis not present

## 2016-01-24 DIAGNOSIS — Z9889 Other specified postprocedural states: Secondary | ICD-10-CM

## 2016-01-24 DIAGNOSIS — R202 Paresthesia of skin: Secondary | ICD-10-CM | POA: Diagnosis not present

## 2016-01-24 DIAGNOSIS — R1013 Epigastric pain: Secondary | ICD-10-CM

## 2016-01-24 DIAGNOSIS — R51 Headache: Secondary | ICD-10-CM

## 2016-01-24 DIAGNOSIS — G8929 Other chronic pain: Secondary | ICD-10-CM

## 2016-01-24 MED ORDER — OMEPRAZOLE 40 MG PO CPDR: 40.0000 mg | DELAYED_RELEASE_CAPSULE | Freq: Every day | ORAL | Status: DC

## 2016-01-24 MED ORDER — AMITRIPTYLINE HCL 25 MG PO TABS: 25.0000 mg | ORAL_TABLET | Freq: Every day | ORAL | Status: DC

## 2016-01-24 NOTE — Patient Instructions (Addendum)
I will send you to a neurologist to help with the tingling that you are having.  This medicine is called amitryptline.  This should help with the hunger and the tingling.   Take the Omeprazole once daily for stomach pain.  This should start to work in a couple of days.    Come back to see me in 3-4 weeks to make sure you're doing okay

## 2016-01-24 NOTE — Assessment & Plan Note (Signed)
Possibility of atypical migraines with his symptoms, especially with odd full body paresthesias radiating from his head. He is interested in neurology referral for further input.   I am starting him on amitriptyline to help reduce paresthesias.   Would not be surprised if there is also psych component to this as he is [place] refugee.  Hopefully elavil can help with that, will likely need to increase dose for maximal effect.  Neuro referral today.

## 2016-01-24 NOTE — Progress Notes (Signed)
Subjective:    Daniel Nelson is a 30 y.o. male who presents to [facility] today for myriad complaints that can be boiled down to 2 main issues:  1.  Headaches:  Occur daily.  Occasionally describes real head pain with headaches, otherwise describes numbness and tingling which starts in his head and then moves to whole body.  Does not usually lateralize.  Paresthesias can go all the way to his legs.  Describes blurry vision, worse in Right eye.  He's been seen in ED for this multiple times, most recently recommended to go to neurology for further evaluation.  Taking unknown medications for relief without relief.    2. Stomach pain:  Describes as sharp stomach pain, worse after meals.  Decreased appetitie.  Losing weight as he's not eating well.  Difficulty sleeping at night sometimes because of his stomach pain.  No melena, no hematochezia.    ROS as above per HPI, otherwise neg.    The following portions of the patient's history were reviewed and updated as appropriate: allergies, current medications, past medical history, family and social history, and problem list. Patient is a nonsmoker.    PMH reviewed.  No past medical history on file. Past Surgical History  Procedure Laterality Date  . Appendectomy    . Eye surgery      Medications reviewed. Current Outpatient Prescriptions  Medication Sig Dispense Refill  . Chlorphen-PE-Acetaminophen 4-10-325 MG TABS Take 1 tablet every 4-6 hours as needed for drainage and stuffiness. (Patient not taking: Reported on 01/19/2016) 24 tablet 0  . fluticasone (FLONASE) 50 MCG/ACT nasal spray Place 2 sprays into both nostrils daily. 16 g 0  . hyoscyamine (LEVBID) 0.375 MG 12 hr tablet Take 1 tablet (0.375 mg total) by mouth 2 (two) times daily. 60 tablet 0  . ibuprofen (ADVIL,MOTRIN) 800 MG tablet Take 1 tablet (800 mg total) by mouth 3 (three) times daily. 21 tablet 0   No current facility-administered medications for this visit.     Objective:   Physical Exam BP 116/72 mmHg  Pulse 64  Temp(Src) 98 F (36.7 C) (Oral)  Ht '6\' 2"'$  (1.88 m)  Wt 153 lb (69.4 kg)  BMI 19.64 kg/m2 Gen:  Alert, cooperative patient who appears stated age in no acute distress.  Vital signs reviewed. HEENT: EOMI,  MMM.  Mayo/AT.  Nontender to scalp on my exam.   Cardiac:  Regular rate and rhythm without murmur auscultated.  Good S1/S2. Pulm:  Clear to auscultation bilaterally with good air movement.  No wheezes or rales noted.   MSK:  5/5 BL upper and lower extremities.  Exts: Non edematous BL  LE, warm and well perfused.  Neuro:  No focal deficts noted on exam today.  Sensation intact on my exam.    No results found for this or any previous visit (from the past 72 hour(s)).

## 2016-01-24 NOTE — Assessment & Plan Note (Addendum)
With history of treated h pylori. Not on PPI. Worse with food.  Trial of PPI today.  Less likely abdominal migraine, though does have chronic headaches as above.  Possibly also worsened by use of ibuprofen for headaches.

## 2016-01-24 NOTE — Assessment & Plan Note (Signed)
With decreased vision in Right eye.  20/100.  Plan to refer to opthalmology for further evaluation.  Do not know what type of eye surgery he had at age 30.  This could be contributing to headaches.

## 2016-01-28 ENCOUNTER — Encounter (HOSPITAL_COMMUNITY): Payer: Self-pay | Admitting: Emergency Medicine

## 2016-01-28 ENCOUNTER — Emergency Department (HOSPITAL_COMMUNITY): Payer: Medicaid Other

## 2016-01-28 DIAGNOSIS — R1013 Epigastric pain: Secondary | ICD-10-CM | POA: Diagnosis not present

## 2016-01-28 DIAGNOSIS — F17210 Nicotine dependence, cigarettes, uncomplicated: Secondary | ICD-10-CM | POA: Insufficient documentation

## 2016-01-28 DIAGNOSIS — Z79899 Other long term (current) drug therapy: Secondary | ICD-10-CM | POA: Diagnosis not present

## 2016-01-28 DIAGNOSIS — R079 Chest pain, unspecified: Secondary | ICD-10-CM | POA: Diagnosis present

## 2016-01-28 LAB — CBC
HCT: 42.8 % (ref 39.0–52.0)
HEMOGLOBIN: 14.7 g/dL (ref 13.0–17.0)
MCH: 31.2 pg (ref 26.0–34.0)
MCHC: 34.3 g/dL (ref 30.0–36.0)
MCV: 90.9 fL (ref 78.0–100.0)
Platelets: 152 10*3/uL (ref 150–400)
RBC: 4.71 MIL/uL (ref 4.22–5.81)
RDW: 12.3 % (ref 11.5–15.5)
WBC: 8.4 10*3/uL (ref 4.0–10.5)

## 2016-01-28 LAB — I-STAT TROPONIN, ED: TROPONIN I, POC: 0 ng/mL (ref 0.00–0.08)

## 2016-01-28 LAB — BASIC METABOLIC PANEL
ANION GAP: 6 (ref 5–15)
BUN: 10 mg/dL (ref 6–20)
CALCIUM: 9.5 mg/dL (ref 8.9–10.3)
CO2: 27 mmol/L (ref 22–32)
Chloride: 105 mmol/L (ref 101–111)
Creatinine, Ser: 0.94 mg/dL (ref 0.61–1.24)
Glucose, Bld: 102 mg/dL — ABNORMAL HIGH (ref 65–99)
POTASSIUM: 3.6 mmol/L (ref 3.5–5.1)
SODIUM: 138 mmol/L (ref 135–145)

## 2016-01-28 LAB — LIPASE, BLOOD: LIPASE: 28 U/L (ref 11–51)

## 2016-01-28 NOTE — ED Notes (Signed)
Pt reports 1 hr ago he had a sudden onset of sharp pains in his chest with shortness of breath and left arm pain and numbness. Pt also reports history of "galbladder" problems. also has pain in RUQ.   Denies n/v/d.

## 2016-01-28 NOTE — Care Management (Signed)
ED CM reviewed patient's record. Patient has had 6 ED visits in the past 6 months with similar complaints. [facility] with a sudden onset of sharp pains in his chest with shortness of breath and left arm pain and numbness. Patient is a [place] refugee [facility]. This CM will send in basket message to clinic CM to follow up with patient tomorrow.

## 2016-01-29 ENCOUNTER — Emergency Department (HOSPITAL_COMMUNITY)
Admission: EM | Admit: 2016-01-29 | Discharge: 2016-01-29 | Disposition: A | Discharge status: Home / Self Care | Payer: Medicaid Other | Attending: Emergency Medicine | Admitting: Emergency Medicine

## 2016-01-29 DIAGNOSIS — R1013 Epigastric pain: Secondary | ICD-10-CM

## 2016-01-29 LAB — HIV ANTIBODY (ROUTINE TESTING W REFLEX): HIV SCREEN 4TH GENERATION: NONREACTIVE

## 2016-01-29 MED ORDER — ONDANSETRON 4 MG PO TBDP
8.0000 mg | ORAL_TABLET | Freq: Once | ORAL | Status: AC
  Administered 2016-01-29: 8 mg via ORAL
  Filled 2016-01-29: qty 2

## 2016-01-29 MED ORDER — ONDANSETRON HCL 4 MG PO TABS: 4.0000 mg | ORAL_TABLET | Freq: Four times a day (QID) | ORAL | Status: DC

## 2016-01-29 MED ORDER — MULTI-VITAMIN/MINERALS PO TABS: 1.0000 | ORAL_TABLET | Freq: Every day | ORAL | Status: DC

## 2016-01-29 MED ORDER — LORAZEPAM 1 MG PO TABS
0.5000 mg | ORAL_TABLET | Freq: Once | ORAL | Status: AC
  Administered 2016-01-29: 0.5 mg via ORAL
  Filled 2016-01-29: qty 1

## 2016-01-29 NOTE — ED Provider Notes (Signed)
CSN: [id number]     Arrival date & time 01/28/16  2210 History   First MD Initiated Contact with Patient 01/29/16 [phone]     Chief Complaint  Patient presents with  . Chest Pain     (Consider location/radiation/quality/duration/timing/severity/associated sxs/prior Treatment) HPI   The patient is a [place] refugee who has had multiple visits to the emergency department, urgent care and his primary care doctor for multiple complaints. He's currently on Elavil but does not feel like it is working. He comes in today with complaints of diffuse abdominal pain that comes and goes as well as chest pain that comes and goes. His friend who is present says that his friend has lost a lot of weight over the past couple months. When asked about depression, the patient admits that he is depressed, he is not suicidal or homicidal. He feels like when he eats the food won't go down, he feels like when he sleeps that he is dizzy, his eyes feel weird. He reports all around feeling weak. He was diagnosed with H.Pylori in January of 2017 and was treated with an antibiotic. The patient reports being complaint with this. The patient also lists off multiple other vague non related complaints. The phone interpretor company was not able to locate an Print production planner and I had to rely on family members that were present.  History reviewed. No pertinent past medical history. Past Surgical History  Procedure Laterality Date  . Appendectomy    . Eye surgery     Family History  Problem Relation Age of Onset  . Colon cancer Neg Hx    Social History  Substance Use Topics  . Smoking status: Current Some Day Smoker    Types: Cigarettes  . Smokeless tobacco: Never Used     Comment: tobacco info given 01/07/16  . Alcohol Use: No    Review of Systems  Review of Systems All other systems negative except as documented in the HPI. All pertinent positives and negatives as reviewed in the HPI.   Allergies   Dicyclomine  Home Medications   Prior to Admission medications   Medication Sig Start Date End Date Taking? Authorizing Provider  amitriptyline (ELAVIL) 25 MG tablet Take 1 tablet (25 mg total) by mouth at bedtime. 01/24/16   Tobey Grim, MD  Chlorphen-PE-Acetaminophen 4-10-325 MG TABS Take 1 tablet every 4-6 hours as needed for drainage and stuffiness. Patient not taking: Reported on 01/19/2016 01/10/16   Hayden Rasmussen, NP  fluticasone [facility]) 50 MCG/ACT nasal spray Place 2 sprays into both nostrils daily. 01/19/16   Garlon Hatchet, PA-C  hyoscyamine (LEVBID) 0.375 MG 12 hr tablet Take 1 tablet (0.375 mg total) by mouth 2 (two) times daily. 12/09/15   Linna Hoff, MD  ibuprofen (ADVIL,MOTRIN) 800 MG tablet Take 1 tablet (800 mg total) by mouth 3 (three) times daily. 01/19/16   Garlon Hatchet, PA-C  Multiple Vitamins-Minerals (MULTIVITAMIN WITH MINERALS) tablet Take 1 tablet by mouth daily. 01/29/16   Brittne Kawasaki Neva Seat, PA-C  omeprazole (PRILOSEC) 40 MG capsule Take 1 capsule (40 mg total) by mouth daily. 01/24/16   Tobey Grim, MD  ondansetron (ZOFRAN) 4 MG tablet Take 1 tablet (4 mg total) by mouth every 6 (six) hours. 01/29/16   Niah Heinle Neva Seat, PA-C   BP 122/86 mmHg  Pulse 71  Temp(Src) 98.2 F (36.8 C) (Oral)  Resp 16  Ht 6' (1.829 m)  Wt 69.4 kg  BMI 20.75 kg/m2  SpO2 100% Physical Exam  Constitutional: He appears well-developed and well-nourished. No distress.  HENT:  Head: Normocephalic and atraumatic.  Right Ear: Tympanic membrane and ear canal normal.  Left Ear: Tympanic membrane and ear canal normal.  Nose: Nose normal.  Mouth/Throat: Uvula is midline, oropharynx is clear and moist and mucous membranes are normal.  Eyes: Pupils are equal, round, and reactive to light.  Neck: Normal range of motion. Neck supple.  Cardiovascular: Normal rate and regular rhythm.   Pulmonary/Chest: Effort normal. No respiratory distress. He has no wheezes.  Abdominal: Soft. He exhibits no  distension. There is no tenderness. There is no rebound.  No signs of abdominal distention  Musculoskeletal:  No LE swelling  Neurological: He is alert.  Acting at baseline  Skin: Skin is warm and dry. No rash noted.  Psychiatric: His mood appears not anxious. He does not exhibit a depressed mood. He expresses no homicidal and no suicidal ideation.  Nursing note and vitals reviewed.   ED Course  Procedures (including critical care time) Labs Review Labs Reviewed  BASIC METABOLIC PANEL - Abnormal; Notable for the following:    Glucose, Bld 102 (*)    All other components within normal limits  CBC  LIPASE, BLOOD  HIV ANTIBODY (ROUTINE TESTING)  H. PYLORI ANTIBODY, IGG  I-STAT TROPOININ, ED    Imaging Review Dg Chest 2 View  01/28/2016  CLINICAL DATA:  Acute onset sharp chest pain, shortness of breath and LEFT arm pain and numbness. Similar transient symptoms for 4 months. EXAM: CHEST  2 VIEW COMPARISON:  Chest radiograph Nov 25, 2015 FINDINGS: Cardiomediastinal silhouette is normal. The lungs are clear without pleural effusions or focal consolidations. Similar hyperaeration and mild bronchitic changes. Trachea projects midline and there is no pneumothorax. Soft tissue planes and included osseous structures are non-suspicious. IMPRESSION: Similar hyper aeration hands mild bronchitic changes. Electronically Signed   By: Awilda Metroourtnay  Bloomer M.D.   On: 01/28/2016 23:08   I have personally reviewed and evaluated these images and lab results as part of my medical decision-making.   EKG Interpretation None      MDM   Final diagnoses:  Epigastric pain    Patient has depressed affect. Denies HI/SI. He is a [place] refugee and I believe depression may be a significant factor for his symptoms. He has been referred to Neurology and has an appt on August 2.   H.Pylori and HIV test sent out. Will follow-up on these. Pt request multivitamin. I suggest he can try drinking Ensure to keep weight.  Will also give Zofran for nausea. He needs to talk to his PCP regarding Elavil not working for his pains/depression.  Patient has had a negative troponin, normal EKG, nonacute chest x-ray. BMP and CBC as well as lipase are unremarkable.  Medications  ondansetron (ZOFRAN-ODT) disintegrating tablet 8 mg (8 mg Oral Given 01/29/16 0322)  LORazepam (ATIVAN) tablet 0.5 mg (0.5 mg Oral Given 01/29/16 0324)   Patient reports improvement with Zofran and Ativan in the ED.  I discussed results, diagnoses and plan with [facility]. They voice there understanding and questions were answered. We discussed follow-up recommendations and return precautions.   Marlon Peliffany Kerisha Goughnour, PA-C 01/29/16 [id number]  Shon Batonourtney F Horton, MD 01/30/16 [phone]

## 2016-01-29 NOTE — ED Provider Notes (Signed)
[phone]    Donnetta SimpersYousef [phone]    [name] (wife)   Daniel Peliffany Shanicka Oldenkamp, PA-C 01/29/16 [id number]  Shon Batonourtney F Horton, MD 01/29/16 2257

## 2016-01-30 LAB — H. PYLORI ANTIBODY, IGG: H Pylori IgG: 1.8 U/mL — ABNORMAL HIGH (ref 0.0–0.8)

## 2016-01-31 ENCOUNTER — Encounter: Payer: Self-pay | Admitting: Family Medicine

## 2016-01-31 NOTE — Progress Notes (Signed)
Patient ID: Daniel Nelson, male   DOB: 07/08/1986, 30 y.o.   MRN: [id number] Report from ED about + H.Pylori. It is unclear if patient had been treated before. I will have white team contact him to schedule follow up with his PCP for treatment.   Dr. Gwendolyn GrantWalden please follow.   Hello white team,  Please contact patient to schedule follow up appointment with Dr. Gwendolyn GrantWalden to discuss management of his positive H. Pylori test done at the ED.  This is important.

## 2016-02-03 NOTE — Progress Notes (Signed)
Patient scheduled to see me tomorrow.  Will discuss with him at that appoitnment.  Has been prescribed treatment in past, unclear if he picked this up.

## 2016-02-04 ENCOUNTER — Ambulatory Visit (INDEPENDENT_AMBULATORY_CARE_PROVIDER_SITE_OTHER): Payer: Medicaid Other | Admitting: Family Medicine

## 2016-02-04 VITALS — BP 109/64 | HR 68 | Temp 97.9°F | Wt 153.8 lb

## 2016-02-04 DIAGNOSIS — B9681 Helicobacter pylori [H. pylori] as the cause of diseases classified elsewhere: Secondary | ICD-10-CM | POA: Diagnosis not present

## 2016-02-04 DIAGNOSIS — G8929 Other chronic pain: Secondary | ICD-10-CM

## 2016-02-04 DIAGNOSIS — R51 Headache: Secondary | ICD-10-CM | POA: Diagnosis present

## 2016-02-04 DIAGNOSIS — A048 Other specified bacterial intestinal infections: Secondary | ICD-10-CM

## 2016-02-04 MED ORDER — AMOXICILL-CLARITHRO-LANSOPRAZ PO MISC: Freq: Two times a day (BID) | ORAL | Status: DC

## 2016-02-04 NOTE — Progress Notes (Signed)
Subjective:   [employer] intepreter used today for entire visit.    Daniel Nelson is a 30 y.o. male who [facility] for epigastric pain after ED visit:  1.  Epigastric pain:  Persists.  However, NOT his main complaint today.  He is more concerned for headaches. Went to ED about a week ago.  Diagnosed with H pylori based on labwork at that visit.  Told to FU with PCP, which is why he made today's appt.  Pain worse after eating.  No weight loss.  No vomiting.  Not taking any medications currently.    2.  Headaches:  Persist.  Started on elevail last visit.  However didn't pick this up because he doesn't like taking medications.  Prefers definitive diagnosis.  Describes onesided and frontal headaches.  Also with neck pain middle of cervical neck.  Worse when he flexes neck or extends.  States this causes paresthesias along left side of head when this occurs.  Headaches are present daily, multiple times a day.  Denies nausea or vomiting.  Occasional left sided blurry vision.  Describes dizziness when he tries to walk with headaches, has fallen at least once previously.  Also with some paresthesias that extend along Left leg that accompanies neck pain.    ROS as above per HPI, otherwise neg.    The following portions of the patient's history were reviewed and updated as appropriate: allergies, current medications, past medical history, family and social history, and problem list. Patient is a nonsmoker.    PMH reviewed.  No past medical history on file. Past Surgical History  Procedure Laterality Date  . Appendectomy    . Eye surgery      Medications reviewed. Current Outpatient Prescriptions  Medication Sig Dispense Refill  . amitriptyline (ELAVIL) 25 MG tablet Take 1 tablet (25 mg total) by mouth at bedtime. 30 tablet 1  . Chlorphen-PE-Acetaminophen 4-10-325 MG TABS Take 1 tablet every 4-6 hours as needed for drainage and stuffiness. (Patient not taking: Reported on 01/19/2016) 24  tablet 0  . fluticasone (FLONASE) 50 MCG/ACT nasal spray Place 2 sprays into both nostrils daily. 16 g 0  . hyoscyamine (LEVBID) 0.375 MG 12 hr tablet Take 1 tablet (0.375 mg total) by mouth 2 (two) times daily. 60 tablet 0  . ibuprofen (ADVIL,MOTRIN) 800 MG tablet Take 1 tablet (800 mg total) by mouth 3 (three) times daily. 21 tablet 0  . Multiple Vitamins-Minerals (MULTIVITAMIN WITH MINERALS) tablet Take 1 tablet by mouth daily. 30 tablet 0  . omeprazole (PRILOSEC) 40 MG capsule Take 1 capsule (40 mg total) by mouth daily. 30 capsule 1  . ondansetron (ZOFRAN) 4 MG tablet Take 1 tablet (4 mg total) by mouth every 6 (six) hours. 20 tablet 0   No current facility-administered medications for this visit.     Objective:   Physical Exam BP 109/64 mmHg  Pulse 68  Temp(Src) 97.9 F (36.6 C) (Oral)  Wt 153 lb 12.8 oz (69.763 kg) Gen:  Alert, cooperative patient who appears stated age in no acute distress.  Vital signs reviewed. HEENT: EOMI,  MMM Neck:  NO neck stiffness. Some point tenderness over middle of C4-6 spinal processes.  Good lateral movement, decreased forward and back flexion limited by pain.  Spurlings negative.  Cardiac:  Regular rate and rhythm without murmur auscultated.  Good S1/S2. Pulm:  Clear to auscultation bilaterally with good air movement.  No wheezes or rales noted.   Neuro:  CN II- XII intact.  No focal deficits noted on my exam today.  Papilledema not found today on exam.    No results found for this or any previous visit (from the past 72 hour(s)).

## 2016-02-04 NOTE — Patient Instructions (Signed)
Take the pills for the next 14 days to get rid of the stomach bacteria.  It is called H. Pylori.  The nursing staff will schedule you for your MRI.    I will check on the neurologist.    It was good to see you today

## 2016-02-06 ENCOUNTER — Telehealth: Payer: Self-pay | Admitting: Family Medicine

## 2016-02-06 NOTE — Telephone Encounter (Signed)
[facility] called because the patient is scheduled for Saturday to have some MRI's done. He has Medicaid so this has to go through [facility] jw

## 2016-02-06 NOTE — Assessment & Plan Note (Addendum)
Not clear cut picture for migraines.  No nausea when this occurs. Inconsistent blurry vision, but has longstanding issues with vision since he had eye surgery years ago.   Will refer to Neurology for further input today, especially as he's had some associated neck pain.  Also with paresthesias worse with neck movement.  This have been present for years.  Plan will be to obtain head/neck MRI to evaluate more fully.  No red flags via exam or history today.

## 2016-02-06 NOTE — Assessment & Plan Note (Signed)
Did not pick up/take medications last visit.   Will re-prescribe today.

## 2016-02-07 ENCOUNTER — Telehealth: Payer: Self-pay | Admitting: *Deleted

## 2016-02-07 NOTE — Telephone Encounter (Signed)
Pt had an appointment with Dr. Gwendolyn GrantWalden on 01/31/16 for this. Lamonte SakaiZimmerman Rumple, Shilynn Hoch D, [place]

## 2016-02-07 NOTE — Telephone Encounter (Signed)
-----   Message from Doreene ElandKehinde T Eniola, MD sent at 01/31/2016  3:03 PM EDT ----- Hello white team,  Please contact patient to schedule follow up appointment with Dr. Gwendolyn GrantWalden to discuss management of his positive H. Pylori test done at the ED.  This is important.  ----- Message -----    From: Marlon Peliffany Greene, PA-C    Sent: 01/31/2016   2:34 PM      To: Tobey GrimJeffrey H Walden, MD, Doreene ElandKehinde T Eniola, MD  Hi Dr. Gwendolyn GrantWalden (and Dr. Lum BabeEniola)- I'm writing regarding Daniel Nelson.  I saw him a few days ago in the ER at [facility] and sent out an H.Pylori test which came back a few days after his visit elevated at 1.8. Can you follow-up on this for him? Looks like he did treatment for H. Plori in January 2017 but according to a family friend present during his visit (the patient speaks only Arabic) he may not have understood the directions.  Thank you!  Marlon Peliffany Greene, PA-C

## 2016-02-08 ENCOUNTER — Other Ambulatory Visit: Payer: Self-pay | Admitting: Family Medicine

## 2016-02-08 ENCOUNTER — Other Ambulatory Visit: Payer: Self-pay | Admitting: Internal Medicine

## 2016-02-08 ENCOUNTER — Ambulatory Visit (HOSPITAL_COMMUNITY)
Admission: RE | Admit: 2016-02-08 | Discharge: 2016-02-08 | Disposition: A | Discharge status: Home / Self Care | Payer: Medicaid Other | Source: Ambulatory Visit | Attending: Family Medicine | Admitting: Family Medicine

## 2016-02-08 DIAGNOSIS — G8929 Other chronic pain: Secondary | ICD-10-CM

## 2016-02-08 DIAGNOSIS — R51 Headache: Secondary | ICD-10-CM | POA: Insufficient documentation

## 2016-02-08 DIAGNOSIS — M50222 Other cervical disc displacement at C5-C6 level: Secondary | ICD-10-CM | POA: Diagnosis not present

## 2016-02-08 DIAGNOSIS — R519 Headache, unspecified: Secondary | ICD-10-CM

## 2016-02-08 NOTE — Telephone Encounter (Signed)
On-call response  Called by [facility] radiology as current MRI neck order not correct. Per review of Dr. Tyson Alias notes determine the differential rule out includes mass vs CNS disease vs myelopathy due to nerve impingement, therefore radiology advised cervical MRI w/without contrast.

## 2016-02-10 ENCOUNTER — Encounter (HOSPITAL_COMMUNITY): Payer: Self-pay | Admitting: Emergency Medicine

## 2016-02-10 ENCOUNTER — Ambulatory Visit (HOSPITAL_COMMUNITY)
Admission: EM | Admit: 2016-02-10 | Discharge: 2016-02-10 | Disposition: A | Discharge status: Home / Self Care | Payer: Medicaid Other | Attending: Emergency Medicine | Admitting: Emergency Medicine

## 2016-02-10 DIAGNOSIS — R51 Headache: Secondary | ICD-10-CM

## 2016-02-10 DIAGNOSIS — R202 Paresthesia of skin: Secondary | ICD-10-CM

## 2016-02-10 DIAGNOSIS — H9313 Tinnitus, bilateral: Secondary | ICD-10-CM

## 2016-02-10 DIAGNOSIS — R519 Headache, unspecified: Secondary | ICD-10-CM

## 2016-02-10 MED ORDER — KETOROLAC TROMETHAMINE 60 MG/2ML IM SOLN
INTRAMUSCULAR | Status: AC
  Filled 2016-02-10: qty 2

## 2016-02-10 MED ORDER — PREDNISONE 10 MG PO TABS: ORAL_TABLET | ORAL | 0 refills | Status: DC

## 2016-02-10 MED ORDER — DEXAMETHASONE SODIUM PHOSPHATE 10 MG/ML IJ SOLN
INTRAMUSCULAR | Status: AC
  Filled 2016-02-10: qty 1

## 2016-02-10 MED ORDER — DEXAMETHASONE SODIUM PHOSPHATE 10 MG/ML IJ SOLN
10.0000 mg | Freq: Once | INTRAMUSCULAR | Status: AC
  Administered 2016-02-10: 10 mg via INTRAMUSCULAR

## 2016-02-10 MED ORDER — KETOROLAC TROMETHAMINE 60 MG/2ML IM SOLN
60.0000 mg | Freq: Once | INTRAMUSCULAR | Status: AC
  Administered 2016-02-10: 60 mg via INTRAMUSCULAR

## 2016-02-10 MED ORDER — GABAPENTIN 300 MG PO CAPS: 300.0000 mg | ORAL_CAPSULE | Freq: Every day | ORAL | 0 refills | Status: DC

## 2016-02-10 NOTE — Discharge Instructions (Signed)
Your MRI is normal. We gave you 2 shots today to try to help with your symptoms. Take the prednisone as prescribed, starting tomorrow. Take gabapentin 1 pill at bedtime to help with the numbness and tingling. Please follow-up with your primary care doctor in the next week for a recheck.

## 2016-02-10 NOTE — ED Triage Notes (Signed)
Patient has been seen and had a mri and is scheduled for an appointment with pcp tomorrow.

## 2016-02-10 NOTE — ED Provider Notes (Signed)
[facility]    CSN: [id number] Arrival date & time: 02/10/16  1531  First Provider Contact:  None       History   Chief Complaint Chief Complaint  Patient presents with  . Headache  . Otalgia    HPI Daniel Nelson is a 30 y.o. male.   He is a 30 year old man here for evaluation of head discomfort. A video phone interpreter was used for this encounter. This has been an ongoing problem for the last 4 months. He has seen his PCP who arranged for an outpatient MRI of the head and neck which she had done 2 days ago. A referral to neurology has been placed, but he has not been contacted about an appointment. He states the pain starts in his ears and then radiates through his face and head. It is described more as numbness, tingling, and burning rather than pain. He does have some intermittent blurred vision, but this is unchanged from previous. He denies any nausea or vomiting. He also reports paresthesias primarily in his legs. No weakness. At night, he will hear buzzing in the bilateral ears. This lasts for a few minutes and then resolves. His primary care doctor try putting him on Elavil to help with the paresthesias, but as far as I can tell he is not taking this medication. He states he had an allergic reaction to some medicine given to him recently, this may be the Elavil but he does not know the names of any of his medicines.  He is taking his medication for H. pylori.    History reviewed. No pertinent past medical history.  Patient Active Problem List   Diagnosis Date Noted  . Chronic headaches 01/24/2016  . Epigastric pain 01/24/2016  . Myalgia 11/22/2015  . Seasonal allergies 11/11/2015  . H. pylori infection 08/17/2015  . Refugee health examination 08/01/2015  . H/O eye surgery 08/01/2015    Past Surgical History:  Procedure Laterality Date  . APPENDECTOMY    . EYE SURGERY         Home Medications    Prior to Admission medications   Medication  Sig Start Date End Date Taking? Authorizing Provider  amoxicillin-clarithromycin-lansoprazole [facility]) combo pack Take by mouth 2 (two) times daily. Take each medicine twice daily x 14 days.  Dispense QS x 14 days 02/04/16   Tobey Grim, MD  Chlorphen-PE-Acetaminophen 4-10-325 MG TABS Take 1 tablet every 4-6 hours as needed for drainage and stuffiness. Patient not taking: Reported on 01/19/2016 01/10/16   Hayden Rasmussen, NP  fluticasone [facility]) 50 MCG/ACT nasal spray Place 2 sprays into both nostrils daily. 01/19/16   Garlon Hatchet, PA-C  gabapentin (NEURONTIN) 300 MG capsule Take 1 capsule (300 mg total) by mouth at bedtime. 02/10/16   Charm Rings, MD  hyoscyamine (LEVBID) 0.375 MG 12 hr tablet Take 1 tablet (0.375 mg total) by mouth 2 (two) times daily. 12/09/15   Linna Hoff, MD  ibuprofen (ADVIL,MOTRIN) 800 MG tablet Take 1 tablet (800 mg total) by mouth 3 (three) times daily. 01/19/16   Garlon Hatchet, PA-C  Multiple Vitamins-Minerals (MULTIVITAMIN WITH MINERALS) tablet Take 1 tablet by mouth daily. 01/29/16   Tiffany Neva Seat, PA-C  omeprazole (PRILOSEC) 40 MG capsule Take 1 capsule (40 mg total) by mouth daily. 01/24/16   Tobey Grim, MD  ondansetron (ZOFRAN) 4 MG tablet Take 1 tablet (4 mg total) by mouth every 6 (six) hours. 01/29/16   Marlon Pel, PA-C  predniSONE (DELTASONE) 10 MG tablet Take 6 tablets on day 1, 5 on day 2, 4 on day 3, 3 on day 4, 2 on day 5, and 1 on day 6.  Start 02/11/16. 02/10/16   Charm Rings, MD    Family History Family History  Problem Relation Age of Onset  . Colon cancer Neg Hx     Social History Social History  Substance Use Topics  . Smoking status: Current Some Day Smoker    Types: Cigarettes  . Smokeless tobacco: Never Used     Comment: tobacco info given 01/07/16  . Alcohol use No     Allergies   Dicyclomine   Review of Systems Review of Systems  Constitutional: Negative for fever.  HENT: Positive for ear pain. Negative for trouble  swallowing.   Respiratory: Negative for shortness of breath.   Cardiovascular: Negative for chest pain.  Neurological: Positive for numbness and headaches. Negative for dizziness, speech difficulty and weakness.     Physical Exam Triage Vital Signs ED Triage Vitals  Enc Vitals Group     BP      Pulse      Resp      Temp      Temp src      SpO2      Weight      Height      Head Circumference      Peak Flow      Pain Score      Pain Loc      Pain Edu?      Excl. in GC?    No data found.   Updated Vital Signs BP 110/70 (BP Location: Right Arm)   Pulse 79   Temp 99 F (37.2 C) (Oral)   Resp 16   SpO2 97%   Visual Acuity Right Eye Distance:   Left Eye Distance:   Bilateral Distance:    Right Eye Near:   Left Eye Near:    Bilateral Near:     Physical Exam  Constitutional: He is oriented to person, place, and time. He appears well-developed and well-nourished. No distress.  HENT:  Serous effusion of the left ear. Right ear normal.  Eyes: EOM are normal. Pupils are equal, round, and reactive to light.  Neck: Normal range of motion.  Cardiovascular: Normal rate, regular rhythm and normal heart sounds.   Pulmonary/Chest: Effort normal and breath sounds normal.  Lymphadenopathy:    He has no cervical adenopathy.  Neurological: He is alert and oriented to person, place, and time. No cranial nerve deficit. He exhibits normal muscle tone. Coordination normal.     UC Treatments / Results  Labs (all labs ordered are listed, but only abnormal results are displayed) Labs Reviewed - No data to display  EKG  EKG Interpretation None       Radiology No results found.  Procedures Procedures (including critical care time)  Medications Ordered in UC Medications  ketorolac (TORADOL) injection 60 mg (60 mg Intramuscular Given 02/10/16 1738)  dexamethasone (DECADRON) injection 10 mg (10 mg Intramuscular Given 02/10/16 1737)     Initial Impression / Assessment  and Plan / UC Course  I have reviewed the triage vital signs and the nursing notes.  Pertinent labs & imaging results that were available during my care of the patient were reviewed by me and considered in my medical decision making (see chart for details).  Clinical Course    I have reviewed the MRIs performed on 722  and they are normal. I did provide these results to the patient.  This is a very atypical presentation for migraine or tension-type headaches.  Normal MRI is very reassuring.  We will try giving IM Toradol and Decadron here. Prescription given for prednisone taper and gabapentin to try to help with paresthesias. Discussed importance of follow-up with his PCP and neurology when that referral goes through.  Final Clinical Impressions(s) / UC Diagnoses   Final diagnoses:  Chronic intractable headache, unspecified headache type  Tinnitus, bilateral  Paresthesias    New Prescriptions New Prescriptions   GABAPENTIN (NEURONTIN) 300 MG CAPSULE    Take 1 capsule (300 mg total) by mouth at bedtime.   PREDNISONE (DELTASONE) 10 MG TABLET    Take 6 tablets on day 1, 5 on day 2, 4 on day 3, 3 on day 4, 2 on day 5, and 1 on day 6.  Start 02/11/16.     Charm Rings, MD 02/10/16 [phone]

## 2016-02-11 ENCOUNTER — Telehealth: Payer: Self-pay | Admitting: *Deleted

## 2016-02-11 ENCOUNTER — Encounter: Payer: Self-pay | Admitting: Family Medicine

## 2016-02-11 ENCOUNTER — Other Ambulatory Visit: Payer: Self-pay | Admitting: Family Medicine

## 2016-02-11 ENCOUNTER — Ambulatory Visit (INDEPENDENT_AMBULATORY_CARE_PROVIDER_SITE_OTHER): Payer: Medicaid Other | Admitting: Family Medicine

## 2016-02-11 DIAGNOSIS — G8929 Other chronic pain: Secondary | ICD-10-CM

## 2016-02-11 DIAGNOSIS — J302 Other seasonal allergic rhinitis: Secondary | ICD-10-CM | POA: Diagnosis not present

## 2016-02-11 DIAGNOSIS — Z9889 Other specified postprocedural states: Secondary | ICD-10-CM | POA: Diagnosis not present

## 2016-02-11 DIAGNOSIS — R51 Headache: Secondary | ICD-10-CM

## 2016-02-11 MED ORDER — FLUTICASONE PROPIONATE 50 MCG/ACT NA SUSP: 2.0000 | Freq: Every day | NASAL | 0 refills | Status: DC

## 2016-02-11 NOTE — Progress Notes (Signed)
Subjective:   [employer] interpreter [phone] Ammar used for visit today.    Daniel Nelson is a 30 y.o. male who presents to [facility] today for FU for his MRI results:  1.  Headaches:  Describes this as ongoing.  Present daily.  With associated burning senstion in his neck and lack of concentration.  Pain can start frontal portion of head, but more commonly in neck.  Changing position doesn't change degree of headache.  States when headache is very bad he falls to the ground.  Otherwise he has no trouble walking.  Does have some blurry vision in his Right eye.  Chronic since prior surgery.  No fevers/chills.  No weight gain or loss.  See prior OV notes for full details.  States pain is 10/10  Since last visit:  He has been seen at [facility] yesterday also for his headaches.  Given results of normal MRI there.  Also provided prednisone taper and gabapentin and IM decadron plus Toradol injection. States these did not help at all.     The patient is very worried about his symptoms.  He states he has not heard anything from the neurologist's office.    ROS as above per HPI.  Reports some ear itching bilaterally.  Also occasional eye itching and nasal drainage.   The following portions of the patient's history were reviewed and updated as appropriate: allergies, current medications, past medical history, family and social history, and problem list. Patient is a nonsmoker.    PMH reviewed.  No past medical history on file. Past Surgical History:  Procedure Laterality Date  . APPENDECTOMY    . EYE SURGERY      Medications reviewed.    Objective:   Physical Exam BP 120/66 (BP Location: Right Arm, Patient Position: Sitting, Cuff Size: Normal)   Pulse 97   Temp 97.9 F (36.6 C) (Oral)   Ht 6\' 2"  (1.88 m)   Wt 153 lb 6.4 oz (69.6 kg)   BMI 19.70 kg/m  Gen:  Alert, cooperative patient who appears stated age in no acute distress.  Vital signs reviewed.  Sitting on end of bed.  Very  comfortable appearing, doesn't appear to be in pain.   HEENT: EOMI,  MMM.  PERRL.  Funddoscopy WNL Neck:  Nontender.  No stiffness.  Cardiac:  Regular rate and rhythm without murmur auscultated.  Good S1/S2. Pulm:  Clear to auscultation bilaterally with good air movement.  No wheezes or rales noted.   Neuro:  No focal deficits currently.  Sensation and vibratory sense intact throughout.  Romberg negative.  Ambulating easily.    No results found for this or any previous visit (from the past 72 hour(s)).

## 2016-02-11 NOTE — Patient Instructions (Addendum)
Take the Neurontin 1 pill at night.  This is for nerve pain and your headaches.  I  Use the nasal spray 2 sprays in each nostril once a day.  Let me know if this is helping.  There is also a pill that helps with the swelling behind your ears.    I will make sure the eye doctor and neurologist send letters.  It was good to see you today.

## 2016-02-11 NOTE — Telephone Encounter (Signed)
Contacted pt to inform him that the appointment on 02/28/16 is the one that he needs to come to not the one scheduled on 02/18/16.  Relayed this message via [employer] [place] [phone]. I am cancelling the 02/18/16 appointment and mailing out the reminder card for the correct appointment.  The pt had dropped it on the floor before leaving. Lamonte Sakai, Nicholi Ghuman D, [place]

## 2016-02-13 NOTE — Assessment & Plan Note (Signed)
Desires ophthalmology exam.  Referral place last visit.  Will check on status of this.

## 2016-02-13 NOTE — Assessment & Plan Note (Addendum)
Think this is playing into his symptoms.   He describes what sounds like seasonal allergies with his ear and eye itching and nasal drainage.  Present only for past 4 months.  States this exacerbates and sometimes triggers her headaches.   Plan to treat with flonase after discussion about this +/- cetirizine.  If limited relief will add cetirizine  Will also see if this results in improved headaches.

## 2016-02-13 NOTE — Assessment & Plan Note (Addendum)
Discussed normal MRI results with patient.  He was partially reassured by this.   Still wants to know reason he is having headaches.  He describes 10/10 pain today, but very comfortable appearing.  Tolerating lights well.   Will touch base about sending a letter through neurology to contact patient since he doesn't speak [place].

## 2016-02-18 ENCOUNTER — Ambulatory Visit (HOSPITAL_COMMUNITY)
Admission: EM | Admit: 2016-02-18 | Discharge: 2016-02-18 | Disposition: A | Discharge status: Home / Self Care | Payer: Medicaid Other | Attending: Family Medicine | Admitting: Family Medicine

## 2016-02-18 ENCOUNTER — Encounter (HOSPITAL_COMMUNITY): Payer: Self-pay | Admitting: Emergency Medicine

## 2016-02-18 ENCOUNTER — Ambulatory Visit: Payer: Medicaid Other | Admitting: Family Medicine

## 2016-02-18 DIAGNOSIS — H6993 Unspecified Eustachian tube disorder, bilateral: Secondary | ICD-10-CM

## 2016-02-18 DIAGNOSIS — G44209 Tension-type headache, unspecified, not intractable: Secondary | ICD-10-CM

## 2016-02-18 DIAGNOSIS — H9203 Otalgia, bilateral: Secondary | ICD-10-CM

## 2016-02-18 DIAGNOSIS — J302 Other seasonal allergic rhinitis: Secondary | ICD-10-CM

## 2016-02-18 DIAGNOSIS — J342 Deviated nasal septum: Secondary | ICD-10-CM

## 2016-02-18 DIAGNOSIS — H6983 Other specified disorders of Eustachian tube, bilateral: Secondary | ICD-10-CM | POA: Diagnosis not present

## 2016-02-18 MED ORDER — PREDNISONE 10 MG (21) PO TBPK: ORAL_TABLET | ORAL | 0 refills | Status: DC

## 2016-02-18 MED ORDER — FLUTICASONE PROPIONATE 50 MCG/ACT NA SUSP: 2.0000 | Freq: Every day | NASAL | 2 refills | Status: DC

## 2016-02-18 MED ORDER — BUTALBITAL-APAP-CAFFEINE 50-325-40 MG PO TABS: 1.0000 | ORAL_TABLET | Freq: Four times a day (QID) | ORAL | 0 refills | Status: DC | PRN

## 2016-02-18 NOTE — ED Triage Notes (Addendum)
Pt states he has been having bilateral ear pain for four months.  Pt states he has not seen another doctor for this issue.  Pain is also in his head, makes him feel like his head is "swelling".  Arabic interpreter used.

## 2016-02-18 NOTE — ED Provider Notes (Signed)
CSN: [id number]     Arrival date & time 02/18/16  1322 History   None    Chief Complaint  Patient presents with  . Otalgia    bilateral   (Consider location/radiation/quality/duration/timing/severity/associated sxs/prior Treatment) Patient is c/o bilateral ear pain and headaches.  He has been having difficulty with chronic "burning" headaches that start after he develops ear pain.  An interpreter is used.  He is having  Ear discomfort and sinus drainage and occasional vertigo sx's.  He cannot tolerate the gabapentin. He is seeing a pcp and is awaiting neurology referral.  He has hx of deviated septum and sinus drainage.  He develops sinus discomfort and bilateral ear pain. The ear pain is then exacerbated by a tension like headache.   The history is provided by the patient. The history is limited by a language barrier. A language interpreter was used.  Otalgia  Location:  Bilateral Behind ear:  No abnormality Quality:  Aching Severity:  Moderate Onset quality:  Sudden Duration:  4 months Timing:  Intermittent Progression:  Waxing and waning Chronicity:  Chronic Relieved by:  Nothing Worsened by:  Nothing Ineffective treatments:  OTC medications Associated symptoms: headaches and rhinorrhea     History reviewed. No pertinent past medical history. Past Surgical History:  Procedure Laterality Date  . APPENDECTOMY    . EYE SURGERY Right "as a young child"   Family History  Problem Relation Age of Onset  . Colon cancer Neg Hx    Social History  Substance Use Topics  . Smoking status: Current Some Day Smoker    Types: Cigarettes  . Smokeless tobacco: Never Used     Comment: tobacco info given 01/07/16  . Alcohol use No    Review of Systems  Constitutional: Negative.   HENT: Positive for ear pain and rhinorrhea.   Eyes: Negative.   Respiratory: Negative.   Cardiovascular: Negative.   Gastrointestinal: Negative.   Endocrine: Negative.   Musculoskeletal: Negative.    Skin: Negative.   Allergic/Immunologic: Negative.   Neurological: Positive for dizziness and headaches.  Hematological: Negative.   Psychiatric/Behavioral: Negative.     Allergies  Dicyclomine  Home Medications   Prior to Admission medications   Medication Sig Start Date End Date Taking? Authorizing Provider  amoxicillin-clarithromycin-lansoprazole [facility]) combo pack Take by mouth 2 (two) times daily. Take each medicine twice daily x 14 days.  Dispense QS x 14 days 02/04/16   Tobey Grim, MD  Chlorphen-PE-Acetaminophen 4-10-325 MG TABS Take 1 tablet every 4-6 hours as needed for drainage and stuffiness. Patient not taking: Reported on 01/19/2016 01/10/16   Hayden Rasmussen, NP  fluticasone [facility]) 50 MCG/ACT nasal spray USE 2 SPRAYS IN [facility] NOSTRIL DAILY 02/12/16   Tobey Grim, MD  gabapentin (NEURONTIN) 300 MG capsule Take 1 capsule (300 mg total) by mouth at bedtime. 02/10/16   Charm Rings, MD  hyoscyamine (LEVBID) 0.375 MG 12 hr tablet Take 1 tablet (0.375 mg total) by mouth 2 (two) times daily. 12/09/15   Linna Hoff, MD  ibuprofen (ADVIL,MOTRIN) 800 MG tablet Take 1 tablet (800 mg total) by mouth 3 (three) times daily. 01/19/16   Garlon Hatchet, PA-C  Multiple Vitamins-Minerals (MULTIVITAMIN WITH MINERALS) tablet Take 1 tablet by mouth daily. 01/29/16   Tiffany Neva Seat, PA-C  omeprazole (PRILOSEC) 40 MG capsule Take 1 capsule (40 mg total) by mouth daily. 01/24/16   Tobey Grim, MD  ondansetron (ZOFRAN) 4 MG tablet Take 1 tablet (4 mg total) by  mouth every 6 (six) hours. 01/29/16   Tiffany Neva Seat, PA-C  predniSONE (DELTASONE) 10 MG tablet Take 6 tablets on day 1, 5 on day 2, 4 on day 3, 3 on day 4, 2 on day 5, and 1 on day 6.  Start 02/11/16. 02/10/16   Charm Rings, MD   Meds Ordered and Administered this Visit  Medications - No data to display  BP 100/66 (BP Location: Left Arm)   Pulse 78   Temp 98.5 F (36.9 C) (Oral)   Resp 16   SpO2 100%  No data found.   Physical  Exam  Constitutional: He is oriented to person, place, and time. He appears well-developed and well-nourished.  HENT:  Head: Normocephalic and atraumatic.  Right Ear: External ear normal.  Left Ear: External ear normal.  Mouth/Throat: Oropharynx is clear and moist.  Deviated septum to right  Eyes: Conjunctivae and EOM are normal. Pupils are equal, round, and reactive to light.  Neck: Normal range of motion. Neck supple.  Cardiovascular: Normal rate, regular rhythm and normal heart sounds.   Pulmonary/Chest: Effort normal and breath sounds normal.  Neurological: He is alert and oriented to person, place, and time.    Urgent Care Course   Clinical Course    Procedures (including critical care time)  Labs Review Labs Reviewed - No data to display  Imaging Review No results found.   Visual Acuity Review  Right Eye Distance:   Left Eye Distance:   Bilateral Distance:    Right Eye Near:   Left Eye Near:    Bilateral Near:         MDM  Otalgia bilateral - Probably due to ETD recommend medrol dose pack and flonase NS  Headache - Sounds like chronic Tension type headache.  Try fioricet and medrol dose pack.  Follow up with Neurology.  Deviated Septum - May need to follow up with PCP for referral to ENT.    Deatra Canter, FNP 02/18/16 [phone]

## 2016-02-19 ENCOUNTER — Ambulatory Visit (AMBULATORY_SURGERY_CENTER): Payer: Medicaid Other | Admitting: Gastroenterology

## 2016-02-19 ENCOUNTER — Encounter: Payer: Self-pay | Admitting: Gastroenterology

## 2016-02-19 VITALS — BP 106/61 | HR 55 | Temp 99.3°F | Resp 19 | Ht 73.0 in | Wt 154.0 lb

## 2016-02-19 DIAGNOSIS — K2950 Unspecified chronic gastritis without bleeding: Secondary | ICD-10-CM | POA: Diagnosis not present

## 2016-02-19 DIAGNOSIS — R1010 Upper abdominal pain, unspecified: Secondary | ICD-10-CM | POA: Diagnosis not present

## 2016-02-19 MED ORDER — SODIUM CHLORIDE 0.9 % IV SOLN: 500.0000 mL | INTRAVENOUS | Status: DC

## 2016-02-19 NOTE — Progress Notes (Signed)
Called to room to assist during endoscopic procedure.  Patient ID and intended procedure confirmed with present staff. Received instructions for my participation in the procedure from the performing physician.  

## 2016-02-19 NOTE — Progress Notes (Signed)
Report to PACU, RN, vss, BBS= Clear.  

## 2016-02-19 NOTE — Op Note (Signed)
[facility] Patient Name: Daniel Nelson Procedure Date: 02/19/2016 9:27 AM MRN: [id number] Endoscopist: Rachael Fee , MD Age: 30 Referring MD:  Date of Birth: 10/16/1985 Gender: Male Account #: [id number] Procedure:                Upper GI endoscopy Indications:              Abdominal pain in the left upper quadrant Medicines:                Monitored Anesthesia Care Procedure:                Pre-Anesthesia Assessment:                           - Prior to the procedure, a History and Physical                            was performed, and patient medications and                            allergies were reviewed. The patient's tolerance of                            previous anesthesia was also reviewed. The risks                            and benefits of the procedure and the sedation                            options and risks were discussed with the patient.                            All questions were answered, and informed consent                            was obtained. Prior Anticoagulants: The patient has                            taken no previous anticoagulant or antiplatelet                            agents. ASA Grade Assessment: II - A patient with                            mild systemic disease. After reviewing the risks                            and benefits, the patient was deemed in                            satisfactory condition to undergo the procedure.                           After obtaining informed consent, the endoscope was  passed under direct vision. Throughout the                            procedure, the patient's blood pressure, pulse, and                            oxygen saturations were monitored continuously. The                            Model GIF-HQ190 [id number]) scope was introduced                            through the mouth, and advanced to the second part                            of  duodenum. The upper GI endoscopy was                            accomplished without difficulty. The patient                            tolerated the procedure well. Scope In: Scope Out: Findings:                 The esophagus was normal.                           Diffuse mild inflammation characterized by erythema                            and granularity was found in the entire examined                            stomach. Biopsies were taken with a cold forceps                            for histology.                           The examined duodenum was normal. Complications:            No immediate complications. Estimated blood loss:                            None. Estimated Blood Loss:     Estimated blood loss: none. Impression:               - Normal esophagus.                           - Gastritis. Biopsied.                           - Normal examined duodenum. Recommendation:           - Patient has a contact number available for  emergencies. The signs and symptoms of potential                            delayed complications were discussed with the                            patient. Return to normal activities tomorrow.                            Written discharge instructions were provided to the                            patient.                           - Resume previous diet.                           - Continue present medications.                           - Await pathology results. Rachael Fee, MD 02/19/2016 11:22:30 AM This report has been signed electronically.

## 2016-02-19 NOTE — Patient Instructions (Signed)
Biopsies taken today. Gastritis seen, handout given.  Call us with any questions or concerns. Thank you!  YOU HAD AN ENDOSCOPIC PROCEDURE TODAY AT THE [facility]:   Refer to the procedure report that was given to you for any specific questions about what was found during the examination.  If the procedure report does not answer your questions, please call your gastroenterologist to clarify.  If you requested that your care partner not be given the details of your procedure findings, then the procedure report has been included in a sealed envelope for you to review at your convenience later.  YOU SHOULD EXPECT: Some feelings of bloating in the abdomen. Passage of more gas than usual.  Walking can help get rid of the air that was put into your GI tract during the procedure and reduce the bloating. If you had a lower endoscopy (such as a colonoscopy or flexible sigmoidoscopy) you may notice spotting of blood in your stool or on the toilet paper. If you underwent a bowel prep for your procedure, you may not have a normal bowel movement for a few days.  Please Note:  You might notice some irritation and congestion in your nose or some drainage.  This is from the oxygen used during your procedure.  There is no need for concern and it should clear up in a day or so.  SYMPTOMS TO REPORT IMMEDIATELY:    Following upper endoscopy (EGD)  Vomiting of blood or coffee ground material  New chest pain or pain under the shoulder blades  Painful or persistently difficult swallowing  New shortness of breath  Fever of 100F or higher  Black, tarry-looking stools  For urgent or emergent issues, a gastroenterologist can be reached at any hour by calling [phone].   DIET: Your first meal following the procedure should be a small meal and then it is ok to progress to your normal diet. Heavy or fried foods are harder to digest and may make you feel nauseous or bloated.  Likewise, meals heavy in  dairy and vegetables can increase bloating.  Drink plenty of fluids but you should avoid alcoholic beverages for 24 hours.  ACTIVITY:  You should plan to take it easy for the rest of today and you should NOT DRIVE or use heavy machinery until tomorrow (because of the sedation medicines used during the test).    FOLLOW UP: Our staff will call the number listed on your records the next business day following your procedure to check on you and address any questions or concerns that you may have regarding the information given to you following your procedure. If we do not reach you, we will leave a message.  However, if you are feeling well and you are not experiencing any problems, there is no need to return our call.  We will assume that you have returned to your regular daily activities without incident.  If any biopsies were taken you will be contacted by phone or by letter within the next 1-3 weeks.  Please call us at [phone] if you have not heard about the biopsies in 3 weeks.    SIGNATURES/CONFIDENTIALITY: You and/or your care partner have signed paperwork which will be entered into your electronic medical record.  These signatures attest to the fact that that the information above on your After Visit Summary has been reviewed and is understood.  Full responsibility of the confidentiality of this discharge information lies with you and/or your care-partner. 

## 2016-02-20 ENCOUNTER — Telehealth: Payer: Self-pay

## 2016-02-20 NOTE — Telephone Encounter (Signed)
  Follow up Call-  Call back number 02/19/2016  Post procedure Call Back phone  # [phone]  Permission to leave phone message Yes    Patient was called for follow up after his procedure on 02/19/2016. No answer at the number given for follow up phone call. A message was left on the answering machine.

## 2016-02-28 ENCOUNTER — Encounter: Payer: Self-pay | Admitting: Family Medicine

## 2016-02-28 ENCOUNTER — Ambulatory Visit (INDEPENDENT_AMBULATORY_CARE_PROVIDER_SITE_OTHER): Payer: Medicaid Other | Admitting: Family Medicine

## 2016-02-28 DIAGNOSIS — H9203 Otalgia, bilateral: Secondary | ICD-10-CM

## 2016-02-28 DIAGNOSIS — A048 Other specified bacterial intestinal infections: Secondary | ICD-10-CM

## 2016-02-28 DIAGNOSIS — B9681 Helicobacter pylori [H. pylori] as the cause of diseases classified elsewhere: Secondary | ICD-10-CM

## 2016-02-28 DIAGNOSIS — R202 Paresthesia of skin: Secondary | ICD-10-CM | POA: Diagnosis not present

## 2016-02-28 MED ORDER — TRIAMCINOLONE ACETONIDE 0.1 % EX CREA: 1.0000 "application " | TOPICAL_CREAM | Freq: Two times a day (BID) | CUTANEOUS | 0 refills | Status: DC

## 2016-02-28 NOTE — Assessment & Plan Note (Signed)
Pain initially improved. However now still is some mild reflux symptoms after big meals. We'll send in for omeprazole.

## 2016-02-28 NOTE — Progress Notes (Signed)
Subjective:    Daniel Nelson is a 30 y.o. male who [facility] today for ear pain and paresthesias:  1.  Ear pain:Persist. He was actually seen in the urgent care within the past several days. States that the fluid is years persists. He has been using his Flonase with some variable relief.  New information today is that he had some form of septal deviation surgery 2-1/2 years ago while in [place]. He evidently has had ongoing issues with sinuses. He is asking for referral to ENT today to evaluate for his ongoing ear pain and pressure. No decrease in hearing. Does have issues with balance as noted below.  2.  Paresthesias:  Also with occasional balance issues. He describes these as difficulty ambulating were feels like the floors: Follow-up monitoring. He has had fairly extensive evaluation for this in the past. He has had negative MRI. Seems fairly inconsistent but does state this happens at least once a week. Is been referred to neurology. He has follow-up with neurology on August 22. He denies any numbness or back pain currently.   ROS as above per HPI.   The following portions of the patient's history were reviewed and updated as appropriate: allergies, current medications, past medical history, family and social history, and problem list. Patient is a nonsmoker.    PMH reviewed.  No past medical history on file. Past Surgical History:  Procedure Laterality Date  . APPENDECTOMY    . EYE SURGERY Right "as a young child"    Medications reviewed.    Objective:   Physical Exam BP 104/64   Pulse (!) 56   Temp 97.8 F (36.6 C) (Oral)   Ht 6\' 1"  (1.854 m)   Wt 155 lb (70.3 kg)   BMI 20.45 kg/m  Gen:  Alert, cooperative patient who appears stated age in no acute distress.  Vital signs reviewed. HEENT: EOMI,  MMM. Fluid still persists bilateral ears. I'm unable to appreciate any surgical scars/changes to nasal septum. Cardiac:  Regular rate and rhythm  Pulm:  Clear to  auscultation bilaterally with good air movement.  No wheezes or rales noted.   Neuro:  No focal deficits grossly.  Sensation and vibratory sensation intact.  Romberg negative.  Balance good here in clinic.   No results found for this or any previous visit (from the past 72 hour(s)).

## 2016-02-28 NOTE — Assessment & Plan Note (Signed)
Initially with some relief from Flonase. However still with ongoing pressure in his ears. Also as noted above evidently had nasal septum deviation correction in [place]. Desires referral to ENT to see if he can have surgical correction of pain/pressure behind his ears.

## 2016-02-28 NOTE — Assessment & Plan Note (Signed)
Persist.   Inconsistent history. Sometimes and sciatic nerve distribution and sometimes more stocking glove distribution bilateral legs. Also has some ongoing difficulties with balance. None today. Could be secondary to fluid behind his ears. This plus his ongoing headaches are reason for referral to neurology for further evaluation of both headaches and questionable vertiginous symptoms.

## 2016-03-03 ENCOUNTER — Telehealth: Payer: Self-pay | Admitting: Family Medicine

## 2016-03-03 NOTE — Telephone Encounter (Signed)
[facility] is requesting the office notes from the patients last visit. Please fax them to [phone] ASAP since the patient is there now for a visit. jw

## 2016-03-04 NOTE — Telephone Encounter (Signed)
Last 2 office notes sent to [facility]. Lamonte SakaiZimmerman Rumple, Slate Debroux D, [place]

## 2016-03-10 ENCOUNTER — Encounter: Payer: Self-pay | Admitting: Neurology

## 2016-03-10 ENCOUNTER — Other Ambulatory Visit: Payer: Self-pay | Admitting: Neurology

## 2016-03-10 ENCOUNTER — Ambulatory Visit (INDEPENDENT_AMBULATORY_CARE_PROVIDER_SITE_OTHER): Payer: Medicaid Other | Admitting: Neurology

## 2016-03-10 VITALS — BP 105/70 | HR 57 | Ht 73.0 in | Wt 155.2 lb

## 2016-03-10 DIAGNOSIS — R531 Weakness: Secondary | ICD-10-CM | POA: Diagnosis not present

## 2016-03-10 DIAGNOSIS — R202 Paresthesia of skin: Secondary | ICD-10-CM

## 2016-03-10 MED ORDER — DULOXETINE HCL 60 MG PO CPEP: 60.0000 mg | ORAL_CAPSULE | Freq: Every day | ORAL | 12 refills | Status: DC

## 2016-03-10 NOTE — Progress Notes (Signed)
PATIENT: Daniel Nelson DOB: [birth year]  Chief Complaint  Patient presents with  . Numbness    He is here with an interpreter from [facility] today.  Reports numbness/tingling on the left side of his face, both feet and both hands.  Symptoms have been present for the last five months.  He was given Prednisone but it was not helpful.     HISTORICAL  Daniel Nelson is a 31 years old right-handed male, native of [place], he is with his interpreter at today's clinical visit, seen in refer by his primary care Dr. Gildardo Cranker for evaluation of numbness tingling on the left side of his body on March 10 2016.  He came to [place] from [place] in August 2016, he used to work at [employer] care at [employer], he stopped working since March 2017 because constellation of complaints, including dizziness, trouble walking,  He complains of pain at left side of his face and neck, radiating to both arm and legs,  he complains of intermittent trouble walking, blurry vision, feeling tired, left eye is worse, intermittent episode of numbness throughout his body, especially at left fourth and fifth fingers,   Recently he also complains of loss of appetite, poor sleep, frequent headaches, he has children at age 44 and 70, his wife is currently pregnant.   I reviewed laboratory evaluation since 2017, normal CBC, CMP, CPK, TSH, lipase level, C-reactive protein, ESR negative HIV  I personally reviewed CAT scan of the brain July 2017 that was normal, MRA of the brain was normal, MRA of the cervical spine showed mild degenerative disc disease there was no evidence of cord compression. ,  REVIEW OF SYSTEMS: Full 14 system review of systems performed and notable only for as above  ALLERGIES: Allergies  Allergen Reactions  . Dicyclomine Anxiety    Potential anxiety and GI distress?    HOME MEDICATIONS: No current outpatient prescriptions on file.   No current facility-administered medications for this  visit.     PAST MEDICAL HISTORY: Past Medical History:  Diagnosis Date  . Paresthesia     PAST SURGICAL HISTORY: Past Surgical History:  Procedure Laterality Date  . APPENDECTOMY    . EYE SURGERY Right "as a young child"    FAMILY HISTORY: Family History  Problem Relation Age of Onset  . Rheum arthritis Mother   . Healthy Father   . Colon cancer Neg Hx     SOCIAL HISTORY:  Social History   Social History  . Marital status: Married    Spouse name: N/A  . Number of children: 2  . Years of education: N/A   Occupational History  . lawn care at [employer] History Main Topics  . Smoking status: Current Some Day Smoker    Types: Cigarettes  . Smokeless tobacco: Never Used     Comment: tobacco info given 01/07/16  . Alcohol use No  . Drug use: No  . Sexual activity: Not on file   Other Topics Concern  . Not on file   Social History Narrative   [place] refugee -- arrived in the [place] on March 06, 2015.   Lives at home with wife and children.   Right-handed.   Occasional caffeine use.     PHYSICAL EXAM   Vitals:   03/10/16 1153  BP: 105/70  Pulse: (!) 57  Weight: 155 lb 4 oz (70.4 kg)  Height: '6\' 1"'$  (1.854 m)    Not  recorded      Body mass index is 20.48 kg/m.  PHYSICAL EXAMNIATION:  Gen: NAD, conversant, well nourised, obese, well groomed                     Cardiovascular: Regular rate rhythm, no peripheral edema, warm, nontender. Eyes: Conjunctivae clear without exudates or hemorrhage Neck: Supple, no carotid bruise. Pulmonary: Clear to auscultation bilaterally   NEUROLOGICAL EXAM:  MENTAL STATUS: Speech:    Speech is normal; fluent and spontaneous with normal comprehension.  Cognition:     Orientation to time, place and person     Normal recent and remote memory     Normal Attention span and concentration     Normal Language, naming, repeating,spontaneous speech     Fund of knowledge   CRANIAL NERVES: CN II:  Visual fields are full to confrontation. Fundoscopic exam is normal with sharp discs and no vascular changes. Pupils are round equal and briskly reactive to light. CN III, IV, VI: extraocular movement are normal. No ptosis. CN V: Facial sensation is intact to pinprick in all 3 divisions bilaterally. Corneal responses are intact.  CN VII: Face is symmetric with normal eye closure and smile. CN VIII: Hearing is normal to rubbing fingers CN IX, X: Palate elevates symmetrically. Phonation is normal. CN XI: Head turning and shoulder shrug are intact CN XII: Tongue is midline with normal movements and no atrophy.  MOTOR: There is no pronator drift of out-stretched arms. Muscle bulk and tone are normal. Muscle strength is normal.  REFLEXES: Reflexes are 2+ and symmetric at the biceps, triceps, knees, and ankles. Plantar responses are flexor.  SENSORY: Intact to light touch, pinprick, positional sensation and vibratory sensation are intact in fingers and toes.  COORDINATION: Rapid alternating movements and fine finger movements are intact. There is no dysmetria on finger-to-nose and heel-knee-shin.    GAIT/STANCE: Posture is normal. Gait is steady with normal steps, base, arm swing, and turning. Heel and toe walking are normal. Tandem gait is normal.  Romberg is absent.   DIAGNOSTIC DATA (LABS, IMAGING, TESTING) - I reviewed patient records, labs, notes, testing and imaging myself where available.   ASSESSMENT AND PLAN  Daniel Nelson is a 30 y.o. male   With constellation of complaints,  There was no focal symptoms and focal signs on examination, essentially normal neurological examination,  Extensive laboratory evaluation imaging studies reviewed with patient,  There is is no neurological disease to account for his constellation of complaints,  I have suggested Cymbalta 60 mg every day for symptomatic control, continue follow-up with his primary care physician,    Marcial Pacas,  M.D. Ph.D.  [facility] [address] [place], [place] [address] Ph: [phone] Fax: [phone]  DE:YCXKGYJ H Walden,

## 2016-03-24 ENCOUNTER — Telehealth: Payer: Self-pay | Admitting: Family Medicine

## 2016-03-24 DIAGNOSIS — J342 Deviated nasal septum: Secondary | ICD-10-CM

## 2016-03-24 NOTE — Telephone Encounter (Signed)
Patient came into office wanted to know if an appt. Has been made for an ENT. Please let patient know when this has been done.[phone]

## 2016-03-24 NOTE — Telephone Encounter (Signed)
Please call Arline AspCindy, friend of patient about this matter [phone]

## 2016-03-26 NOTE — Telephone Encounter (Signed)
No referral for ENT is showing. Please advise. Please call [phone] Cindy Kunl to relay the message once the referral has been done.

## 2016-03-26 NOTE — Telephone Encounter (Signed)
This was a referral to ENT.  I assume the patient is asking if he has been referred. I put the referral in at his last visit (I think).   I'm not sure if anything happened with this.  Can you check and see if the referral has gone through?

## 2016-03-27 DIAGNOSIS — J342 Deviated nasal septum: Secondary | ICD-10-CM | POA: Insufficient documentation

## 2016-03-27 NOTE — Telephone Encounter (Signed)
I have put in this referral.  Thanks!

## 2016-03-27 NOTE — Telephone Encounter (Signed)
[place]oke to [place].  Informed her that referral has been placed and an appt should be made next week. Sunday SpillersSharon T Saunders, CMA

## 2016-04-17 ENCOUNTER — Ambulatory Visit (INDEPENDENT_AMBULATORY_CARE_PROVIDER_SITE_OTHER): Payer: Medicaid Other | Admitting: Obstetrics and Gynecology

## 2016-04-17 ENCOUNTER — Encounter: Payer: Self-pay | Admitting: Obstetrics and Gynecology

## 2016-04-17 VITALS — BP 106/75 | HR 65 | Temp 99.1°F | Wt 154.0 lb

## 2016-04-17 DIAGNOSIS — R51 Headache: Secondary | ICD-10-CM

## 2016-04-17 DIAGNOSIS — M791 Myalgia, unspecified site: Secondary | ICD-10-CM

## 2016-04-17 DIAGNOSIS — R519 Headache, unspecified: Secondary | ICD-10-CM

## 2016-04-17 DIAGNOSIS — R202 Paresthesia of skin: Secondary | ICD-10-CM

## 2016-04-17 LAB — CBC WITH DIFFERENTIAL/PLATELET
BASOS ABS: 0 {cells}/uL (ref 0–200)
BASOS PCT: 0 %
EOS ABS: 69 {cells}/uL (ref 15–500)
Eosinophils Relative: 1 %
HEMATOCRIT: 43.7 % (ref 38.5–50.0)
Hemoglobin: 15 g/dL (ref 13.2–17.1)
LYMPHS PCT: 42 %
Lymphs Abs: 2898 cells/uL (ref 850–3900)
MCH: 31.6 pg (ref 27.0–33.0)
MCHC: 34.3 g/dL (ref 32.0–36.0)
MCV: 92 fL (ref 80.0–100.0)
MONO ABS: 345 {cells}/uL (ref 200–950)
MONOS PCT: 5 %
MPV: 11.8 fL (ref 7.5–12.5)
NEUTROS PCT: 52 %
Neutro Abs: 3588 cells/uL (ref 1500–7800)
PLATELETS: 162 10*3/uL (ref 140–400)
RBC: 4.75 MIL/uL (ref 4.20–5.80)
RDW: 13.1 % (ref 11.0–15.0)
WBC: 6.9 10*3/uL (ref 3.8–10.8)

## 2016-04-17 LAB — COMPREHENSIVE METABOLIC PANEL
ALK PHOS: 35 U/L — AB (ref 40–115)
ALT: 9 U/L (ref 9–46)
AST: 11 U/L (ref 10–40)
Albumin: 4.8 g/dL (ref 3.6–5.1)
BUN: 12 mg/dL (ref 7–25)
CALCIUM: 9.4 mg/dL (ref 8.6–10.3)
CHLORIDE: 103 mmol/L (ref 98–110)
CO2: 26 mmol/L (ref 20–31)
Creat: 0.78 mg/dL (ref 0.60–1.35)
GLUCOSE: 79 mg/dL (ref 65–99)
POTASSIUM: 4.2 mmol/L (ref 3.5–5.3)
Sodium: 140 mmol/L (ref 135–146)
Total Bilirubin: 0.5 mg/dL (ref 0.2–1.2)
Total Protein: 7.4 g/dL (ref 6.1–8.1)

## 2016-04-17 LAB — VITAMIN B12: Vitamin B-12: 502 pg/mL (ref 200–1100)

## 2016-04-17 NOTE — Patient Instructions (Signed)
I will call with your blood work results. I would like you to make an appointment with Rheumatology. A referal has been placed you can expect a phone call this week or next to help you schedule.

## 2016-04-17 NOTE — Progress Notes (Signed)
[place] interpreter [phone] used for this encounter  Patient is a 30 year old male recently emigrated from [place] who presents with chronic fatigue headache dizziness and myalgias. He has been evaluated many times for these symptoms. It seems he was first evaluated in May 2017. He reports there is no change in his symptoms since initial evaluation. She was seen by a neurologist approximately 4 weeks ago his note is reviewed and shows that he was started on Cymbalta. When questioned the patient states that this medicine made him feel very anxious after one dose and he took no more doses. He also was concerned that this is a psychiatric medicine. He seemed very dissatisfied with his encounter with a neurologist. Patient is concerned about a history of B12 deficiency and that this may be contributing to his symptoms.    Review of Systems  Constitutional: Negative for chills and fever.  HENT: Negative for congestion, sore throat and tinnitus.   Eyes: Negative for blurred vision and double vision.  Respiratory: Negative for cough, hemoptysis, sputum production and shortness of breath.   Cardiovascular: Negative for chest pain and palpitations.  Gastrointestinal: Negative for abdominal pain, heartburn, nausea and vomiting.  Genitourinary: Negative for dysuria, frequency and urgency.  Musculoskeletal: Positive for joint pain and myalgias. Negative for back pain and neck pain.  Skin: Negative for itching and rash.  Neurological: Positive for dizziness, tingling and headaches.   Physical Exam  Constitutional: He is oriented to person, place, and time and well-developed, well-nourished, and in no distress.  HENT:  Head: Normocephalic and atraumatic.  Cardiovascular: Normal rate, regular rhythm and normal heart sounds.   Pulmonary/Chest: Effort normal and breath sounds normal. No respiratory distress. He has no wheezes.  Abdominal: Soft. Bowel sounds are normal. He exhibits no distension. There is no  tenderness. There is no rebound.  Musculoskeletal: Normal range of motion. He exhibits no edema, tenderness or deformity.  Neurological: He is alert and oriented to person, place, and time. He displays normal reflexes. No cranial nerve deficit. Coordination normal.  Patient with sensation to cold and sharp at the bilateral feet and hands as well as further up both extremities.  Skin: Skin is warm and dry.  Vitals reviewed.   Assessment and plan: Etiology of patient's symptoms remain unknown., Believe this to be somatic in nature or related to anxiety/depression however patient is not on board with this diagnosis. Per notes from May will order an ANA as well as of B12 and vitamin D level. Additionally will order a CMP and CBC as well as a hemoglobin A1c as patient has some elevated sugars on previous blood work. Patient will follow-up with primary care doctor. Additionally patient is referred to rheumatology.

## 2016-04-18 LAB — VITAMIN D 25 HYDROXY (VIT D DEFICIENCY, FRACTURES): Vit D, 25-Hydroxy: 28 ng/mL — ABNORMAL LOW (ref 30–100)

## 2016-04-20 LAB — ANA,IFA RA DIAG PNL W/RFLX TIT/PATN
ANA: NEGATIVE
CYCLIC CITRULLIN PEPTIDE AB: 24 U — AB

## 2016-04-21 ENCOUNTER — Telehealth: Payer: Self-pay | Admitting: *Deleted

## 2016-04-21 NOTE — Telephone Encounter (Signed)
[facility] called requesting most recent lab work.  Patient has an appointment with Dr. Burna FortsZ's office 06/22/16, Monday; patient to arrive at 3:45 PM. Patient will need to bring all medications, insurance card and co-pay.  Most recent labs faxed to Dr. Burna FortsZ's office for review.    Left voice message for patient's friend Arline AspCindy, that appointment is 06/22/16.    Clovis PuMartin, Shaday Rayborn L, RN

## 2016-04-22 ENCOUNTER — Telehealth: Payer: Self-pay

## 2016-04-22 ENCOUNTER — Telehealth: Payer: Self-pay | Admitting: Obstetrics and Gynecology

## 2016-04-22 MED ORDER — ERGOCALCIFEROL 50 MCG (2000 UT) PO TABS: 1.0000 | ORAL_TABLET | Freq: Every day | ORAL | 3 refills | Status: DC

## 2016-04-22 NOTE — Telephone Encounter (Signed)
Informed pt of low vitamin D. Prescription sent to pharmacy. Also infomred a abnormal anti-ccp lab. Encouraged to follow up with rheumatology. Pt reports he did not know the appointment time or place. Will contact the office to have them send a letter to the pt.

## 2016-04-22 NOTE — Telephone Encounter (Signed)
-----   Message from Lorne SkeensNicholas Michael Schenk, MD sent at 04/22/2016 11:20 AM EDT ----- Can you please send a copy of this pts results to him with a letter when his rheum appointment is. I spoke to him with the translator and told him but he asked for a paper copy. Thanks.

## 2016-04-22 NOTE — Telephone Encounter (Signed)
Mailed letter to pt with results

## 2016-04-28 ENCOUNTER — Ambulatory Visit (INDEPENDENT_AMBULATORY_CARE_PROVIDER_SITE_OTHER): Payer: Medicaid Other | Admitting: Family Medicine

## 2016-04-28 ENCOUNTER — Encounter: Payer: Self-pay | Admitting: Family Medicine

## 2016-04-28 VITALS — BP 113/74 | HR 69 | Temp 98.5°F | Ht 73.0 in | Wt 153.2 lb

## 2016-04-28 DIAGNOSIS — G500 Trigeminal neuralgia: Secondary | ICD-10-CM | POA: Diagnosis present

## 2016-04-28 MED ORDER — CARBAMAZEPINE ER 100 MG PO TB12: 100.0000 mg | ORAL_TABLET | Freq: Two times a day (BID) | ORAL | 0 refills | Status: DC

## 2016-04-28 NOTE — Progress Notes (Signed)
   Daniel Nelson is a 30 y.o. male who presents to [facility] today for Left ear pain:  1.  Leftt ear pain:  Persist. Describes pain that is present most days. He describes this as throbbing pain and occasionally sharp stabbing pain. Radiates to his jaw. Occasionally radiates to his teeth. Also sometimes radiates to his left eye. Contributes to headache. No changes in vision. No changes in hearing.  No fevers or chills. Does have occasional vertigo symptoms when this occurs. Worse at night. His wife is present and corroborates this. No trauma to his ear. He has been evaluated by neurologist but found no objective cause of his ongoing paresthesias.  ROS as above.   PMH reviewed. Patient is a nonsmoker.   Past Medical History:  Diagnosis Date  . Paresthesia    Past Surgical History:  Procedure Laterality Date  . APPENDECTOMY    . EYE SURGERY Right "as a young child"    Medications reviewed. Current Outpatient Prescriptions  Medication Sig Dispense Refill  . Ergocalciferol 2000 units TABS Take 1 tablet by mouth daily. 30 tablet 3   No current facility-administered medications for this visit.      Physical Exam:  BP 113/74   Pulse 69   Temp 98.5 F (36.9 C) (Oral)   Ht 6\' 1"  (1.854 m)   Wt 153 lb 3.2 oz (69.5 kg)   BMI 20.21 kg/m  Gen:  Patient sitting on exam table, appears stated age in no acute distress Head: Normocephalic atraumatic Eyes: EOMI, PERRL, sclera and conjunctiva non-erythematous Ears:  Canals clear bilaterally.  TMs pearly gray bilaterally without erythema or bulging.   Mouth: Mucosa membranes moist. Tonsils +2, nonenlarged, non-erythematous. Neck: No cervical lymphadenopathy noted Heart:  RRR, no murmurs auscultated. Pulm:  Clear to auscultation bilaterally with good air movement.  No wheezes or rales noted.  Skin: No vesicles or rash or lesions noted throughout.   Assessment and Plan:  1.  Trigeminal neuralgia:   -  possibility, though his descriptions do not necessarily fit that of trigeminal neuralgia. -Describes this as more throbbing pain. However it does correspond to V1 and V3 nerve distribution. -Wanted to try treatment with Tegretol. However I discussed this was previously used as an older medicine for epilepsy and the patient became very nervous. -We'll therefore try duloxetine instead see if this provides some neuropathic pain relief.

## 2016-04-28 NOTE — Patient Instructions (Addendum)
  Come back to see me in 2 weeks to see how you're doing.  We may need to increase the dose at that point.    It is called Trigeminal neuralgia.

## 2016-04-28 NOTE — Telephone Encounter (Signed)
Pt was in office today and I gave him an appointment card with his appointment to the rheumatologist. Daniel Nelson, April D, CMA

## 2016-04-29 ENCOUNTER — Other Ambulatory Visit: Payer: Self-pay | Admitting: Family Medicine

## 2016-04-29 MED ORDER — DULOXETINE HCL 30 MG PO CPEP: 30.0000 mg | ORAL_CAPSULE | Freq: Every day | ORAL | 1 refills | Status: DC

## 2016-05-04 ENCOUNTER — Ambulatory Visit (HOSPITAL_COMMUNITY)
Admission: EM | Admit: 2016-05-04 | Discharge: 2016-05-04 | Disposition: A | Discharge status: Home / Self Care | Payer: Medicaid Other | Attending: Emergency Medicine | Admitting: Emergency Medicine

## 2016-05-04 ENCOUNTER — Encounter (HOSPITAL_COMMUNITY): Payer: Self-pay | Admitting: *Deleted

## 2016-05-04 DIAGNOSIS — M26622 Arthralgia of left temporomandibular joint: Secondary | ICD-10-CM | POA: Diagnosis not present

## 2016-05-04 DIAGNOSIS — H9202 Otalgia, left ear: Secondary | ICD-10-CM

## 2016-05-04 MED ORDER — PREDNISONE 10 MG (21) PO TBPK: ORAL_TABLET | ORAL | 0 refills | Status: DC

## 2016-05-04 MED ORDER — NAPROXEN 500 MG PO TABS: 500.0000 mg | ORAL_TABLET | Freq: Two times a day (BID) | ORAL | 0 refills | Status: DC

## 2016-05-04 MED ORDER — CYCLOBENZAPRINE HCL 10 MG PO TABS: 10.0000 mg | ORAL_TABLET | Freq: Every day | ORAL | 0 refills | Status: DC

## 2016-05-04 NOTE — Discharge Instructions (Signed)
Follow-up with Dr. Gwendolyn GrantWalden as scheduled. Continue taking the medicine that he prescribes CDU until you see him. Try these medicines as well. You may need to follow-up with a dentist as well. Grinding your teeth can make your ear pain worse.

## 2016-05-04 NOTE — ED Provider Notes (Signed)
HPI  SUBJECTIVE:  Daniel Nelson is a 30 y.o. male who presents with left ear pain for the medicine presents for the past 7 months. States it is constant, throbbing. He reports occasionally yellowish watery otorrhea and episodes of dizziness. He reports jaw pain, pain in the V1 distribution over his left forehead. Symptoms are worse with noise, chewing, or opening his mouth. No alleviating factors. He has been taking fluocinolone oil eardrops, Tegretol XL which was prescribed on 10/10, vitamin B-12. States that this is not helping. He denies any facial rash, fevers. He is not taking any Tylenol or ibuprofen.  Per chart review, the patient has been seen by multiple providers in the past for this, most recently by his PMD, Dr. Gwendolyn Grant. Per chart review, he was started on duloxetine, but patient brings in a bottle of Tegretol 100 mg XL twice a day which he states that he is taking. States that he is not taking any other medications for his symptoms.   All history obtained through translator.  Past Medical History:  Diagnosis Date  . Paresthesia     Past Surgical History:  Procedure Laterality Date  . APPENDECTOMY    . EYE SURGERY Right "as a young child"    Family History  Problem Relation Age of Onset  . Rheum arthritis Mother   . Healthy Father   . Colon cancer Neg Hx     Social History  Substance Use Topics  . Smoking status: Current Some Day Smoker    Types: Cigarettes  . Smokeless tobacco: Never Used     Comment: tobacco info given 01/07/16  . Alcohol use No    No current facility-administered medications for this encounter.   Current Outpatient Prescriptions:  .  cyclobenzaprine (FLEXERIL) 10 MG tablet, Take 1 tablet (10 mg total) by mouth at bedtime., Disp: 20 tablet, Rfl: 0 .  DULoxetine (CYMBALTA) 30 MG capsule, TAKE ONE CAPSULE BY MOUTH DAILY, Disp: 90 capsule, Rfl: 1 .  Ergocalciferol 2000 units TABS, Take 1 tablet by mouth daily., Disp: 30 tablet, Rfl: 3 .   naproxen (NAPROSYN) 500 MG tablet, Take 1 tablet (500 mg total) by mouth 2 (two) times daily., Disp: 20 tablet, Rfl: 0 .  predniSONE (STERAPRED UNI-PAK 21 TAB) 10 MG (21) TBPK tablet, Dispense one 6 day pack. Take as directed with food., Disp: 21 tablet, Rfl: 0  Allergies  Allergen Reactions  . Dicyclomine Anxiety    Potential anxiety and GI distress?     ROS  As noted in HPI.   Physical Exam  BP 104/60 (BP Location: Left Arm)   Pulse 60   Temp 98 F (36.7 C)   Resp 14   SpO2 100%   Constitutional: Well developed, well nourished, no acute distress Eyes:  EOMI, conjunctiva normal bilaterally HENT: Normocephalic, atraumatic,mucus membranes moist. TMs normal bilaterally. No pain with traction on pinna of left ear. External ear canal within normal limits. Positive tenderness L anterior canal at the TMJ joint. Positive crepitus with opening and closing his mouth.  Respiratory: Normal inspiratory effort Cardiovascular: Normal rate GI: nondistended skin: No facial rash, skin intact Musculoskeletal: no deformities Neurologic: Alert & oriented x 3, no focal neuro deficits Psychiatric: Speech and behavior appropriate   ED Course   Medications - No data to display  No orders of the defined types were placed in this encounter.   No results found for this or any previous visit (from the past 24 hour(s)). No results found.  ED Clinical  Impression  Otalgia of left ear  Arthralgia of left temporomandibular joint   ED Assessment/Plan  Patient may certainly have trigeminal neuralgia given the distribution of his pain which is in the V1 distribution, however, on my exam today, he has tenderness and crepitus over the TMJ joint. We'll have him continue the Tegretol until he sees Dr. Gwendolyn GrantWalden in 8 days.  I assume that he is not taking the duloxetine. We'll also try Flexeril, steroid dose taper and ibuprofen 800 mg 3 times a day. He will need to follow-up with a dentist as well. Will  send note to Dr. Gwendolyn GrantWalden re today's visit.    Discussed  MDM, plan and followup with patient and friends. Patient agrees with plan.   Meds ordered this encounter  Medications  . cyclobenzaprine (FLEXERIL) 10 MG tablet    Sig: Take 1 tablet (10 mg total) by mouth at bedtime.    Dispense:  20 tablet    Refill:  0  . naproxen (NAPROSYN) 500 MG tablet    Sig: Take 1 tablet (500 mg total) by mouth 2 (two) times daily.    Dispense:  20 tablet    Refill:  0  . predniSONE (STERAPRED UNI-PAK 21 TAB) 10 MG (21) TBPK tablet    Sig: Dispense one 6 day pack. Take as directed with food.    Dispense:  21 tablet    Refill:  0    *This clinic note was created using Scientist, clinical (histocompatibility and immunogenetics)Dragon dictation software. Therefore, there may be occasional mistakes despite careful proofreading.  ?   Domenick GongAshley Jariya Reichow, MD 05/04/16 2234

## 2016-05-04 NOTE — ED Notes (Signed)
No answer in lobby.

## 2016-05-04 NOTE — ED Triage Notes (Signed)
Pt   Reports  l  Earache    X  Over  7  Months     He has  Numerous   Visits   For   simlar  Symptoms  He  Reports   Being  Dizzy        He  Is taking  meds  For  Same  And  He  States  The  meds are  Not  Working   He is    Awake  And  Alert  And  Oriented

## 2016-05-12 ENCOUNTER — Encounter: Payer: Self-pay | Admitting: Family Medicine

## 2016-05-12 ENCOUNTER — Ambulatory Visit (INDEPENDENT_AMBULATORY_CARE_PROVIDER_SITE_OTHER): Payer: Medicaid Other | Admitting: Family Medicine

## 2016-05-12 VITALS — BP 111/76 | HR 90 | Temp 98.3°F | Ht 73.0 in | Wt 153.4 lb

## 2016-05-12 DIAGNOSIS — S0300XD Dislocation of jaw, unspecified side, subsequent encounter: Secondary | ICD-10-CM | POA: Diagnosis not present

## 2016-05-12 DIAGNOSIS — H9202 Otalgia, left ear: Secondary | ICD-10-CM | POA: Diagnosis not present

## 2016-05-12 MED ORDER — TRAMADOL HCL 50 MG PO TABS: 50.0000 mg | ORAL_TABLET | Freq: Three times a day (TID) | ORAL | 1 refills | Status: DC | PRN

## 2016-05-12 NOTE — Patient Instructions (Signed)
It was good to see you today!  Come back to see me in a month to make sure you're doing okay

## 2016-05-12 NOTE — Progress Notes (Signed)
Subjective:   [id number] Daniel Nelson interpreter:   Daniel Nelson is a 30 y.o. male who [facility] for Left ear pain:  1.  Left ear pain:  Persists.  Describes as "constant pain" but worse when he opens mouth wide or eats.  Seen at Urgent Care since our last Visit where he was diagnosed with TMJ pain. Prescribed at that visit naproxen, Flexeril, prednisone taper . States he's received no pain relief for this. His mom presents them to clinic today stating that she wakens him almost every night at 2 or 3 in the morning due to this pain. Occasion the pain is been a few gets dizzy.  Also occasionally has joint aches in hands and elbows and knees. However this is not consistent. Also difficulty driving secondary to his pain.  He received no pain relief from the Tegretol since last visit.  ROS as above per HPI.    The following portions of the patient's history were reviewed and updated as appropriate: allergies, current medications, past medical history, family and social history, and problem list. Patient is a nonsmoker.    PMH reviewed.  Past Medical History:  Diagnosis Date  . Paresthesia    Past Surgical History:  Procedure Laterality Date  . APPENDECTOMY    . EYE SURGERY Right "as a young child"    Medications reviewed. Current Outpatient Prescriptions  Medication Sig Dispense Refill  . cyclobenzaprine (FLEXERIL) 10 MG tablet Take 1 tablet (10 mg total) by mouth at bedtime. 20 tablet 0  . DULoxetine (CYMBALTA) 30 MG capsule TAKE ONE CAPSULE BY MOUTH DAILY 90 capsule 1  . Ergocalciferol 2000 units TABS Take 1 tablet by mouth daily. 30 tablet 3  . naproxen (NAPROSYN) 500 MG tablet Take 1 tablet (500 mg total) by mouth 2 (two) times daily. 20 tablet 0  . predniSONE (STERAPRED UNI-PAK 21 TAB) 10 MG (21) TBPK tablet Dispense one 6 day pack. Take as directed with food. 21 tablet 0   No current facility-administered medications for this visit.      Objective:   Physical  Exam BP 111/76   Pulse 90   Temp 98.3 F (36.8 C) (Oral)   Ht 6\' 1"  (1.854 m)   Wt 153 lb 6.4 oz (69.6 kg)   BMI 20.24 kg/m  Gen:  Alert, cooperative patient who appears stated age in no acute distress.  Vital signs reviewed. Head: [place]/AT.   Eyes:  EOMI, PERRL.   Ears:  External ears WNL, Bilateral TM's normal without retraction, redness or bulging. Nose:  Septum midline  Mouth:  MMM, tonsils non-erythematous, non-edematous.   Neuro:  Cranial nerves intact. No focal deficits noted.  No results found for this or any previous visit (from the past 72 hour(s)).

## 2016-05-13 NOTE — Assessment & Plan Note (Signed)
Persists.  Most likely TMJ.  Patient has started to become frustrated with all the doctor visits and yet no answers.  Trying tramadol as a stronger analgesic. -Can also try TCA plus or minus benzodiazepine at night to help with some anxiety.  Of note his mother states that he does exhibit some signs of depression. However patient adamantly denies this. Further questioning persistently asks is whether the medications he is taking have any psychological side effects as he does not want anything for depression or anxiety. If pain persists can refer to headache clinic next visit.

## 2016-05-14 ENCOUNTER — Telehealth: Payer: Self-pay | Admitting: Family Medicine

## 2016-05-14 NOTE — Telephone Encounter (Signed)
Patient is out of work for chronic headaches.  He can probably still go to school.  I can look over this when I get back on Monday.

## 2016-05-14 NOTE — Telephone Encounter (Signed)
Daniel Nelson needs clarification on forms Dr. Gwendolyn GrantWalden sent in putting pt out of work  30 days, why? More details are needed. Can pt go to school? Please advise. Thanks! ep

## 2016-05-20 ENCOUNTER — Telehealth: Payer: Self-pay | Admitting: Family Medicine

## 2016-05-20 MED ORDER — TRAMADOL HCL 50 MG PO TABS: 50.0000 mg | ORAL_TABLET | Freq: Three times a day (TID) | ORAL | 0 refills | Status: DC | PRN

## 2016-05-20 NOTE — Telephone Encounter (Signed)
Patient in to clinic today stating that he wasn't able to fill his tramadol script due to his wife throwing it away.  [facility] [place] interpreter [phone] was used during their interaction.  PCP was not in the office and this concern was precepted with Dr. Pollie MeyerMcIntyre who checked the controlled medication database.  Script refilled and printed for 1 month and patient instructed to make an appointment with Dr. Gwendolyn GrantWalden before running out of this medication. Eldredge Veldhuizen,CMA

## 2016-05-31 ENCOUNTER — Emergency Department (HOSPITAL_COMMUNITY)
Admission: EM | Admit: 2016-05-31 | Discharge: 2016-05-31 | Disposition: A | Discharge status: Home / Self Care | Payer: Medicaid Other | Attending: Emergency Medicine | Admitting: Emergency Medicine

## 2016-05-31 ENCOUNTER — Encounter (HOSPITAL_COMMUNITY): Payer: Self-pay | Admitting: *Deleted

## 2016-05-31 DIAGNOSIS — F17210 Nicotine dependence, cigarettes, uncomplicated: Secondary | ICD-10-CM | POA: Insufficient documentation

## 2016-05-31 DIAGNOSIS — J039 Acute tonsillitis, unspecified: Secondary | ICD-10-CM | POA: Diagnosis not present

## 2016-05-31 DIAGNOSIS — R509 Fever, unspecified: Secondary | ICD-10-CM | POA: Diagnosis present

## 2016-05-31 LAB — COMPREHENSIVE METABOLIC PANEL
ALBUMIN: 4.2 g/dL (ref 3.5–5.0)
ALT: 10 U/L — AB (ref 17–63)
AST: 15 U/L (ref 15–41)
Alkaline Phosphatase: 36 U/L — ABNORMAL LOW (ref 38–126)
Anion gap: 10 (ref 5–15)
BUN: 12 mg/dL (ref 6–20)
CHLORIDE: 100 mmol/L — AB (ref 101–111)
CO2: 23 mmol/L (ref 22–32)
CREATININE: 1.05 mg/dL (ref 0.61–1.24)
Calcium: 8.8 mg/dL — ABNORMAL LOW (ref 8.9–10.3)
GFR calc Af Amer: >60 mL/min (ref >60)
GLUCOSE: 102 mg/dL — AB (ref 65–99)
POTASSIUM: 3.6 mmol/L (ref 3.5–5.1)
SODIUM: 133 mmol/L — AB (ref 135–145)
Total Bilirubin: 0.8 mg/dL (ref 0.3–1.2)
Total Protein: 6.9 g/dL (ref 6.5–8.1)

## 2016-05-31 LAB — CBC WITH DIFFERENTIAL/PLATELET
BASOS ABS: 0 10*3/uL (ref 0.0–0.1)
BASOS PCT: 0 %
EOS PCT: 0 %
Eosinophils Absolute: 0 10*3/uL (ref 0.0–0.7)
HCT: 41.9 % (ref 39.0–52.0)
Hemoglobin: 14.5 g/dL (ref 13.0–17.0)
LYMPHS PCT: 14 %
Lymphs Abs: 2.5 10*3/uL (ref 0.7–4.0)
MCH: 31.5 pg (ref 26.0–34.0)
MCHC: 34.6 g/dL (ref 30.0–36.0)
MCV: 90.9 fL (ref 78.0–100.0)
Monocytes Absolute: 1.2 10*3/uL — ABNORMAL HIGH (ref 0.1–1.0)
Monocytes Relative: 7 %
Neutro Abs: 14.6 10*3/uL — ABNORMAL HIGH (ref 1.7–7.7)
Neutrophils Relative %: 80 %
PLATELETS: 113 10*3/uL — AB (ref 150–400)
RBC: 4.61 MIL/uL (ref 4.22–5.81)
RDW: 12.5 % (ref 11.5–15.5)
WBC: 18.3 10*3/uL — AB (ref 4.0–10.5)

## 2016-05-31 LAB — RAPID STREP SCREEN (MED CTR MEBANE ONLY): STREPTOCOCCUS, GROUP A SCREEN (DIRECT): NEGATIVE

## 2016-05-31 MED ORDER — ONDANSETRON HCL 4 MG/2ML IJ SOLN
4.0000 mg | Freq: Once | INTRAMUSCULAR | Status: AC
Start: 2016-05-31 — End: 2016-05-31
  Administered 2016-05-31: 4 mg via INTRAVENOUS
  Filled 2016-05-31: qty 2

## 2016-05-31 MED ORDER — CLINDAMYCIN HCL 300 MG PO CAPS: 300.0000 mg | ORAL_CAPSULE | Freq: Four times a day (QID) | ORAL | 0 refills | Status: DC

## 2016-05-31 MED ORDER — KETOROLAC TROMETHAMINE 30 MG/ML IJ SOLN
30.0000 mg | Freq: Once | INTRAMUSCULAR | Status: AC
  Administered 2016-05-31: 30 mg via INTRAVENOUS
  Filled 2016-05-31: qty 1

## 2016-05-31 MED ORDER — CLINDAMYCIN HCL 150 MG PO CAPS
300.0000 mg | ORAL_CAPSULE | Freq: Once | ORAL | Status: AC
  Administered 2016-05-31: 300 mg via ORAL
  Filled 2016-05-31: qty 2

## 2016-05-31 MED ORDER — SODIUM CHLORIDE 0.9 % IV BOLUS (SEPSIS)
1000.0000 mL | Freq: Once | INTRAVENOUS | Status: AC
  Administered 2016-05-31: 1000 mL via INTRAVENOUS

## 2016-05-31 MED ORDER — METOCLOPRAMIDE HCL 10 MG PO TABS: 10.0000 mg | ORAL_TABLET | Freq: Four times a day (QID) | ORAL | 0 refills | Status: DC | PRN

## 2016-05-31 NOTE — ED Provider Notes (Signed)
[facility]-EMERGENCY DEPT Provider Note   CSN: [id number] Arrival date & time: 05/31/16  1716     History   Chief Complaint Chief Complaint  Patient presents with  . Fever  . Headache  . Emesis    HPI Daniel Nelson is a 30 y.o. male otherwise healthy here with sore throat, headaches, vomiting, myalgias. Patient states that he's been having sore throat since yesterday. States that he also had some mild headaches as well as subjective fevers. Had several episodes of nausea vomiting as well. Patient denies any sick contacts or any cough or urinary symptoms. Denies any sick contacts. Denies any trouble swallowing or breathing.  The history is provided by the patient. A language interpreter was used.   Arabic interpreter used   Past Medical History:  Diagnosis Date  . Paresthesia     Patient Active Problem List   Diagnosis Date Noted  . Deviated septum 03/27/2016  . Paresthesias 02/28/2016  . Chronic headaches 01/24/2016  . Epigastric pain 01/24/2016  . Myalgia 11/22/2015  . Seasonal allergies 11/11/2015  . H. pylori infection 08/17/2015  . Refugee health examination 08/01/2015  . Otalgia 08/01/2015  . H/O eye surgery 08/01/2015    Past Surgical History:  Procedure Laterality Date  . APPENDECTOMY    . EYE SURGERY Right "as a young child"       Home Medications    Prior to Admission medications   Medication Sig Start Date End Date Taking? Authorizing Provider  IBUPROFEN PO Take 1 tablet by mouth every 6 (six) hours as needed (pain).   Yes Historical Provider, MD  traMADol (ULTRAM) 50 MG tablet Take 1 tablet (50 mg total) by mouth every 8 (eight) hours as needed. Patient taking differently: Take 50 mg by mouth every 8 (eight) hours as needed (pain).  05/20/16  Yes Latrelle Dodrill, MD  clindamycin (CLEOCIN) 300 MG capsule Take 1 capsule (300 mg total) by mouth 4 (four) times daily. X 7 days 05/31/16   Charlynne Pander, MD  cyclobenzaprine (FLEXERIL) 10 MG  tablet Take 1 tablet (10 mg total) by mouth at bedtime. Patient not taking: Reported on 05/31/2016 05/04/16   Domenick Gong, MD  DULoxetine (CYMBALTA) 30 MG capsule TAKE ONE CAPSULE BY MOUTH DAILY Patient not taking: Reported on 05/31/2016 04/29/16   Tobey Grim, MD  Ergocalciferol 2000 units TABS Take 1 tablet by mouth daily. Patient not taking: Reported on 05/31/2016 04/22/16   Lorne Skeens, MD  naproxen (NAPROSYN) 500 MG tablet Take 1 tablet (500 mg total) by mouth 2 (two) times daily. Patient not taking: Reported on 05/31/2016 05/04/16   Domenick Gong, MD  predniSONE (STERAPRED UNI-PAK 21 TAB) 10 MG (21) TBPK tablet Dispense one 6 day pack. Take as directed with food. Patient not taking: Reported on 05/31/2016 05/04/16   Domenick Gong, MD    Family History Family History  Problem Relation Age of Onset  . Rheum arthritis Mother   . Healthy Father   . Colon cancer Neg Hx     Social History Social History  Substance Use Topics  . Smoking status: Current Some Day Smoker    Types: Cigarettes  . Smokeless tobacco: Never Used     Comment: tobacco info given 01/07/16  . Alcohol use No     Allergies   Dicyclomine   Review of Systems Review of Systems  Constitutional: Positive for fever.  Gastrointestinal: Positive for vomiting.  Neurological: Positive for headaches.  All other systems reviewed and are  negative.    Physical Exam Updated Vital Signs BP 104/69   Pulse 105   Temp 98.4 F (36.9 C) (Oral)   Resp 18   SpO2 98%   Physical Exam  Constitutional: He is oriented to person, place, and time. He appears well-developed and well-nourished.  HENT:  Head: Normocephalic.  Tonsils enlarged with mild exudates. Uvula midline, no soft palate swelling.   Eyes: EOM are normal. Pupils are equal, round, and reactive to light.  Neck: Normal range of motion.  Mild cervical LAD. No meningeal signs   Cardiovascular: Normal rate, regular rhythm and  normal heart sounds.   Pulmonary/Chest: Effort normal and breath sounds normal. No respiratory distress. He has no wheezes. He has no rales.  Abdominal: Soft. Bowel sounds are normal. He exhibits no distension. There is no tenderness. There is no guarding.  Musculoskeletal: Normal range of motion.  Neurological: He is alert and oriented to person, place, and time. He displays normal reflexes. No cranial nerve deficit. Coordination normal.  Skin: Skin is warm.  Psychiatric: He has a normal mood and affect.  Nursing note and vitals reviewed.    ED Treatments / Results  Labs (all labs ordered are listed, but only abnormal results are displayed) Labs Reviewed  CBC WITH DIFFERENTIAL/PLATELET - Abnormal; Notable for the following:       Result Value   WBC 18.3 (*)    Platelets 113 (*)    Neutro Abs 14.6 (*)    Monocytes Absolute 1.2 (*)    All other components within normal limits  COMPREHENSIVE METABOLIC PANEL - Abnormal; Notable for the following:    Sodium 133 (*)    Chloride 100 (*)    Glucose, Bld 102 (*)    Calcium 8.8 (*)    ALT 10 (*)    Alkaline Phosphatase 36 (*)    All other components within normal limits  RAPID STREP SCREEN (NOT AT [facility])  CULTURE, GROUP A STREP [facility])    EKG  EKG Interpretation None       Radiology No results found.  Procedures Procedures (including critical care time)  Medications Ordered in ED Medications  clindamycin (CLEOCIN) capsule 300 mg (not administered)  sodium chloride 0.9 % bolus 1,000 mL (0 mLs Intravenous Stopped 05/31/16 1904)  ondansetron (ZOFRAN) injection 4 mg (4 mg Intravenous Given 05/31/16 1759)  ketorolac (TORADOL) 30 MG/ML injection 30 mg (30 mg Intravenous Given 05/31/16 1759)     Initial Impression / Assessment and Plan / ED Course  I have reviewed the triage vital signs and the nursing notes.  Pertinent labs & imaging results that were available during my care of the patient were reviewed by me and considered  in my medical decision making (see chart for details).  Clinical Course    Daniel Nelson is a 30 y.o. male here with sore throat, subjective fever. Has obvious tonsillitis on exam. Consider strep vs viral vs mono. Symptoms for a day so mono spot won't be positive yet. Will check labs, rapid strep. Will hydrate and reassess.   8:01 PM Felt better after IVF. Afebrile. Well appearing. Rapid strep neg but WBC 18. Will give clinda for tonsillitis, can still be viral. Recommend tylenol, motrin for pain, continual hydration. No signs of PTA or RPA.     Final Clinical Impressions(s) / ED Diagnoses   Final diagnoses:  Tonsillitis    New Prescriptions New Prescriptions   CLINDAMYCIN (CLEOCIN) 300 MG CAPSULE    Take 1 capsule (300 mg  total) by mouth 4 (four) times daily. X 7 days     Charlynne Panderavid Hsienta Blayze Haen, MD 05/31/16 2002

## 2016-05-31 NOTE — ED Triage Notes (Signed)
Used interpretor [id number], pt speaks arabic. Pt reports having headache, n/v, fever and sore throat since yesterday. Denies recent travel. Denies diarrhea. Mask on pt at triage.

## 2016-05-31 NOTE — Discharge Instructions (Signed)
Take clindamycin four times daily for a week.   Take reglan as needed for nausea.   Take tylenol, motrin for fever.   See your doctor  Return to ER if you have worse sore throat, unable to swallowing, drooling, fever for a week.

## 2016-05-31 NOTE — ED Notes (Signed)
Pt understood dc material. NAD noted. [facility] at [facility]. Telephone interpreter services used for discharge process

## 2016-06-01 LAB — CULTURE, GROUP A STREP (THRC)

## 2016-06-09 ENCOUNTER — Encounter: Payer: Self-pay | Admitting: Family Medicine

## 2016-06-09 ENCOUNTER — Ambulatory Visit (INDEPENDENT_AMBULATORY_CARE_PROVIDER_SITE_OTHER): Payer: Medicaid Other | Admitting: Family Medicine

## 2016-06-09 VITALS — BP 115/75 | HR 84 | Temp 97.9°F | Ht 73.0 in | Wt 154.4 lb

## 2016-06-09 DIAGNOSIS — F411 Generalized anxiety disorder: Secondary | ICD-10-CM | POA: Diagnosis not present

## 2016-06-09 DIAGNOSIS — R51 Headache: Secondary | ICD-10-CM

## 2016-06-09 DIAGNOSIS — H9202 Otalgia, left ear: Secondary | ICD-10-CM | POA: Diagnosis not present

## 2016-06-09 DIAGNOSIS — G8929 Other chronic pain: Secondary | ICD-10-CM

## 2016-06-09 MED ORDER — CITALOPRAM HYDROBROMIDE 20 MG PO TABS: 20.0000 mg | ORAL_TABLET | Freq: Every day | ORAL | 1 refills | Status: DC

## 2016-06-09 NOTE — Progress Notes (Signed)
Subjective:   Multiple [employer] interpreters were used throughout the interview as the video interpreter kept cutting out.    Daniel Nelson is a 30 y.o. male who [facility] for anxiety and headaches:  1.  Anxiety:  He presents today with a male friend who is also a patient at our clinic and a refugee.  The patient reports increasing anxiety and generalized fear.  He states he is often afraid to leave his house.  He fears for his children when they are out of his sight.  He tries to drive but often gets fearful when driving.  Describes also easy irritability and anger.  Would like something to help with this anxiety.  No SI/HI.    2.  Headaches:  Persist, somewhat better than previously.  Still mostly centered around Left ear.  Feels this is making his anxiety worse because he doesn't know why he is "sick" or what's going on with his head.  No vision or hearing changes.     ROS as above per HPI.    The following portions of the patient's history were reviewed and updated as appropriate: allergies, current medications, past medical history, family and social history, and problem list. Patient is a nonsmoker.    PMH reviewed.  Past Medical History:  Diagnosis Date  . Paresthesia    Past Surgical History:  Procedure Laterality Date  . APPENDECTOMY    . EYE SURGERY Right "as a young child"    Medications reviewed.  Objective:   Physical Exam BP 115/75   Pulse 84   Temp 97.9 F (36.6 C) (Oral)   Ht 6\' 1"  (1.854 m)   Wt 154 lb 6.4 oz (70 kg)   BMI 20.37 kg/m  Gen:  Alert, cooperative patient who appears stated age in no acute distress.  Vital signs reviewed. Psych:  Became close to teary several times, but not outright crying.    No results found for this or any previous visit (from the past 72 hour(s)).

## 2016-06-09 NOTE — Progress Notes (Signed)
Dr. Gwendolyn GrantWalden requested a Behavioral Health Consult.   Presenting Issue:  Anxiety  Issues discussed: Patient stated interest in working with someone to help with his anxiety, as he is concerned about how it is impacting his health. Patient's anxiety presentation was not discussed in depth, as his friend was in the room with him and the interpreter was cutting in and out. [facility] and this appointment was scheduled.

## 2016-06-10 DIAGNOSIS — F411 Generalized anxiety disorder: Secondary | ICD-10-CM | POA: Insufficient documentation

## 2016-06-10 NOTE — Assessment & Plan Note (Signed)
-   By far the most helpful thing today was the presence of his [place] friend, who is also being treated for anxiety and likely PTSD as well.  - The patient is interested in starting therapy.  We will start Celexa today.  FU in 2-3 weeks for recheck. - I have also brought Bon[facility] to meet the patient.  She has been very helpful helping the patient's friend as well, and he spoke highly of her. - Plan to continue behavioral health consultation in our clinic for next several weeks. - No red flags. No SI/HI. - See back in 2-3 weeks.

## 2016-06-10 NOTE — Assessment & Plan Note (Signed)
Persists.   Plan referral to headache clinic today.  I think this will likely improve as we are now starting treatment for anxiety.

## 2016-06-16 ENCOUNTER — Ambulatory Visit (INDEPENDENT_AMBULATORY_CARE_PROVIDER_SITE_OTHER): Payer: Medicaid Other | Admitting: Psychology

## 2016-06-16 DIAGNOSIS — F411 Generalized anxiety disorder: Secondary | ICD-10-CM

## 2016-06-16 NOTE — Progress Notes (Signed)
Daniel Nelson came in for a followup appointment following a brief warm handoff last week Various interpreters on video and phone were used during this appointment due to dropped calls   Presenting Issue:  Anxiety, specifically about his pain/health  Report of symptoms:  Patient reports that he has been very worried since he started feeling sick in May. His symptoms include dizziness, nausea, headaches, arm pain, pain in his left ear, and poor concentration. In addition to the pain itself, patient reports being worried about the pain. For instance, he worries that he will be in this pain for the rest of his life, or worries that something serious is wrong with him.   Duration of CURRENT symptoms:  Symptoms have been present since May of this year.  Impact on function:  His pain gets in the way of his work and his enjoyment of activities outside of work. He also misses out on doing things due to fears that his pain will start if he goes somewhere. For instance, he doesn't take his children out places because he is nervous that his ear pain will start and he will be too dizzy and in too much pain to drive them home. Patient also reports that he hasn't enjoyed anything for the last three months. He goes to sleep at 5pm when he gets home from work and wakes up at 10pm to eat something, then goes to sleep again. He states that he sleeps at 5pm because he feels very tired, usually because of his pain in his ear and head.   Family history of psychiatric issues:  Not assessed  Current and history of substance use:  Patient denies alcohol or drug use  Trauma History: Patient reports that he and his family came to the [place] about a year ago. He is from [place] but they left due to the war. Patient stated that he saw dead bodies everywhere on his way to work, and he and his family eventually fled to [place]. Patient stated that it was worse in [place] because they were treated badly there. He still thinks about [place]  because his brothers are there and he worries about them. Patient denied intrusive thoughts or nightmares related to traumatic events.

## 2016-06-16 NOTE — Assessment & Plan Note (Signed)
Assessment / Plan / Recommendations: Patient is distressed about the cause of his physical symptoms. He feels that he needs to see another neurologist and needs to see an ENT to check his left ear, which is still causing him pain. He also feels that he needs a repeat MRI, though states that the previous MRI that was done over the summer came back clear. He feels strongly that there is something wrong with him. He denied having worries about anything besides his pain/health.   Patient picked up his citalopram last week and brought the bottle to the appointment. He stated that he took it once but felt nauseous and his head hurt. He would like to know whether he should still take it or if his doctor will prescribe him a different medication. [facility] Dr. Gwendolyn GrantWalden and told patient that someone will be in touch.   This patient has catastrophizing thoughts about his pain that likely make the pain worse and harder to tolerate. He would likely benefit from cognitive behavioral strategies to change thoughts and behaviors that surround his pain. [facility] also led patient in a deep breathing relaxation exercise today, which he feels okay about and states he will practice on his own. Patient reports that he can only come in at 4pm so he was scheduled for 3 weeks out (this is a popular time slot).

## 2016-06-24 ENCOUNTER — Encounter (HOSPITAL_COMMUNITY): Payer: Self-pay

## 2016-06-24 ENCOUNTER — Emergency Department (HOSPITAL_COMMUNITY)
Admission: EM | Admit: 2016-06-24 | Discharge: 2016-06-24 | Disposition: A | Discharge status: Home / Self Care | Payer: Medicaid Other | Attending: Emergency Medicine | Admitting: Emergency Medicine

## 2016-06-24 DIAGNOSIS — F17210 Nicotine dependence, cigarettes, uncomplicated: Secondary | ICD-10-CM | POA: Diagnosis not present

## 2016-06-24 DIAGNOSIS — Z79899 Other long term (current) drug therapy: Secondary | ICD-10-CM | POA: Diagnosis not present

## 2016-06-24 DIAGNOSIS — G4489 Other headache syndrome: Secondary | ICD-10-CM | POA: Diagnosis not present

## 2016-06-24 DIAGNOSIS — R51 Headache: Secondary | ICD-10-CM | POA: Diagnosis present

## 2016-06-24 MED ORDER — KETOROLAC TROMETHAMINE 60 MG/2ML IM SOLN
60.0000 mg | Freq: Once | INTRAMUSCULAR | Status: AC
Start: 2016-06-24 — End: 2016-06-24
  Administered 2016-06-24: 60 mg via INTRAMUSCULAR
  Filled 2016-06-24: qty 2

## 2016-06-24 NOTE — ED Triage Notes (Signed)
Arabic interpreter [place], [phone] used for translation. Pt c/o burning pain behind ears, headache, inability to sleep, nausea, cough. Pt has hx same.

## 2016-06-24 NOTE — ED Provider Notes (Signed)
[facility]-EMERGENCY DEPT Provider Note   CSN: [id number] Arrival date & time: 06/24/16  0703   History   Chief Complaint Chief Complaint  Patient presents with  . Headache    HPI Daniel Nelson is a 30 y.o. male with a PMH of anxiety and chronic headaches who presents to the ED with headache for the last 5 days. The headache became worse yesterday. The headache is located in the back of the left side of his head and feels like a "burning" pain. The pain radiates from the back of his head, up the left side of his head to his left eye. The pain is worse with moving his neck. Nothing makes the pain better. He has not tried any medications. He states his head feels heavy. He also notes that he feels "unbalanced". He states that he does not feel like the room is spinning and does not feel like he is going to pass out. He has not had any loss of consciousness. He endorses associated nausea, no vomiting. He denies photophobia. He endorses occasional phonophobia. He has had headaches in the past, but states that this pain is worse than before. He endorses blurred vision in his left eye and pressure in his left ear. He denies tinnitus. He denies fevers, chills, or sore throat. He endorses neck pain and rhinorrhea. He endorses occasional numbness and tingling in his arms, L > R. He endorses weakness left arm and states he "can't hold onto the steering wheel". He denies any head trauma. Of note, he came to the ED with his wife and daughter. His daughter is currently being evaluated in the [facility] ED.  He had a CT head performed 01/19/2016 which was normal.  Richardean Chimera ID [phone] utilized during interview.  HPI  Past Medical History:  Diagnosis Date  . Paresthesia     Patient Active Problem List   Diagnosis Date Noted  . Anxiety state 06/10/2016  . Deviated septum 03/27/2016  . Paresthesias 02/28/2016  . Chronic headaches 01/24/2016  . Epigastric pain 01/24/2016  . Myalgia 11/22/2015  .  Seasonal allergies 11/11/2015  . H. pylori infection 08/17/2015  . Refugee health examination 08/01/2015  . Otalgia 08/01/2015  . H/O eye surgery 08/01/2015    Past Surgical History:  Procedure Laterality Date  . APPENDECTOMY    . EYE SURGERY Right "as a young child"      Home Medications    Prior to Admission medications   Medication Sig Start Date End Date Taking? Authorizing Provider  citalopram (CELEXA) 20 MG tablet Take 1 tablet (20 mg total) by mouth daily. 06/09/16   Tobey Grim, MD  cyclobenzaprine (FLEXERIL) 10 MG tablet Take 1 tablet (10 mg total) by mouth at bedtime. Patient not taking: Reported on 05/31/2016 05/04/16   Domenick Gong, MD  DULoxetine (CYMBALTA) 30 MG capsule TAKE ONE CAPSULE BY MOUTH DAILY Patient not taking: Reported on 05/31/2016 04/29/16   Tobey Grim, MD  Ergocalciferol 2000 units TABS Take 1 tablet by mouth daily. Patient not taking: Reported on 05/31/2016 04/22/16   Lorne Skeens, MD  IBUPROFEN PO Take 1 tablet by mouth every 6 (six) hours as needed (pain).    Historical Provider, MD  metoCLOPramide (REGLAN) 10 MG tablet Take 1 tablet (10 mg total) by mouth every 6 (six) hours as needed for nausea (nausea/headache). 05/31/16   Charlynne Pander, MD  naproxen (NAPROSYN) 500 MG tablet Take 1 tablet (500 mg total) by mouth 2 (two) times daily.  Patient not taking: Reported on 05/31/2016 05/04/16   Domenick GongAshley Mortenson, MD  predniSONE (STERAPRED UNI-PAK 21 TAB) 10 MG (21) TBPK tablet Dispense one 6 day pack. Take as directed with food. Patient not taking: Reported on 05/31/2016 05/04/16   Domenick GongAshley Mortenson, MD  traMADol (ULTRAM) 50 MG tablet Take 1 tablet (50 mg total) by mouth every 8 (eight) hours as needed. Patient taking differently: Take 50 mg by mouth every 8 (eight) hours as needed (pain).  05/20/16   Latrelle DodrillBrittany J McIntyre, MD    Family History Family History  Problem Relation Age of Onset  . Rheum arthritis Mother   . Healthy  Father   . Colon cancer Neg Hx     Social History Social History  Substance Use Topics  . Smoking status: Current Some Day Smoker    Types: Cigarettes  . Smokeless tobacco: Never Used     Comment: tobacco info given 01/07/16  . Alcohol use No     Allergies   Dicyclomine   Review of Systems Review of Systems 10 Systems reviewed and are negative for acute change except as noted in the HPI.  Physical Exam Updated Vital Signs BP 118/67 (BP Location: Right Arm)   Pulse 60   Temp 98 F (36.7 C) (Oral)   Resp 17   SpO2 98%   Physical Exam  Constitutional: He is oriented to person, place, and time. He appears well-developed and well-nourished. No distress.  HENT:  Head: Normocephalic and atraumatic.  Right Ear: Tympanic membrane normal.  Left Ear: Tympanic membrane normal.  Eyes: Conjunctivae and EOM are normal. Pupils are equal, round, and reactive to light.  Neck: Normal range of motion. Neck supple.  Cardiovascular: Normal rate and regular rhythm.   No murmur heard. Pulmonary/Chest: Effort normal and breath sounds normal. No respiratory distress.  Abdominal: Soft. Bowel sounds are normal. He exhibits no distension. There is no tenderness. There is no rebound and no guarding.  Musculoskeletal: Normal range of motion. He exhibits no edema or tenderness.  Lymphadenopathy:    He has no cervical adenopathy.  Neurological: He is alert and oriented to person, place, and time. He has normal strength. He displays normal reflexes. No cranial nerve deficit or sensory deficit. He exhibits normal muscle tone. Coordination and gait normal.  Skin: Skin is warm and dry. Capillary refill takes less than 2 seconds.  Psychiatric: He has a normal mood and affect. His behavior is normal.     ED Treatments / Results  Labs (all labs ordered are listed, but only abnormal results are displayed) Labs Reviewed - No data to display  EKG  EKG Interpretation None       Radiology No  results found.  Procedures Procedures (including critical care time)  Medications Ordered in ED Medications  ketorolac (TORADOL) injection 60 mg (60 mg Intramuscular Given 06/24/16 0825)     Initial Impression / Assessment and Plan / ED Course  I have reviewed the triage vital signs and the nursing notes.  Clinical Course    Sofie HartiganMohamad is a 30 year old male presenting to the ED with L sided headache for the last two days. No red flags. No fevers, no chills, no neck stiffness to suggest meningitis. Neuro exam is completely normal. Pt has a recently negative CT head, making tumor unlikely. Think this is likely a migraine. Will treat with Toradol 60mg  IM x 1.  9:00AM: After receiving Toradol, Pt states that his headache has completely resolved and is ready to go  home. Pt is safe for discharge.  Final Clinical Impressions(s) / ED Diagnoses   Final diagnoses:  Other headache syndrome    New Prescriptions Discharge Medication List as of 06/24/2016  8:59 AM       Campbell StallKaty Dodd Mayo, MD 06/24/16 [id number]    Laurence Spatesachel Morgan Little, MD 06/24/16 1143

## 2016-07-07 ENCOUNTER — Ambulatory Visit: Payer: Medicaid Other

## 2016-07-09 ENCOUNTER — Encounter: Payer: Self-pay | Admitting: Neurology

## 2016-07-09 ENCOUNTER — Ambulatory Visit (INDEPENDENT_AMBULATORY_CARE_PROVIDER_SITE_OTHER): Payer: Medicaid Other | Admitting: Neurology

## 2016-07-09 VITALS — BP 112/71 | HR 68 | Ht 73.0 in | Wt 154.0 lb

## 2016-07-09 DIAGNOSIS — F411 Generalized anxiety disorder: Secondary | ICD-10-CM

## 2016-07-09 DIAGNOSIS — G44209 Tension-type headache, unspecified, not intractable: Secondary | ICD-10-CM | POA: Diagnosis not present

## 2016-07-09 MED ORDER — NORTRIPTYLINE HCL 25 MG PO CAPS: 50.0000 mg | ORAL_CAPSULE | Freq: Every day | ORAL | 11 refills | Status: DC

## 2016-07-09 MED ORDER — INDOMETHACIN 50 MG PO CAPS: 50.0000 mg | ORAL_CAPSULE | Freq: Two times a day (BID) | ORAL | 6 refills | Status: DC | PRN

## 2016-07-09 NOTE — Progress Notes (Signed)
PATIENT: Daniel Nelson DOB: 1985-09-22  Chief Complaint  Patient presents with  . Headache    He is here with his mother, [name] and an interpreter from [place].  He has been having headaches, of varying degrees, nearly everyday.  He does not take any medication for the pain.  He sometimes has dizziness with his headaches.     HISTORICAL  Daniel Nelson is a 30 years old right-handed male, native of [place], he is with his interpreter at today's clinical visit, seen in refer by his primary care Dr. Gildardo Cranker for evaluation of numbness tingling on the left side of his body,Initial evaluation was on  March 10 2016.  He came to [place] from [place] in August 2016, he used to work at [employer] care at [employer], he stopped working since March 2017 because constellation of complaints, including dizziness, trouble walking,  He complains of pain at left side of his face and neck, radiating to both arm and legs,  he complains of intermittent trouble walking, blurry vision, feeling tired, left eye is worse, intermittent episode of numbness throughout his body, especially at left fourth and fifth fingers,   Recently he also complains of loss of appetite, poor sleep, frequent headaches, he has children at age 18 and 38, his wife is currently pregnant.   I reviewed laboratory evaluation since 2017, normal CBC, CMP, CPK, TSH, lipase level, C-reactive protein, ESR negative HIV  I personally reviewed CAT scan of the brain July 2017 that was normal, MRA of the brain was normal, MRA of the cervical spine showed mild degenerative disc disease there was no evidence of cord compression.  UPDATE Jul 09 2016: He complains of left side headache and left ear pain since May 2017, constant 4/10, his headache can go up to 10/10, multiple episodes during the day, blurry vision, no hearing loss, no light noise sensitivity, no nausea,  He also complains of depression, anxiety, insomnia, sleepiness,  headaches, weakness, ,  REVIEW OF SYSTEMS: Full 14 system review of systems performed and notable only for as above  ALLERGIES: Allergies  Allergen Reactions  . Dicyclomine Anxiety    Potential anxiety and GI distress?    HOME MEDICATIONS: Current Outpatient Prescriptions  Medication Sig Dispense Refill  . citalopram (CELEXA) 20 MG tablet Take 1 tablet (20 mg total) by mouth daily. 30 tablet 1   No current facility-administered medications for this visit.     PAST MEDICAL HISTORY: Past Medical History:  Diagnosis Date  . Headache   . Paresthesia     PAST SURGICAL HISTORY: Past Surgical History:  Procedure Laterality Date  . APPENDECTOMY    . EYE SURGERY Right "as a young child"    FAMILY HISTORY: Family History  Problem Relation Age of Onset  . Rheum arthritis Mother   . Healthy Father   . Colon cancer Neg Hx     SOCIAL HISTORY:  Social History   Social History  . Marital status: Married    Spouse name: N/A  . Number of children: 2  . Years of education: N/A   Occupational History  . lawn care at [employer] History Main Topics  . Smoking status: Current Some Day Smoker    Types: Cigarettes  . Smokeless tobacco: Never Used     Comment: tobacco info given 01/07/16  . Alcohol use No  . Drug use: No  . Sexual activity: Not on file   Other Topics Concern  .  Not on file   Social History Narrative   [place] refugee -- arrived in the [place] on March 06, 2015.   Lives at home with wife and children.   Right-handed.   Occasional caffeine use.     PHYSICAL EXAM   Vitals:   07/09/16 0818  BP: 112/71  Pulse: 68  Weight: 154 lb (69.9 kg)  Height: _0  (1.854 m)    Not recorded      Body mass index is 20.32 kg/m.  PHYSICAL EXAMNIATION:  Gen: NAD, conversant, well nourised, obese, well groomed                     Cardiovascular: Regular rate rhythm, no peripheral edema, warm, nontender. Eyes: Conjunctivae clear  without exudates or hemorrhage Neck: Supple, no carotid bruise. Pulmonary: Clear to auscultation bilaterally   NEUROLOGICAL EXAM:  MENTAL STATUS: Speech:    Speech is normal; fluent and spontaneous with normal comprehension.  Cognition:     Orientation to time, place and person     Normal recent and remote memory     Normal Attention span and concentration     Normal Language, naming, repeating,spontaneous speech     Fund of knowledge   CRANIAL NERVES: CN II: Visual fields are full to confrontation. Fundoscopic exam is normal with sharp discs and no vascular changes. Pupils are round equal and briskly reactive to light. CN III, IV, VI: extraocular movement are normal. No ptosis. CN V: Facial sensation is intact to pinprick in all 3 divisions bilaterally. Corneal responses are intact.  CN VII: Face is symmetric with normal eye closure and smile. CN VIII: Hearing is normal to rubbing fingers, bilateral tympanic membranes were intact CN IX, X: Palate elevates symmetrically. Phonation is normal. CN XI: Head turning and shoulder shrug are intact CN XII: Tongue is midline with normal movements and no atrophy.  MOTOR: There is no pronator drift of out-stretched arms. Muscle bulk and tone are normal. Muscle strength is normal.  REFLEXES: Reflexes are 2+ and symmetric at the biceps, triceps, knees, and ankles. Plantar responses are flexor.  SENSORY: Intact to light touch, pinprick, positional sensation and vibratory sensation are intact in fingers and toes.  COORDINATION: Rapid alternating movements and fine finger movements are intact. There is no dysmetria on finger-to-nose and heel-knee-shin.    GAIT/STANCE: Posture is normal. Gait is steady with normal steps, base, arm swing, and turning. Heel and toe walking are normal. Tandem gait is normal.  Romberg is absent.   DIAGNOSTIC DATA (LABS, IMAGING, TESTING) - I reviewed patient records, labs, notes, testing and imaging myself  where available.   ASSESSMENT AND PLAN  Daniel Nelson is a 30 y.o. male   New-onset left-sided headaches Depression anxiety  Normal neurological examinations,  Most consistent with tension headache, related to his mood disorder  Start nortriptyline 25 mg titrating to 50 mg every night  Indomethacin pain 50 mg as needed  Continue Celexa 20 mg during the day    Marcial Pacas, M.D. Ph.D.  [facility] [address] [place], [place] [address] Ph: [phone] Fax: [phone]  XI:[id number] H Walden,

## 2016-07-11 ENCOUNTER — Emergency Department (HOSPITAL_COMMUNITY)
Admission: EM | Admit: 2016-07-11 | Discharge: 2016-07-11 | Disposition: A | Discharge status: Home / Self Care | Payer: Medicaid Other | Attending: Emergency Medicine | Admitting: Emergency Medicine

## 2016-07-11 ENCOUNTER — Encounter (HOSPITAL_COMMUNITY): Payer: Self-pay | Admitting: Emergency Medicine

## 2016-07-11 DIAGNOSIS — R51 Headache: Secondary | ICD-10-CM | POA: Diagnosis present

## 2016-07-11 DIAGNOSIS — F17210 Nicotine dependence, cigarettes, uncomplicated: Secondary | ICD-10-CM | POA: Insufficient documentation

## 2016-07-11 DIAGNOSIS — R519 Headache, unspecified: Secondary | ICD-10-CM

## 2016-07-11 MED ORDER — DIPHENHYDRAMINE HCL 50 MG/ML IJ SOLN
25.0000 mg | Freq: Once | INTRAMUSCULAR | Status: AC
  Administered 2016-07-11: 25 mg via INTRAMUSCULAR
  Filled 2016-07-11: qty 1

## 2016-07-11 MED ORDER — METOCLOPRAMIDE HCL 5 MG/ML IJ SOLN
10.0000 mg | Freq: Once | INTRAMUSCULAR | Status: AC
  Administered 2016-07-11: 10 mg via INTRAMUSCULAR
  Filled 2016-07-11: qty 2

## 2016-07-11 MED ORDER — IBUPROFEN 400 MG PO TABS
400.0000 mg | ORAL_TABLET | Freq: Once | ORAL | Status: AC
  Administered 2016-07-11: 400 mg via ORAL

## 2016-07-11 MED ORDER — KETOROLAC TROMETHAMINE 60 MG/2ML IM SOLN
60.0000 mg | Freq: Once | INTRAMUSCULAR | Status: AC
  Administered 2016-07-11: 60 mg via INTRAMUSCULAR
  Filled 2016-07-11: qty 2

## 2016-07-11 MED ORDER — IBUPROFEN 400 MG PO TABS
ORAL_TABLET | ORAL | Status: AC
  Filled 2016-07-11: qty 1

## 2016-07-11 NOTE — Discharge Instructions (Signed)
Continue regular medications. Follow up with family doctor.

## 2016-07-11 NOTE — ED Triage Notes (Signed)
Pt reports headache starting this morning similar to previous. States some dizziness. No neuro deficits in triage.

## 2016-07-11 NOTE — ED Notes (Signed)
Pt states he is ready to go home. Provider made aware.

## 2016-07-11 NOTE — ED Provider Notes (Signed)
[facility]-EMERGENCY DEPT Provider Note   CSN: [id number] Arrival date & time: 07/11/16  [id number]     History   Chief Complaint Chief Complaint  Patient presents with  . Dizziness  . Headache    HPI Daniel Nelson is a 30 y.o. male.  HPI Daniel Nelson is a 30 y.o. male with history of chronic headaches, presents to emergency department complaining of a headache. Patient states that he has constant daily headaches for which his doctor gives him "painkillers." He states that today the headache is worse than his regular daily headache. He did not take any medications prior to coming in. States pain is only on the left side of his face. It radiates from the ear towards the midline of the face. He denies any pain with opening or closing his mouth. He denies any dental injuries or issues.  Denies fever or chills. No neck pain. No visual changes. No numbness or weakness in extremities. No other complaints.   Arabic interpreter used.  Past Medical History:  Diagnosis Date  . Headache   . Paresthesia     Patient Active Problem List   Diagnosis Date Noted  . Tension-type headache, not intractable 07/09/2016  . Anxiety state 06/10/2016  . Deviated septum 03/27/2016  . Paresthesias 02/28/2016  . Chronic headaches 01/24/2016  . Epigastric pain 01/24/2016  . Myalgia 11/22/2015  . Seasonal allergies 11/11/2015  . H. pylori infection 08/17/2015  . Refugee health examination 08/01/2015  . Otalgia 08/01/2015  . H/O eye surgery 08/01/2015    Past Surgical History:  Procedure Laterality Date  . APPENDECTOMY    . EYE SURGERY Right "as a young child"       Home Medications    Prior to Admission medications   Medication Sig Start Date End Date Taking? Authorizing Provider  citalopram (CELEXA) 20 MG tablet Take 1 tablet (20 mg total) by mouth daily. 06/09/16   Tobey GrimJeffrey H Walden, MD  indomethacin (INDOCIN) 50 MG capsule Take 1 capsule (50 mg total) by mouth 2 (two) times daily  as needed. 07/09/16   Levert FeinsteinYijun Yan, MD  nortriptyline (PAMELOR) 25 MG capsule Take 2 capsules (50 mg total) by mouth at bedtime. 07/09/16   Levert FeinsteinYijun Yan, MD    Family History Family History  Problem Relation Age of Onset  . Rheum arthritis Mother   . Healthy Father   . Colon cancer Neg Hx     Social History Social History  Substance Use Topics  . Smoking status: Current Some Day Smoker    Types: Cigarettes  . Smokeless tobacco: Never Used     Comment: tobacco info given 01/07/16  . Alcohol use No     Allergies   Dicyclomine   Review of Systems Review of Systems  Constitutional: Negative for chills and fever.  HENT: Negative for facial swelling, sinus pressure and sore throat.   Eyes: Negative for photophobia and visual disturbance.  Respiratory: Negative for cough, chest tightness and shortness of breath.   Cardiovascular: Negative for chest pain, palpitations and leg swelling.  Gastrointestinal: Negative for nausea and vomiting.  Genitourinary: Negative for dysuria, frequency, hematuria and urgency.  Musculoskeletal: Negative for arthralgias, myalgias, neck pain and neck stiffness.  Skin: Negative for rash.  Allergic/Immunologic: Negative for immunocompromised state.  Neurological: Positive for dizziness and headaches. Negative for weakness, light-headedness and numbness.  All other systems reviewed and are negative.    Physical Exam Updated Vital Signs BP 95/68   Pulse 64   Temp 97.7  F (36.5 C) (Oral)   Resp 18   Ht 6\' 1"  (1.854 m)   Wt 70.5 kg   SpO2 99%   BMI 20.51 kg/m   Physical Exam  Constitutional: He is oriented to person, place, and time. He appears well-developed and well-nourished. No distress.  HENT:  Head: Normocephalic and atraumatic.  Right Ear: External ear normal.  Left Ear: External ear normal.  Nose: Nose normal.  Mouth/Throat: Oropharynx is clear and moist.  TMs normal bilaterally  Eyes: Conjunctivae and EOM are normal. Pupils are  equal, round, and reactive to light.  Neck: Neck supple.  No rigidity  Cardiovascular: Normal rate, regular rhythm and normal heart sounds.   Pulmonary/Chest: Effort normal. No respiratory distress. He has no wheezes. He has no rales.  Abdominal: Soft. Bowel sounds are normal. He exhibits no distension. There is no tenderness. There is no rebound.  Musculoskeletal: He exhibits no edema.  Neurological: He is alert and oriented to person, place, and time.  5/5 and equal upper and lower extremity strength bilaterally. Equal grip strength bilaterally. Normal finger to nose and heel to shin. No pronator drift. Patellar reflexes 2+   Skin: Skin is warm and dry. Capillary refill takes less than 2 seconds.  Nursing note and vitals reviewed.    ED Treatments / Results  Labs (all labs ordered are listed, but only abnormal results are displayed) Labs Reviewed - No data to display  EKG  EKG Interpretation None       Radiology No results found.  Procedures Procedures (including critical care time)  Medications Ordered in ED Medications  ibuprofen (ADVIL,MOTRIN) 400 MG tablet (not administered)  ibuprofen (ADVIL,MOTRIN) tablet 400 mg (400 mg Oral Given 07/11/16 0425)  ketorolac (TORADOL) injection 60 mg (60 mg Intramuscular Given 07/11/16 0647)  metoCLOPramide (REGLAN) injection 10 mg (10 mg Intramuscular Given 07/11/16 0646)  diphenhydrAMINE (BENADRYL) injection 25 mg (25 mg Intramuscular Given 07/11/16 0646)     Initial Impression / Assessment and Plan / ED Course  I have reviewed the triage vital signs and the nursing notes.  Pertinent labs & imaging results that were available during my care of the patient were reviewed by me and considered in my medical decision making (see chart for details).  Clinical Course     Patient with multiple visits for the same to the emergency department and urgent care. Here with headache. He has been diagnosed with chronic and tension  headaches. The pain he is describing is most consistent with TMJ or possible trigeminal neuralgia. Will treat with Toradol, will try Reglan and Benadryl, will reassess.  7:36 AM Feeling better. Wants to be discharged home. Will dc home with close outpatient follow up.   Vitals:   07/11/16 0518 07/11/16 0530 07/11/16 0545 07/11/16 0700  BP: 104/70 (!) 101/54 95/68 107/68  Pulse: (!) 50 (!) 47 64 (!) 56  Resp: 18     Temp: 97.7 F (36.5 C)     TempSrc: Oral     SpO2: 99% 99% 99% 99%  Weight:      Height:         Final Clinical Impressions(s) / ED Diagnoses   Final diagnoses:  Nonintractable headache, unspecified chronicity pattern, unspecified headache type    New Prescriptions New Prescriptions   No medications on file     Jaynie Crumbleatyana Riot Barrick, PA-C 07/11/16 0739    Dione Boozeavid Glick, MD 07/11/16 [id number]

## 2016-07-23 ENCOUNTER — Ambulatory Visit (HOSPITAL_COMMUNITY)
Admission: EM | Admit: 2016-07-23 | Discharge: 2016-07-23 | Disposition: A | Discharge status: Home / Self Care | Payer: Medicaid Other | Attending: Emergency Medicine | Admitting: Emergency Medicine

## 2016-07-23 ENCOUNTER — Telehealth: Payer: Self-pay | Admitting: Family Medicine

## 2016-07-23 ENCOUNTER — Encounter (HOSPITAL_COMMUNITY): Payer: Self-pay | Admitting: Family Medicine

## 2016-07-23 DIAGNOSIS — M26622 Arthralgia of left temporomandibular joint: Secondary | ICD-10-CM

## 2016-07-23 DIAGNOSIS — H9202 Otalgia, left ear: Secondary | ICD-10-CM

## 2016-07-23 MED ORDER — CYCLOBENZAPRINE HCL 10 MG PO TABS: 10.0000 mg | ORAL_TABLET | Freq: Every day | ORAL | 0 refills | Status: DC

## 2016-07-23 NOTE — Telephone Encounter (Signed)
Pt came into offfice with his interpreter. He is requesting a referral to get xrays for his teeth.  [facility] to [facility] but was told it would cost $430 for the xrays and medicaid would not pay for it.  Please advise

## 2016-07-23 NOTE — ED Provider Notes (Signed)
HPI  SUBJECTIVE:  Daniel Nelson is a 31 y.o. male who presents with persistent left otalgia and left-sided headache that has been present for months. Patient has been seen by multiple ED providers, his PMD, neurology, with otalgia and left-sided headaches. He has been tried on steroids, Tegretol, duloxetine, Flexeril, ibuprofen 800 mg. Most recently started on nortriptyline 25 mg by neurology,  indomethacin 50 mg when necessary pain, and Celexa 20 mg.  Last seen in the ED for a left-sided headache on 12/23. He was thought to have TMJ or possible trigeminal neuralgia.  Patient states that his pain today has not changed from his baseline. He is here requesting x-rays to evaluate his TMJ. Pt has a paper from an oral surgeon that has a CT of the jaws ordered. States that he cannot afford this.   He also notes that he is not taking any medicines, including the nortriptyline that neurology prescribed to him on 12/21, for his pain. He states that "nothing works." He states that he has not taken the nortriptyline because "it is meant for depression" . he states his symptoms are worse with chewing and opening his mouth. No alleviating factors.  PMD: Renold DonWALDEN,JEFF, MD   Past Medical History:  Diagnosis Date  . Headache   . Paresthesia     Past Surgical History:  Procedure Laterality Date  . APPENDECTOMY    . EYE SURGERY Right "as a young child"    Family History  Problem Relation Age of Onset  . Rheum arthritis Mother   . Healthy Father   . Colon cancer Neg Hx     Social History  Substance Use Topics  . Smoking status: Current Some Day Smoker    Types: Cigarettes  . Smokeless tobacco: Never Used     Comment: tobacco info given 01/07/16  . Alcohol use No    No current facility-administered medications for this encounter.   Current Outpatient Prescriptions:  .  citalopram (CELEXA) 20 MG tablet, Take 1 tablet (20 mg total) by mouth daily., Disp: 30 tablet, Rfl: 1 .   cyclobenzaprine (FLEXERIL) 10 MG tablet, Take 1 tablet (10 mg total) by mouth at bedtime., Disp: 20 tablet, Rfl: 0 .  indomethacin (INDOCIN) 50 MG capsule, Take 1 capsule (50 mg total) by mouth 2 (two) times daily as needed., Disp: 60 capsule, Rfl: 6 .  nortriptyline (PAMELOR) 25 MG capsule, Take 2 capsules (50 mg total) by mouth at bedtime., Disp: 60 capsule, Rfl: 11  Allergies  Allergen Reactions  . Dicyclomine Anxiety    Potential anxiety and GI distress?     ROS  As noted in HPI.   Physical Exam  BP 104/70   Pulse (!) 57   Resp 18   SpO2 98%   Constitutional: Well developed, well nourished, no acute distress Eyes:  EOMI, conjunctiva normal bilaterally HENT: Normocephalic, atraumatic,mucus membranes moist. left external ear nomal. Left external ear canal normal. Left TM normal. Positive crepitus over the left TMJ. No tenderness at the TMJ. Dentition unremarkable. Respiratory: Normal inspiratory effort Cardiovascular: Normal rate GI: nondistended skin: No rash, skin intact Musculoskeletal: no deformities Neurologic: Alert & oriented x 3, no focal neuro deficits Psychiatric: Speech and behavior appropriate   ED Course   Medications - No data to display  No orders of the defined types were placed in this encounter.   No results found for this or any previous visit (from the past 24 hour(s)). No results found.  ED Clinical Impression  Left ear  pain  Arthralgia of left temporomandibular joint   ED Assessment/Plan  Previous records extensively reviewed. As noted in history of present illness.   Presentation most consistent with TMJ arthralgia although he may have a component of trigeminal neuralgia with this as well given the distribution.  Called Dr. Dalbert Mayotte, oral surgeon's office. Pt needs a 3D cone beam XR, which we are not able to do here at this facility.  there is no acceptable plain film alternative.   Discussed with friend/translator and with  patient that nortriptyline is a useful pain medicine and is not meant just for depression. Also discussed with him that we are unable to do the imaging necessary for the oral surgeon to make adequate decisions. Discussed with them that he will need to either find another oral surgeon or discuss what else can be done with his current oral surgeon. We will advise for him to start the indomethicin, we can try Flexeril and a soft diet as well.  he will need to follow up with Dr. Fidela Salisbury, oral surgeon for further management. Also providing another referral for local oral surgeon. He may also follow-up with his PMD as needed.  Discussed MDM, plan and followup with patient and friend. they agree with plan.   Meds ordered this encounter  Medications  . cyclobenzaprine (FLEXERIL) 10 MG tablet    Sig: Take 1 tablet (10 mg total) by mouth at bedtime.    Dispense:  20 tablet    Refill:  0    *This clinic note was created using Scientist, clinical (histocompatibility and immunogenetics). Therefore, there may be occasional mistakes despite careful proofreading.  ?   Domenick Gong, MD 07/23/16 [id number]

## 2016-07-23 NOTE — Discharge Instructions (Signed)
Start taking the nortriptyline. This is a very helpful pain medicine. Also take the indomethacin. This is another pain medicine that you might find useful. Try the Flexeril which will help you to not going to teeth at night. Try a soft diet, such as  liquids or smoothies for the next several days and see if this makes your pain any better.  He can also try seeing Dr. Lucky CowboyKnox, oral [place] in [place]. He may want different imaging that you can afford/

## 2016-07-23 NOTE — ED Triage Notes (Addendum)
Pt sts that he is here for left jaw pain and ear and needs an x ray. sts the xray is needed for his dentist due to TMJ.

## 2016-07-27 ENCOUNTER — Telehealth: Payer: Self-pay | Admitting: *Deleted

## 2016-07-27 MED ORDER — TRAMADOL HCL 50 MG PO TABS: 50.0000 mg | ORAL_TABLET | Freq: Three times a day (TID) | ORAL | 0 refills | Status: DC | PRN

## 2016-07-27 NOTE — Telephone Encounter (Signed)
Patient walked in c/o left TMJ pain rating 5/10 at present as well as feeling unsteady with standing and walking and decrease vision in left eye. States these symptoms have been going on for 7 months but have increased in last 2 weeks. Also with 5 days of muscle pain and weakness. [facility] and prescribed flexeril. Patient states he did not know about this Rx and has not taken. States pain starts at TMJ and radiates to left eye, left eyebrow and left jaw. Has appt with PCP tomorrow at 1415. Requesting pain med for interim. Patient given script for Tramadol by PCP.    Spoke with patient via [employer] Video Interpreter, Salim ID [id number]  L. Leward Quanucatte, RN, BSN

## 2016-07-28 ENCOUNTER — Other Ambulatory Visit: Payer: Self-pay | Admitting: Family Medicine

## 2016-07-28 ENCOUNTER — Encounter: Payer: Self-pay | Admitting: Family Medicine

## 2016-07-28 ENCOUNTER — Ambulatory Visit (INDEPENDENT_AMBULATORY_CARE_PROVIDER_SITE_OTHER): Payer: Medicaid Other | Admitting: Family Medicine

## 2016-07-28 VITALS — BP 110/64 | HR 79 | Temp 98.0°F | Ht 73.0 in | Wt 155.4 lb

## 2016-07-28 DIAGNOSIS — E871 Hypo-osmolality and hyponatremia: Secondary | ICD-10-CM

## 2016-07-28 DIAGNOSIS — S0300XD Dislocation of jaw, unspecified side, subsequent encounter: Secondary | ICD-10-CM | POA: Diagnosis not present

## 2016-07-28 DIAGNOSIS — M26629 Arthralgia of temporomandibular joint, unspecified side: Secondary | ICD-10-CM

## 2016-07-28 LAB — COMPREHENSIVE METABOLIC PANEL
ALK PHOS: 34 U/L — AB (ref 40–115)
ALT: 9 U/L (ref 9–46)
AST: 11 U/L (ref 10–40)
Albumin: 4.5 g/dL (ref 3.6–5.1)
BILIRUBIN TOTAL: 0.6 mg/dL (ref 0.2–1.2)
BUN: 14 mg/dL (ref 7–25)
CO2: 27 mmol/L (ref 20–31)
Calcium: 9.4 mg/dL (ref 8.6–10.3)
Chloride: 102 mmol/L (ref 98–110)
Creat: 0.91 mg/dL (ref 0.60–1.35)
GLUCOSE: 67 mg/dL (ref 65–99)
POTASSIUM: 4.2 mmol/L (ref 3.5–5.3)
Sodium: 138 mmol/L (ref 135–146)
Total Protein: 7.7 g/dL (ref 6.1–8.1)

## 2016-07-28 LAB — CBC WITH DIFFERENTIAL/PLATELET
BASOS PCT: 0 %
Basophils Absolute: 0 cells/uL (ref 0–200)
EOS PCT: 1 %
Eosinophils Absolute: 81 cells/uL (ref 15–500)
HCT: 44.9 % (ref 38.5–50.0)
Hemoglobin: 15.3 g/dL (ref 13.2–17.1)
LYMPHS PCT: 39 %
Lymphs Abs: 3159 cells/uL (ref 850–3900)
MCH: 31.4 pg (ref 27.0–33.0)
MCHC: 34.1 g/dL (ref 32.0–36.0)
MCV: 92.2 fL (ref 80.0–100.0)
MONOS PCT: 5 %
MPV: 12.1 fL (ref 7.5–12.5)
Monocytes Absolute: 405 cells/uL (ref 200–950)
Neutro Abs: 4455 cells/uL (ref 1500–7800)
Neutrophils Relative %: 55 %
PLATELETS: 166 10*3/uL (ref 140–400)
RBC: 4.87 MIL/uL (ref 4.20–5.80)
RDW: 13.9 % (ref 11.0–15.0)
WBC: 8.1 10*3/uL (ref 3.8–10.8)

## 2016-07-28 LAB — TSH: TSH: 0.9 mIU/L (ref 0.40–4.50)

## 2016-07-28 LAB — VITAMIN B12: Vitamin B-12: 413 pg/mL (ref 200–1100)

## 2016-07-28 MED ORDER — POTASSIUM CHLORIDE CRYS ER 20 MEQ PO TBCR: 20.0000 meq | EXTENDED_RELEASE_TABLET | Freq: Every day | ORAL | 0 refills | Status: DC

## 2016-07-28 NOTE — Progress Notes (Signed)
Subjective:   [employer] interpreter Daniel Nelson [id number] used for today's visit:    Daniel Nelson is a 31 y.o. male who [facility] TMJ pain:  1.  TMJ pain:  Ongoing issue for patient. Still with pain most days. Describes alternately dull ache and sharp stabbing pain left TMJ. Worse when opening his mouth or talking or eating. Sometimes with some paresthesias which radiate from spots but usually just sharp stabbing pain. He has not yet tried taking the tramadol as he tries to get by with the past several days.  Not taking his nortriptyline as he has stigma against psychological medications and is concerned that no one is taking his pain seriously.  2. Body aches: Describes aches bilateral upper arms or legs. Some associated fatigue.  No URI symptoms. No fevers. Worse with movement. No polyuria or polydipsia. No change in medications or food or drink intake.   ROS as above per HPI.    The following portions of the patient's history were reviewed and updated as appropriate: allergies, current medications, past medical history, family and social history, and problem list. Patient is a nonsmoker.    PMH reviewed.  Past Medical History:  Diagnosis Date  . Headache   . Paresthesia    Past Surgical History:  Procedure Laterality Date  . APPENDECTOMY    . EYE SURGERY Right "as a young child"    Medications reviewed. Current Outpatient Prescriptions  Medication Sig Dispense Refill  . citalopram (CELEXA) 20 MG tablet Take 1 tablet (20 mg total) by mouth daily. 30 tablet 1  . cyclobenzaprine (FLEXERIL) 10 MG tablet Take 1 tablet (10 mg total) by mouth at bedtime. 20 tablet 0  . indomethacin (INDOCIN) 50 MG capsule Take 1 capsule (50 mg total) by mouth 2 (two) times daily as needed. 60 capsule 6  . nortriptyline (PAMELOR) 25 MG capsule Take 2 capsules (50 mg total) by mouth at bedtime. 60 capsule 11  . traMADol (ULTRAM) 50 MG tablet Take 1 tablet (50 mg total) by mouth every 8  (eight) hours as needed. 20 tablet 0   No current facility-administered medications for this visit.      Objective:   Physical Exam BP 110/64   Pulse 79   Temp 98 F (36.7 C) (Oral)   Ht 6\' 1"  (1.854 m)   Wt 155 lb 6.4 oz (70.5 kg)   SpO2 98%   BMI 20.50 kg/m  Gen:  Alert, cooperative patient who appears stated age in no acute distress.  Vital signs reviewed. Head: Decker/AT.   Eyes:  EOMI, PERRL.   Ears:  External ears WNL, Bilateral TM's normal without retraction, redness or bulging. Nose:  Septum midline  Mouth:  MMM, tonsils non-erythematous, non-edematous. Tender to palpation directly over left temporomandibular joint. Worse when he opens his mouth. No erythema here.  Neck:  No lymphadenopathy. Cardiac:  Regular rate and rhythm  Pulm:  Clear to auscultation bilaterally .   Extremities: No lower extremity edema. Musculoskeletal: No tenderness to palpation of muscles bilateral upper and lower extremities. Neuro: Alert and oriented 4. No cranial nerve deficits. No focal deficits.  No results found for this or any previous visit (from the past 72 hour(s)).   Impression/plan: 1. TMJ: -Persists. -He has not had much luck with otolaryngology thus far. -He has been followed by a dentist as well but has not been happy with his care there. He is asking for referral to another dentist for a second opinion. -Plan to  refer to [facility] for their orofacial pain group. I think they can be very helpful with treating   his ongoing and now chronic TMJ.  #2. Body aches: -Hyponatremia noted and borderline hypokalemia back in November. He has had episodes of hypokalemia in the past. -Possibly this is cramping pain from low electrolytes. -We're rechecking his electrolytes today. -Also going to send it for 7 days of low-dose potassium as this normal then this won't cause another issue. -We'll see if there is any issues with other findings on CMET

## 2016-07-28 NOTE — Progress Notes (Signed)
Daniel Nelson was here to see Dr. Gwendolyn GrantWalden and I asked to meet with him as well to followup about stress and somatic complaints. His mother was present during the visit.   Daniel Nelson explained that he has been doing a bit better recently and has been able to do more activities, such as walking, working, and driving, as long as someone is in the car with him. [facility] expressed that [facility] should keep trying to do these activities, explaining that staying active helps with pain.  Daniel Nelson still expressed a lot of concern about the pain in his left ear, which radiates through his jaw and eye. He stated that he is frustrated with his doctors for not telling him what is wrong with him, and feels that many doctors have jumped to the conclusion that depression is the source of his pain and has been told to take antidepressant medication. [facility] emphasized with patient that his doctors know that his pain is real and are working to find the cause and effective treatment. [facility] stated that doctors are likely talking with him about depression/anxiety/stress because having ongoing and unexplained pain can be a source of great stress. [facility] also explained that stress can worsen pain and offered to work with patient on managing stress. Patient expressed understanding and [facility].

## 2016-07-28 NOTE — Patient Instructions (Signed)
It was good to see you today.  We are checking your sodium and potassium today. I have sent in potassium for you to take one a day for the next 7 days.  We are also checking your liver.  [facility] and [facility].  Come back and see me in about one month.

## 2016-07-28 NOTE — Telephone Encounter (Signed)
Saw today in clinic.  See today's OV note for details.

## 2016-07-29 ENCOUNTER — Encounter: Payer: Self-pay | Admitting: Family Medicine

## 2016-07-30 ENCOUNTER — Telehealth: Payer: Self-pay | Admitting: Family Medicine

## 2016-07-30 NOTE — Telephone Encounter (Signed)
Please call and let him know all of his labs look great.  This is very good news.  We were specifically tested to make sure his hyponatremia and hypokalemia had resolved. These are both resolved. She continued to take the potassium until he is finished with this in a week. He only needs 7 days of the potassium if he asks.  He should wait to hear from us about the referral to the ENT/dentist for his TMJ.

## 2016-07-30 NOTE — Telephone Encounter (Signed)
Patient came in today  to request lab results stated labs were done 07/28/2016 Please call with interpreter  Please follow up

## 2016-08-01 ENCOUNTER — Encounter (HOSPITAL_COMMUNITY): Payer: Self-pay | Admitting: Emergency Medicine

## 2016-08-01 ENCOUNTER — Ambulatory Visit (HOSPITAL_COMMUNITY)
Admission: EM | Admit: 2016-08-01 | Discharge: 2016-08-01 | Disposition: A | Discharge status: Home / Self Care | Payer: Medicaid Other | Attending: Internal Medicine | Admitting: Internal Medicine

## 2016-08-01 DIAGNOSIS — R1084 Generalized abdominal pain: Secondary | ICD-10-CM

## 2016-08-01 DIAGNOSIS — R110 Nausea: Secondary | ICD-10-CM

## 2016-08-01 MED ORDER — ONDANSETRON HCL 4 MG PO TABS: 4.0000 mg | ORAL_TABLET | Freq: Three times a day (TID) | ORAL | 0 refills | Status: DC | PRN

## 2016-08-01 NOTE — ED Triage Notes (Signed)
Symptoms started today of nausea, abdominal pain, dizziness and left ear with decreased hearing .

## 2016-08-01 NOTE — Discharge Instructions (Signed)
Please keep regular f/u with your PCP, Dr. Gwendolyn GrantWalden for these chronic issues. Happy to provide something for nausea, but you really need to keep f/u so that you can get taken care of. Most recent labs are normal.

## 2016-08-01 NOTE — ED Provider Notes (Signed)
CSN: [id number]     Arrival date & time 08/01/16  1634 History   First MD Initiated Contact with Patient 08/01/16 1727     Chief Complaint  Patient presents with  . Abdominal Pain   (Consider location/radiation/quality/duration/timing/severity/associated sxs/prior Treatment)   31 yo presents with 7 months of abdominal discomfort and nausea. He also has chronic dizziness and TMJ that he has been seen for multiple times. He is seeing his PCP and specialist for these complaints but comes here for results of her lab work and further suggestions. He has a referral to ENT in place. He has no fevers, no new symtpoms. See ROS.        Past Medical History:  Diagnosis Date  . Headache   . Paresthesia    Past Surgical History:  Procedure Laterality Date  . APPENDECTOMY    . EYE SURGERY Right "as a Diva Lemberger child"   Family History  Problem Relation Age of Onset  . Rheum arthritis Mother   . Healthy Father   . Colon cancer Neg Hx    Social History  Substance Use Topics  . Smoking status: Current Some Day Smoker    Types: Cigarettes  . Smokeless tobacco: Never Used     Comment: tobacco info given 01/07/16  . Alcohol use No    Review of Systems  Constitutional: Negative.   HENT: Positive for ear pain.   Gastrointestinal: Positive for abdominal pain and nausea.  Genitourinary: Negative.   Skin: Negative.   Neurological: Positive for dizziness.  Psychiatric/Behavioral: Negative.     Allergies  Dicyclomine  Home Medications   Prior to Admission medications   Medication Sig Start Date End Date Taking? Authorizing Provider  citalopram (CELEXA) 20 MG tablet Take 1 tablet (20 mg total) by mouth daily. Patient not taking: Reported on 08/01/2016 06/09/16   Tobey GrimJeffrey H Walden, MD  cyclobenzaprine (FLEXERIL) 10 MG tablet Take 1 tablet (10 mg total) by mouth at bedtime. Patient not taking: Reported on 08/01/2016 07/23/16   Domenick GongAshley Mortenson, MD  indomethacin (INDOCIN) 50 MG capsule Take 1  capsule (50 mg total) by mouth 2 (two) times daily as needed. Patient not taking: Reported on 08/01/2016 07/09/16   Levert FeinsteinYijun Yan, MD  nortriptyline (PAMELOR) 25 MG capsule Take 2 capsules (50 mg total) by mouth at bedtime. Patient not taking: Reported on 08/01/2016 07/09/16   Levert FeinsteinYijun Yan, MD  ondansetron (ZOFRAN) 4 MG tablet Take 1 tablet (4 mg total) by mouth every 8 (eight) hours as needed for nausea or vomiting. 08/01/16   Riki SheerMichelle G Maisha Bogen, PA-C  potassium chloride SA (K-DUR,KLOR-CON) 20 MEQ tablet Take 1 tablet (20 mEq total) by mouth daily. X 7 days Patient not taking: Reported on 08/01/2016 07/28/16   Tobey GrimJeffrey H Walden, MD  traMADol (ULTRAM) 50 MG tablet Take 1 tablet (50 mg total) by mouth every 8 (eight) hours as needed. Patient not taking: Reported on 08/01/2016 07/27/16   Tobey GrimJeffrey H Walden, MD   Meds Ordered and Administered this Visit  Medications - No data to display  BP 121/83 (BP Location: Right Arm)   Pulse 64   Temp 97.9 F (36.6 C) (Oral)   Resp 16   SpO2 100%  No data found.   Physical Exam  Constitutional: He is oriented to person, place, and time. He appears well-developed and well-nourished. No distress.  Cardiovascular: Normal rate and regular rhythm.   Pulmonary/Chest: Effort normal and breath sounds normal.  Abdominal: Soft. Bowel sounds are normal. He exhibits no distension  and no mass. There is no tenderness. There is no rebound and no guarding.  Neurological: He is alert and oriented to person, place, and time.  Skin: Skin is warm and dry. He is not diaphoretic.  Psychiatric: His behavior is normal.  Nursing note and vitals reviewed.   Urgent Care Course   Clinical Course     Procedures (including critical care time)  Labs Review Labs Reviewed - No data to display  Imaging Review No results found.   Visual Acuity Review  Right Eye Distance:   Left Eye Distance:   Bilateral Distance:    Right Eye Near:   Left Eye Near:    Bilateral Near:          MDM   1. Generalized abdominal pain   2. Nausea    Interpreter used today. Unfortunately I have nothing else to offer him new at this point. His labs are reviewed from 01/11 and are normal. His reports say that a referral is in place. His abdominal exam is normal. I tried to explain our role here at Urgent care and that he needs to keep f/u with his PCP so that appropriate long term care can be given. He seems to understand. I provided Zofran for nausea today. He is stable and can be discharged.     Riki Sheer, PA-C 08/01/16 1845

## 2016-08-03 NOTE — Telephone Encounter (Signed)
Contacted pt via [employer] Name-Maikel [phone] [id number] and informed pt of the information below.  He did say that he had not heard from anyone about the referral and told him someone should be in touch with him soon. Lamonte SakaiZimmerman Rumple, April D, [place]

## 2016-08-04 NOTE — Telephone Encounter (Signed)
I actually just called their office. The referral has been received and has been sent to the "[facility]", because the faculty group does not accept Medicaid. They will contact patient with interpreter to schedule sometime soon, hopefully.

## 2016-08-09 ENCOUNTER — Encounter (HOSPITAL_COMMUNITY): Payer: Self-pay | Admitting: Emergency Medicine

## 2016-08-09 ENCOUNTER — Emergency Department (HOSPITAL_COMMUNITY)
Admission: EM | Admit: 2016-08-09 | Discharge: 2016-08-09 | Disposition: A | Discharge status: Home / Self Care | Payer: Medicaid Other | Attending: Emergency Medicine | Admitting: Emergency Medicine

## 2016-08-09 DIAGNOSIS — F17210 Nicotine dependence, cigarettes, uncomplicated: Secondary | ICD-10-CM | POA: Insufficient documentation

## 2016-08-09 DIAGNOSIS — R1013 Epigastric pain: Secondary | ICD-10-CM | POA: Diagnosis present

## 2016-08-09 LAB — CBC
HEMATOCRIT: 41.3 % (ref 39.0–52.0)
HEMOGLOBIN: 14.3 g/dL (ref 13.0–17.0)
MCH: 31.4 pg (ref 26.0–34.0)
MCHC: 34.6 g/dL (ref 30.0–36.0)
MCV: 90.8 fL (ref 78.0–100.0)
Platelets: 171 10*3/uL (ref 150–400)
RBC: 4.55 MIL/uL (ref 4.22–5.81)
RDW: 13 % (ref 11.5–15.5)
WBC: 8.9 10*3/uL (ref 4.0–10.5)

## 2016-08-09 LAB — URINALYSIS, ROUTINE W REFLEX MICROSCOPIC
BILIRUBIN URINE: NEGATIVE
GLUCOSE, UA: NEGATIVE mg/dL
HGB URINE DIPSTICK: NEGATIVE
Ketones, ur: NEGATIVE mg/dL
Leukocytes, UA: NEGATIVE
Nitrite: NEGATIVE
PH: 6 (ref 5.0–8.0)
Protein, ur: NEGATIVE mg/dL
Specific Gravity, Urine: 1.024 (ref 1.005–1.030)

## 2016-08-09 LAB — COMPREHENSIVE METABOLIC PANEL
ALBUMIN: 4.4 g/dL (ref 3.5–5.0)
ALT: 13 U/L — ABNORMAL LOW (ref 17–63)
AST: 15 U/L (ref 15–41)
Alkaline Phosphatase: 43 U/L (ref 38–126)
Anion gap: 7 (ref 5–15)
BUN: 13 mg/dL (ref 6–20)
CO2: 24 mmol/L (ref 22–32)
Calcium: 9.1 mg/dL (ref 8.9–10.3)
Chloride: 107 mmol/L (ref 101–111)
Creatinine, Ser: 0.75 mg/dL (ref 0.61–1.24)
GFR calc Af Amer: >60 mL/min (ref >60)
GFR calc non Af Amer: >60 mL/min (ref >60)
GLUCOSE: 92 mg/dL (ref 65–99)
POTASSIUM: 3.7 mmol/L (ref 3.5–5.1)
SODIUM: 138 mmol/L (ref 135–145)
Total Bilirubin: 0.5 mg/dL (ref 0.3–1.2)
Total Protein: 7.2 g/dL (ref 6.5–8.1)

## 2016-08-09 LAB — LIPASE, BLOOD: Lipase: 21 U/L (ref 11–51)

## 2016-08-09 MED ORDER — GI COCKTAIL ~~LOC~~
30.0000 mL | Freq: Once | ORAL | Status: AC
Start: 2016-08-09 — End: 2016-08-09
  Administered 2016-08-09: 30 mL via ORAL
  Filled 2016-08-09: qty 30

## 2016-08-09 MED ORDER — OMEPRAZOLE 20 MG PO CPDR: 20.0000 mg | DELAYED_RELEASE_CAPSULE | Freq: Every day | ORAL | 0 refills | Status: DC

## 2016-08-09 NOTE — ED Provider Notes (Signed)
[facility]EM[facility]ENCY DEPT Provider Note   CSN: [id number] Arrival date & time: 08/09/16  0458     History   Chief Complaint Chief Complaint  Patient presents with  . Abdominal Pain    HPI Daniel Nelson is a 31 y.o. male presents with worsening abdominal pain/cramping since 5 am today. He describes his pain as cramping, mid epigastric, and now constant. He states it was first 10/10 when he came in the ED and now a 7/10. He states he has not taken anything for his pain. He reports associated nausea and mild dizziness. He reports he was eating chocolate when his symptoms started. He reports having had abdominal discomfort for several months and has been seen in the ED for it. He reports also having headache and jaw pain that has been constant for 7 months. He states he has been seen in the ED, his primary, and oral surgeon for it several times including 2 recent times in ED where he was seen. Oral surgeon thinks this may be TMJ and needed a CT done but patient not able to do it. Patient states his symptoms are the exact same and not any different than his usual headache and jaw pain. He was prescribed medication for his symptoms by oral surgeon and has not taken any. He is requesting to be referred to another oral surgeon because he did not have a good experience with the one he saw. He denies any trauma, chest pain, SOB, fevers, chills, vomiting, dysuria, changes in bowel movements.   The history is provided by the patient. The history is limited by a language barrier. A language interpreter was used (Arabic).  Abdominal Pain   Associated symptoms include nausea and headaches. Pertinent negatives include fever, diarrhea, vomiting and dysuria.    Past Medical History:  Diagnosis Date  . Headache   . Paresthesia     Patient Active Problem List   Diagnosis Date Noted  . Tension-type headache, not intractable 07/09/2016  . Anxiety state 06/10/2016  . Deviated septum 03/27/2016  .  Paresthesias 02/28/2016  . Chronic headaches 01/24/2016  . Epigastric pain 01/24/2016  . Myalgia 11/22/2015  . Seasonal allergies 11/11/2015  . H. pylori infection 08/17/2015  . Refugee health examination 08/01/2015  . Otalgia 08/01/2015  . H/O eye surgery 08/01/2015    Past Surgical History:  Procedure Laterality Date  . APPENDECTOMY    . EYE SURGERY Right "as a young child"       Home Medications    Prior to Admission medications   Medication Sig Start Date End Date Taking? Authorizing Provider  citalopram (CELEXA) 20 MG tablet Take 1 tablet (20 mg total) by mouth daily. Patient not taking: Reported on 08/01/2016 06/09/16   Tobey Grim, MD  cyclobenzaprine (FLEXERIL) 10 MG tablet Take 1 tablet (10 mg total) by mouth at bedtime. Patient not taking: Reported on 08/01/2016 07/23/16   Domenick Gong, MD  indomethacin (INDOCIN) 50 MG capsule Take 1 capsule (50 mg total) by mouth 2 (two) times daily as needed. Patient not taking: Reported on 08/01/2016 07/09/16   Levert Feinstein, MD  nortriptyline (PAMELOR) 25 MG capsule Take 2 capsules (50 mg total) by mouth at bedtime. Patient not taking: Reported on 08/01/2016 07/09/16   Levert Feinstein, MD  ondansetron (ZOFRAN) 4 MG tablet Take 1 tablet (4 mg total) by mouth every 8 (eight) hours as needed for nausea or vomiting. 08/01/16   Riki Sheer, PA-C  potassium chloride SA (K-DUR,KLOR-CON) 20 MEQ tablet  Take 1 tablet (20 mEq total) by mouth daily. X 7 days Patient not taking: Reported on 08/01/2016 07/28/16   Tobey Grim, MD  traMADol (ULTRAM) 50 MG tablet Take 1 tablet (50 mg total) by mouth every 8 (eight) hours as needed. Patient not taking: Reported on 08/01/2016 07/27/16   Tobey Grim, MD    Family History Family History  Problem Relation Age of Onset  . Rheum arthritis Mother   . Healthy Father   . Colon cancer Neg Hx     Social History Social History  Substance Use Topics  . Smoking status: Current Some Day Smoker     Types: Cigarettes  . Smokeless tobacco: Never Used     Comment: tobacco info given 01/07/16  . Alcohol use No     Allergies   Dicyclomine   Review of Systems Review of Systems  Constitutional: Negative for chills and fever.  HENT: Positive for ear pain. Negative for facial swelling, rhinorrhea and trouble swallowing.   Eyes: Negative for visual disturbance.       Pain behind eye secondary to headache.  Respiratory: Negative for shortness of breath and wheezing.   Cardiovascular: Negative for chest pain.  Gastrointestinal: Positive for abdominal pain and nausea. Negative for diarrhea and vomiting.  Genitourinary: Negative for difficulty urinating and dysuria.  Musculoskeletal: Negative for neck pain and neck stiffness.  Skin: Negative for rash and wound.  Neurological: Positive for dizziness and headaches. Negative for speech difficulty.     Physical Exam Updated Vital Signs BP 106/76 (BP Location: Right Arm)   Pulse 78   Temp 97.7 F (36.5 C) (Oral)   Resp 17   SpO2 100%   Physical Exam  Constitutional: He is oriented to person, place, and time. He appears well-developed and well-nourished.  Well appearing  HENT:  Head: Normocephalic and atraumatic.  Nose: Nose normal.  Mouth/Throat: Oropharynx is clear and moist.  No obvious wound, redness, swelling or deformity on head or scalp.   Eyes: Conjunctivae and EOM are normal. Pupils are equal, round, and reactive to light.  Neck: Normal range of motion. Neck supple.  Cardiovascular: Normal rate and normal heart sounds.   Pulmonary/Chest: Effort normal and breath sounds normal. No respiratory distress. He exhibits no tenderness.  Abdominal: Soft. Bowel sounds are normal. There is tenderness in the epigastric area. There is no rebound, no guarding, no tenderness at McBurney's point and negative Murphy's sign.  Musculoskeletal: Normal range of motion.  Neurological: He is alert and oriented to person, place, and time.    Cranial Nerves:  III,IV, VI: ptosis not present, extra-ocular movements intact bilaterally, direct and consensual pupillary light reflexes intact bilaterally V: facial sensation, jaw opening, and bite strength equal bilaterally VII: eyebrow raise, eyelid close, smile, frown, pucker equal bilaterally VIII: hearing grossly normal bilaterally  IX,X: palate elevation and swallowing intact XI: bilateral shoulder shrug and lateral head rotation equal and strong XII: midline tongue extension  Cerebellar tests negative.  Sensory intact.  Muscle strength 5/5 Patient able to ambulate without difficulty.   Skin: Skin is warm. Capillary refill takes less than 2 seconds.  Psychiatric: He has a normal mood and affect. His behavior is normal.  Nursing note and vitals reviewed.    ED Treatments / Results  Labs (all labs ordered are listed, but only abnormal results are displayed) Labs Reviewed  COMPREHENSIVE METABOLIC PANEL - Abnormal; Notable for the following:       Result Value   ALT 13 (*)  All other components within normal limits  LIPASE, BLOOD  CBC  URINALYSIS, ROUTINE W REFLEX MICROSCOPIC    EKG  EKG Interpretation None       Radiology No results found.  Procedures Procedures (including critical care time)  Medications Ordered in ED Medications  gi cocktail (Maalox,Lidocaine,Donnatal) (30 mLs Oral Given 08/09/16 [id number])     Initial Impression / Assessment and Plan / ED Course  I have reviewed the triage vital signs and the nursing notes.  Pertinent labs & imaging results that were available during my care of the patient were reviewed by me and considered in my medical decision making (see chart for details).    Patient is a 31 yo male presents with abdominal pain x since 5am. On exam, nontoxic, nonseptic appearing, in no apparent distress. Patient's pain and other symptoms adequately managed in emergency department.  Labs are reassuring, and vitals reviewed.  Patient  does not meet the SIRS or Sepsis criteria.  On repeat exam patient does not have a surgical abdomen and there are no peritoneal signs. No focal tenderness at McBurney's point and negative Murphy's sign. No indication of appendicitis, bowel obstruction, bowel perforation, cholecystitis, diverticulitis.  Headache similar to previous, no fever, neck stiffness, neuro findings or new sxs to suggest more serious etiology.  I don't think SAH, ICH, meningitis, encephalitis, mass at this time. No recent trauma. No tenderness to temporal areas bilaterally. I don't feel imaging necessary at this time. Plan to control symptoms.  Patient given GI cocktail and relieved his pain completely.  Patient discharged home with symptomatic treatment and given strict instructions for follow-up with a new oral surgeon and primary care physician. Pt states that he wanted a referral to a new oral surgeon because he did not have a good experience with the one he saw. I have also discussed reasons to return immediately to the ER.  Patient expresses understanding and agrees with plan.  Final Clinical Impressions(s) / ED Diagnoses   Final diagnoses:  Epigastric pain    New Prescriptions New Prescriptions   No medications on file     [address] CourtFrancisco Manuel LongtownEspina, [place]PA 08/09/16 2023    Geoffery Lyonsouglas Delo, MD 08/10/16 [phone]

## 2016-08-09 NOTE — ED Triage Notes (Signed)
Patient reports mid abdominal cramping onset this morning with nausea and mild dizziness , Arabic interpreter service utilized at triage , denies emesis or diarrhea , no fever or chills .

## 2016-08-09 NOTE — Discharge Instructions (Addendum)
Please take omeprazole everyday 30 minutes before meals. Please schedule appointment with primary care physician in 2-5 days for follow up to today's visit. Please schedule appointment with oral surgeon in 2-5 days for follow up. Please avoid foods that may agitate symptoms.  Get help right away if: Your pain does not go away as soon as your health care provider told you to expect. You cannot stop throwing up. Your pain is only in areas of the abdomen, such as the right side or the left lower portion of the abdomen. You have bloody or black stools, or stools that look like tar. You have severe pain, cramping, or bloating in your abdomen. You have signs of dehydration, such as: Dark urine, very little urine, or no urine. Cracked lips. Dry mouth. Sunken eyes. Sleepiness. Weakness.

## 2016-08-09 NOTE — ED Notes (Signed)
PA at the bedside.

## 2016-08-11 ENCOUNTER — Ambulatory Visit: Payer: Medicaid Other

## 2016-08-12 ENCOUNTER — Telehealth: Payer: Self-pay | Admitting: Family Medicine

## 2016-08-12 NOTE — Telephone Encounter (Signed)
[facility] because [facility]. Pt stated they were told [facility] might accept pt's insurance. ep

## 2016-08-13 NOTE — Telephone Encounter (Signed)
Tia, do you know if [facility]?  If so, I'll put in a referral.

## 2016-08-13 NOTE — Telephone Encounter (Signed)
I am not sure. I will have to research this. I will not need a new referral placed. I can use the one that is active.

## 2016-08-14 NOTE — Telephone Encounter (Signed)
[facility] if that see patients with MCD. Waiting a return call.

## 2016-08-17 NOTE — Telephone Encounter (Signed)
[facility] DOES NOT see patients with Medicaid. Please advise where you want this referral sent. Thanks.

## 2016-08-23 ENCOUNTER — Encounter (HOSPITAL_COMMUNITY): Payer: Self-pay

## 2016-08-23 ENCOUNTER — Emergency Department (HOSPITAL_COMMUNITY)
Admission: EM | Admit: 2016-08-23 | Discharge: 2016-08-23 | Disposition: A | Discharge status: Home / Self Care | Payer: Medicaid Other | Attending: Emergency Medicine | Admitting: Emergency Medicine

## 2016-08-23 DIAGNOSIS — R51 Headache: Secondary | ICD-10-CM | POA: Insufficient documentation

## 2016-08-23 DIAGNOSIS — F17210 Nicotine dependence, cigarettes, uncomplicated: Secondary | ICD-10-CM | POA: Insufficient documentation

## 2016-08-23 DIAGNOSIS — Z79899 Other long term (current) drug therapy: Secondary | ICD-10-CM | POA: Diagnosis not present

## 2016-08-23 DIAGNOSIS — R519 Headache, unspecified: Secondary | ICD-10-CM

## 2016-08-23 DIAGNOSIS — H9202 Otalgia, left ear: Secondary | ICD-10-CM | POA: Diagnosis not present

## 2016-08-23 NOTE — ED Triage Notes (Signed)
Pt complains of headache, blurred vision, chills and dry mouth x 2 days. Pt afebrile in triage. Pt states that he took tramadol at 1500 this evening with relief but it came back. No neuro deficits.

## 2016-08-23 NOTE — ED Notes (Signed)
Lobby updated on delays in rooming and warm blankets offered 

## 2016-08-23 NOTE — ED Provider Notes (Signed)
[facility] Provider Note   CSN: [id number] Arrival date & time: 08/23/16  0043     History   Chief Complaint Chief Complaint  Patient presents with  . Dizziness    HPI Daniel Nelson is a 31 y.o. male.  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 He complains of headache, feeling off balance while walking, blurred vision. No N, V, fever. He reports symptoms are near constant and without alleviating or aggravating factors. Symptoms started in May of 2017. He states he has seen many doctors but they are not able to find out what is wrong. He states he cannot hold a job because symptoms make it impossible for him to concentrate on any task. There has been no change over the course of the last 8 months since it started.    The history is provided by the patient. A language interpreter was used (Phone interpreter was used).  Dizziness  Associated symptoms: headaches   Associated symptoms: no chest pain, no nausea, no shortness of breath and no vomiting     Past Medical History:  Diagnosis Date  . Headache   . Paresthesia     Patient Active Problem List   Diagnosis Date Noted  . Tension-type headache, not intractable 07/09/2016  . Anxiety state 06/10/2016  . Deviated septum 03/27/2016  . Paresthesias 02/28/2016  . Chronic headaches 01/24/2016  . Epigastric pain 01/24/2016  . Myalgia 11/22/2015  . Seasonal allergies 11/11/2015  . H. pylori infection 08/17/2015  . Refugee health examination 08/01/2015  . Otalgia 08/01/2015  . H/O eye surgery 08/01/2015    Past Surgical History:  Procedure Laterality Date  . APPENDECTOMY    . EYE SURGERY Right "as a young child"       Home Medications    Prior to Admission medications   Medication Sig Start Date End Date Taking? Authorizing Provider  cyclobenzaprine (FLEXERIL) 10 MG tablet Take 1 tablet (10 mg total) by mouth at bedtime. Patient not taking:  Reported on 08/01/2016 07/23/16   Domenick Gong, MD  indomethacin (INDOCIN) 50 MG capsule Take 1 capsule (50 mg total) by mouth 2 (two) times daily as needed. Patient not taking: Reported on 08/01/2016 07/09/16   Levert Feinstein, MD  nortriptyline (PAMELOR) 25 MG capsule Take 2 capsules (50 mg total) by mouth at bedtime. Patient not taking: Reported on 08/01/2016 07/09/16   Levert Feinstein, MD  omeprazole (PRILOSEC) 20 MG capsule Take 1 capsule (20 mg total) by mouth daily. 08/09/16   Francisco Manuel Espina, PA  ondansetron (ZOFRAN) 4 MG tablet Take 1 tablet (4 mg total) by mouth every 8 (eight) hours as needed for nausea or vomiting. 08/01/16   Riki Sheer, PA-C  potassium chloride SA (K-DUR,KLOR-CON) 20 MEQ tablet Take 1 tablet (20 mEq total) by mouth daily. X 7 days Patient not taking: Reported on 08/01/2016 07/28/16   Tobey Grim, MD  traMADol (ULTRAM) 50 MG tablet Take 1 tablet (50 mg total) by mouth every 8 (eight) hours as needed. Patient not taking: Reported on 08/01/2016 07/27/16   Tobey Grim, MD    Family History Family History  Problem Relation Age of Onset  . Rheum arthritis Mother   . Healthy Father   . Colon cancer Neg Hx     Social History Social History  Substance Use Topics  . Smoking status: Current Some Day Smoker    Types: Cigarettes  .  Smokeless tobacco: Never Used     Comment: tobacco info given 01/07/16  . Alcohol use No     Allergies   Dicyclomine   Review of Systems Review of Systems  Constitutional: Negative for chills, fever and unexpected weight change.  HENT: Positive for ear pain.   Eyes: Positive for visual disturbance.  Respiratory: Negative.  Negative for cough and shortness of breath.   Cardiovascular: Negative.  Negative for chest pain.  Gastrointestinal: Negative.  Negative for abdominal pain, nausea and vomiting.  Musculoskeletal: Negative.   Skin: Negative.   Neurological: Positive for dizziness and headaches.     Physical  Exam Updated Vital Signs BP 107/59   Pulse (!) 56   Temp 97.8 F (36.6 C) (Oral)   Resp 20   SpO2 97%   Physical Exam  Constitutional: He is oriented to person, place, and time. He appears well-developed and well-nourished.  HENT:  Head: Normocephalic.  Eyes: Pupils are equal, round, and reactive to light.  Neck: Normal range of motion. Neck supple.  Cardiovascular: Normal rate and regular rhythm.   No murmur heard. NO carotid bruit.   Pulmonary/Chest: Effort normal and breath sounds normal. He has no wheezes. He has no rales.  Abdominal: Soft. Bowel sounds are normal. There is no tenderness. There is no rebound and no guarding.  Musculoskeletal: Normal range of motion.  Neurological: He is alert and oriented to person, place, and time. No sensory deficit.  CN's 3-12 grossly intact. No deficits of coordination. Negative pronator drift and romberg. No facial asymmetry. Speech is clear, focused and oriented. Reflexes are normoreflexic and symmetric.  Skin: Skin is warm and dry. No rash noted.  Psychiatric: He has a normal mood and affect.     ED Treatments / Results  Labs (all labs ordered are listed, but only abnormal results are displayed) Labs Reviewed - No data to display  EKG  EKG Interpretation None       Radiology No results found.  Procedures Procedures (including critical care time)  Medications Ordered in ED Medications - No data to display   Initial Impression / Assessment and Plan / ED Course  I have reviewed the triage vital signs and the nursing notes.  Pertinent labs & imaging results that were available during my care of the patient were reviewed by me and considered in my medical decision making (see chart for details).     Chart reviewed. The patient is a family practice patient and notes are reviewed. The patient has been seen by counseling, by neurology and by family medicine multiple times. MRI brain and cervical spine in 01/2016 are  negative. CT head also negative. He has been found to have an elevated ANA and a vitamin B deficiency and has been seen by rheumatology.   It has not been decided if the patient's symptoms are somatic or psychologic, though, both have been considered. Clearly, with nothing changed in symptoms, with multiple outpatient evaluations, participation in coordinating care through regular contact with PCP, specialist involvement, there is nothing more we can provide in the emergency setting. He has a normal neurologic exam, VSS, he appears comfortable and declines any comfort medications here.   He is felt appropriate for discharge home with PCP follow up to continue coordination of care.   Final Clinical Impressions(s) / ED Diagnoses   Final diagnoses:  None   1. Headache 2. Otalgia   New Prescriptions New Prescriptions   No medications on file     [facility]  Hector ShadeUpstill, PA-C 08/23/16 0724    Elpidio AnisShari Adin Lariccia, PA-C 08/23/16 [id number]    Gilda Creasehristopher J Pollina, MD 08/23/16 [phone]

## 2016-08-25 NOTE — Telephone Encounter (Signed)
I will discuss with him at his next visit and develop a plan.  Thanks, JW

## 2016-08-30 ENCOUNTER — Encounter (HOSPITAL_COMMUNITY): Payer: Self-pay | Admitting: Emergency Medicine

## 2016-08-30 ENCOUNTER — Emergency Department (HOSPITAL_COMMUNITY)
Admission: EM | Admit: 2016-08-30 | Discharge: 2016-09-01 | Disposition: A | Discharge status: Home / Self Care | Payer: Medicaid Other | Attending: Emergency Medicine | Admitting: Emergency Medicine

## 2016-08-30 DIAGNOSIS — F17210 Nicotine dependence, cigarettes, uncomplicated: Secondary | ICD-10-CM | POA: Diagnosis not present

## 2016-08-30 DIAGNOSIS — R42 Dizziness and giddiness: Secondary | ICD-10-CM | POA: Insufficient documentation

## 2016-08-30 DIAGNOSIS — R51 Headache: Secondary | ICD-10-CM | POA: Insufficient documentation

## 2016-08-30 DIAGNOSIS — Z5321 Procedure and treatment not carried out due to patient leaving prior to being seen by health care provider: Secondary | ICD-10-CM | POA: Diagnosis not present

## 2016-08-30 LAB — BASIC METABOLIC PANEL
Anion gap: 9 (ref 5–15)
BUN: 10 mg/dL (ref 6–20)
CHLORIDE: 104 mmol/L (ref 101–111)
CO2: 25 mmol/L (ref 22–32)
CREATININE: 0.99 mg/dL (ref 0.61–1.24)
Calcium: 9.3 mg/dL (ref 8.9–10.3)
Glucose, Bld: 97 mg/dL (ref 65–99)
POTASSIUM: 3.7 mmol/L (ref 3.5–5.1)
SODIUM: 138 mmol/L (ref 135–145)

## 2016-08-30 LAB — CBC
HEMATOCRIT: 43.1 % (ref 39.0–52.0)
Hemoglobin: 14.8 g/dL (ref 13.0–17.0)
MCH: 31.5 pg (ref 26.0–34.0)
MCHC: 34.3 g/dL (ref 30.0–36.0)
MCV: 91.7 fL (ref 78.0–100.0)
PLATELETS: 166 10*3/uL (ref 150–400)
RBC: 4.7 MIL/uL (ref 4.22–5.81)
RDW: 12.9 % (ref 11.5–15.5)
WBC: 10.3 10*3/uL (ref 4.0–10.5)

## 2016-08-30 LAB — CBG MONITORING, ED: Glucose-Capillary: 104 mg/dL — ABNORMAL HIGH (ref 65–99)

## 2016-08-30 NOTE — ED Notes (Signed)
Pt stated he couldn't wait any longer he had to be at work.

## 2016-08-30 NOTE — ED Triage Notes (Addendum)
Pt arrives from home with c/o headaches and dizziness "for a long time." States in the last three days, reports dizziness has become more severe. Reports room spinning and falls because of this. States headache located in left side usually, but now moved to entire head. States numbness and burning to head. Ambulatory at a steady gait, no neuro deficits in triage. Tramadol not effective for pain.  [facility].

## 2016-09-16 ENCOUNTER — Ambulatory Visit (INDEPENDENT_AMBULATORY_CARE_PROVIDER_SITE_OTHER): Payer: Self-pay | Admitting: Student

## 2016-09-16 ENCOUNTER — Telehealth: Payer: Self-pay | Admitting: Student

## 2016-09-16 ENCOUNTER — Encounter: Payer: Self-pay | Admitting: Student

## 2016-09-16 ENCOUNTER — Ambulatory Visit (INDEPENDENT_AMBULATORY_CARE_PROVIDER_SITE_OTHER): Payer: Medicaid Other | Admitting: Student

## 2016-09-16 ENCOUNTER — Encounter: Payer: Self-pay | Admitting: Family Medicine

## 2016-09-16 VITALS — BP 110/68 | HR 61 | Temp 98.2°F | Ht 73.0 in | Wt 159.2 lb

## 2016-09-16 DIAGNOSIS — R519 Headache, unspecified: Secondary | ICD-10-CM

## 2016-09-16 DIAGNOSIS — R51 Headache: Secondary | ICD-10-CM | POA: Diagnosis not present

## 2016-09-16 MED ORDER — DULOXETINE HCL 60 MG PO CPEP: 60.0000 mg | ORAL_CAPSULE | Freq: Every day | ORAL | 3 refills | Status: DC

## 2016-09-16 MED ORDER — TRAMADOL HCL 50 MG PO TABS: 50.0000 mg | ORAL_TABLET | Freq: Three times a day (TID) | ORAL | 1 refills | Status: DC | PRN

## 2016-09-16 NOTE — Assessment & Plan Note (Signed)
Chronic headaches unchanged in nature in the setting fo not taking reccoemended medications. No red flag symtoms on exam and reassuring imaging studies - cymbalta reordered for him and he was very strongly encouraged to take it. It was emphasized that it was not for " mental issues" but for pain - tramadol refilled - will follow with PCP as needed

## 2016-09-16 NOTE — Progress Notes (Signed)
Opened in error

## 2016-09-16 NOTE — Patient Instructions (Signed)
Follow up as needed Please take Duloxetine for pain, take tramadol as for pain If you have any questions or concerns,c all the office at [phone]

## 2016-09-16 NOTE — Progress Notes (Signed)
   Subjective:    Patient ID: Daniel Nelson, male    DOB: 08/11/1985, 31 y.o.   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 presents for continues pain  Left sided pain - unchanged from chronic pain - he has had normal MRI head and CT head as well as evaluation by neurology - he has been taking motrin only for pain and dos not wish to try Duloxetine because he feels it is for " mental issues" - he denies pain elsewhere besides his face - he denies weakness or reduced hearing of the left ear - pain is present at all times  Smoking status reviewed  Review of Systems  Per HPI, else denies recent illness, fever,  chest pain, shortness of breath,    Objective:  BP 110/68   Pulse 61   Temp 98.2 F (36.8 C) (Oral)   Wt 159 lb 3.2 oz (72.2 kg)   SpO2 99%   BMI 21.00 kg/m  Vitals and nursing note reviewed  General: NAD HEENT: normal bilateral TMs and ear canals no pain with palpation of the pinna, normal oropharynx no skin changes over face Cardiac: RRR, normal heart sounds, no murmurs. Respiratory: CTAB, normal effort Skin: warm and dry, no rashes noted Neuro: alert and oriented, no focal deficits   Assessment & Plan:    Chronic headaches Chronic headaches unchanged in nature in the setting fo not taking reccoemended medications. No red flag symtoms on exam and reassuring imaging studies - cymbalta reordered for him and he was very strongly encouraged to take it. It was emphasized that it was not for " mental issues" but for pain - tramadol refilled - will follow with PCP as needed     Alyssa A. Kennon RoundsHaney MD, MS Family Medicine Resident PGY-3 Pager [phone]

## 2016-09-16 NOTE — Patient Instructions (Signed)
Follow up as needed with PCP

## 2016-09-16 NOTE — Telephone Encounter (Signed)
Please call the patient and inform him that his medicaid is no longer active

## 2016-09-18 NOTE — Telephone Encounter (Signed)
Checked with front desk and pt was self pay at this visit per note on appointment report. Lamonte SakaiZimmerman Rumple, Trany Chernick D, [place]

## 2016-10-05 ENCOUNTER — Telehealth: Payer: Self-pay | Admitting: Family Medicine

## 2016-10-05 NOTE — Telephone Encounter (Signed)
I'm not sure what he needs, but I can write a letter for him tomorrow when I'm back in clinic.

## 2016-10-05 NOTE — Telephone Encounter (Signed)
Patient came to office had to get interpreter to translate for patient. Patient wants Dr Gwendolyn GrantWalden to write a letter for him missing work.  Patient was given letter for his office visit 09/16/16.  Patient said he needs letter from Dr Gwendolyn GrantWalden due to he is the only one that knows about all of his meds and health.  Told patient Dr Gwendolyn GrantWalden not in clinic today, I would send phone msg. For him to call him at [phone].  Patient said he may loose his job because of not having letter.

## 2016-10-07 ENCOUNTER — Telehealth: Payer: Self-pay | Admitting: Family Medicine

## 2016-10-07 NOTE — Telephone Encounter (Signed)
Potassium Cl and B-12 he has questions, and made an appt for 3/22 in same day schedule.  I could not understand what he actually wanted but he was insistant to speak to someone about these medications/vitamin.  Thanks

## 2016-10-07 NOTE — Telephone Encounter (Signed)
Gave to [place] to put up front yesterday.

## 2016-10-07 NOTE — Telephone Encounter (Signed)
Noted.  I look forward to meeting Daniel Nelson tomorrow.

## 2016-10-07 NOTE — Telephone Encounter (Signed)
Pt has an appt on 10/08/16. Will forward this message to Dr. Leveda AnnaHensel as an Lorain ChildesFYI. Sunday SpillersSharon T Aarian Cleaver, CMA

## 2016-10-07 NOTE — Telephone Encounter (Signed)
Letter up front for pick up. Sharon T Saunders, CMA  

## 2016-10-08 ENCOUNTER — Ambulatory Visit (INDEPENDENT_AMBULATORY_CARE_PROVIDER_SITE_OTHER): Payer: Medicaid Other | Admitting: Family Medicine

## 2016-10-08 ENCOUNTER — Encounter: Payer: Self-pay | Admitting: Family Medicine

## 2016-10-08 DIAGNOSIS — R51 Headache: Secondary | ICD-10-CM

## 2016-10-08 DIAGNOSIS — R519 Headache, unspecified: Secondary | ICD-10-CM

## 2016-10-08 MED ORDER — DULOXETINE HCL 60 MG PO CPEP: 60.0000 mg | ORAL_CAPSULE | Freq: Every day | ORAL | 3 refills | Status: DC

## 2016-10-09 NOTE — Assessment & Plan Note (Signed)
He likely does have some TMJ element, but also has a psych componant that I do not fully understand.  Perhaps in the somatoform or PTSD area.  Regardless, I believe the duloxetine is a good choice.  Also needs ongoing follow up with his PCP to better understand these issues.

## 2016-10-09 NOTE — Patient Instructions (Signed)
These problems are best handled by your PCP, Dr. Gwendolyn GrantWalden.  It is best to schedule future appointments with him. Please take your duloxetine and tramadol.

## 2016-10-09 NOTE — Progress Notes (Signed)
   Subjective:    Patient ID: Daniel Nelson, male    DOB: 03/24/1986, 31 y.o.   MRN: [id number]  HPI Oh my.  This visit followed a circuitous path - especially considering it was an SDA visit.  Interpreter Johnny BridgeMartha [phone].   I addressed the following issues. 1. Major issue was intractable headache.  Present x 1 year.  I believe it is worse in the last 2 weeks.  No new symptoms, just worse pain.  He takes the tramadol with some relief.  He apparently did not fill the duloxitine prescription.   2. Medication reaction: He started the visit saying a medication made things worse.  It took time to understand that he was talking about potassium pills.  Apparently, he only took 4 of the seven.  This was seven weeks ago and does not correlate with the worsening of his headache 2 weeks ago. 3. In addition to his headache, he talks about boady aches, weaness, blurry vision, sleeping all the time, anxiety, and numbness of both hands and feet.  As I was trying to wrap up, three additional issues emerged. 1. Dr. Gwendolyn GrantWalden, his PCP, referred him to dentist for TMJ perhaps causing headache.  The two places did not take Medicaid.  Wants that referral set up. 2. [facility].  Because they do not take Medicaid, he is stuck with $281 bill.  He wants us to do something about it. 3. Wanted "doctors note" for the last several months.  I did provide him a note for the days he was seen here.        Review of Systems     Objective:   Physical Exam  HEENT normal except for pain over both TMJ Neck supple Lungs clear Cardiac RRR without m or g Neuro.  Normal strength and sensation in both upper and lower ext.           Assessment & Plan:

## 2016-10-27 ENCOUNTER — Telehealth: Payer: Self-pay | Admitting: Family Medicine

## 2016-10-27 ENCOUNTER — Ambulatory Visit: Payer: Medicaid Other | Admitting: Family Medicine

## 2016-10-27 NOTE — Telephone Encounter (Signed)
LVM using [employer] [phone]). Pt has an appt with Dr Leveda Anna tomorrow. Should pick up letter then. Sunday Spillers, CMA

## 2016-10-27 NOTE — Telephone Encounter (Signed)
Pt needs a letter releasing him back to work. Letter needs to clearly say pt can go back to work. Employer needs this ASAP. ep

## 2016-10-27 NOTE — Telephone Encounter (Signed)
I have completed this letter. I will give it to [facility]

## 2016-10-28 ENCOUNTER — Ambulatory Visit (INDEPENDENT_AMBULATORY_CARE_PROVIDER_SITE_OTHER): Payer: Medicaid Other | Admitting: Family Medicine

## 2016-10-28 ENCOUNTER — Encounter: Payer: Self-pay | Admitting: Family Medicine

## 2016-10-28 ENCOUNTER — Ambulatory Visit (HOSPITAL_COMMUNITY)
Admission: RE | Admit: 2016-10-28 | Discharge: 2016-10-28 | Disposition: A | Discharge status: Home / Self Care | Payer: Medicaid Other | Source: Ambulatory Visit | Attending: Family Medicine | Admitting: Family Medicine

## 2016-10-28 DIAGNOSIS — G894 Chronic pain syndrome: Secondary | ICD-10-CM | POA: Diagnosis not present

## 2016-10-28 DIAGNOSIS — H9203 Otalgia, bilateral: Secondary | ICD-10-CM

## 2016-10-28 DIAGNOSIS — K029 Dental caries, unspecified: Secondary | ICD-10-CM | POA: Diagnosis not present

## 2016-10-28 DIAGNOSIS — F411 Generalized anxiety disorder: Secondary | ICD-10-CM | POA: Diagnosis not present

## 2016-10-28 MED ORDER — AMITRIPTYLINE HCL 10 MG PO TABS: 10.0000 mg | ORAL_TABLET | Freq: Three times a day (TID) | ORAL | 3 refills | Status: DC

## 2016-10-28 NOTE — Assessment & Plan Note (Signed)
I conceptualize him more as a chronic pain syndrome with an emotional componant rather than TMJ.  He does have dental caries on panorex.  The TMJs look OK on those films.  He has been intolerant to a couple of different meds.  Will try low dose elavil and build slowly.

## 2016-10-28 NOTE — Assessment & Plan Note (Signed)
Nothing specifically abnormal about either ear.  Feel this pain is part of his chronic pain syndrome.

## 2016-10-28 NOTE — Patient Instructions (Signed)
Start by taking the new medicine just at night.  Gradually increase to twice a day, then to three times per day. I sent the new medication to the pharmacy. See Dr. Gwendolyn Grant in three weeks.

## 2016-10-28 NOTE — Progress Notes (Signed)
   Subjective:    Patient ID: Daniel Nelson, male    DOB: 05-27-1986, 31 y.o.   MRN: [id number]  HPI Patient returns with tooth pain present x 1 year.  He has had significant workup with MRI of neck and c spine.  He has been labeled TMJ.  No jaw locking or giving out.  He does complain that the pain radiates to neck, both ears, lower jaw and temporalis region.  Also C/O who body numbness. Language barrier with interpreter 1st Tahari 40981 and 2nd Saif 140020 C/O anxiety and nerves Language and cultural barriers prevent me from being more specific about Dx.  It is unclear why he is back to me rather than his PCP.  Asks for a work note. Could not take cymbalta due to flushing.  Currently on no prescription meds.    Review of Systems     Objective:   Physical Exam TMs normal TMJs bilateral click but able to open mouth fully. Teeth, no obvious caries. Neck no nodes.       Assessment & Plan:

## 2016-10-28 NOTE — Assessment & Plan Note (Addendum)
Unclear specific diagnosis.  While he asks for a work note, I do not get the sense of malangering.  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  Given his chronic pain syndrome, I certainly would like to have a more specific diagnosis, which might then guide treatment.  Will defer to his PCP.  Not knowing his back story, there could even be a PTSD componant to his symptom complex.

## 2016-10-31 ENCOUNTER — Emergency Department (HOSPITAL_COMMUNITY)
Admission: EM | Admit: 2016-10-31 | Discharge: 2016-11-01 | Disposition: A | Discharge status: Home / Self Care | Payer: Medicaid Other | Attending: Emergency Medicine | Admitting: Emergency Medicine

## 2016-10-31 ENCOUNTER — Emergency Department (HOSPITAL_COMMUNITY): Payer: Medicaid Other

## 2016-10-31 ENCOUNTER — Encounter (HOSPITAL_COMMUNITY): Payer: Self-pay | Admitting: Emergency Medicine

## 2016-10-31 DIAGNOSIS — F17210 Nicotine dependence, cigarettes, uncomplicated: Secondary | ICD-10-CM | POA: Insufficient documentation

## 2016-10-31 DIAGNOSIS — M79622 Pain in left upper arm: Secondary | ICD-10-CM | POA: Diagnosis present

## 2016-10-31 DIAGNOSIS — Y999 Unspecified external cause status: Secondary | ICD-10-CM | POA: Insufficient documentation

## 2016-10-31 DIAGNOSIS — M7022 Olecranon bursitis, left elbow: Secondary | ICD-10-CM | POA: Insufficient documentation

## 2016-10-31 DIAGNOSIS — Y929 Unspecified place or not applicable: Secondary | ICD-10-CM | POA: Diagnosis not present

## 2016-10-31 DIAGNOSIS — W228XXA Striking against or struck by other objects, initial encounter: Secondary | ICD-10-CM | POA: Diagnosis not present

## 2016-10-31 DIAGNOSIS — Y939 Activity, unspecified: Secondary | ICD-10-CM | POA: Insufficient documentation

## 2016-10-31 MED ORDER — NAPROXEN 250 MG PO TABS
500.0000 mg | ORAL_TABLET | Freq: Once | ORAL | Status: DC
  Filled 2016-10-31: qty 2

## 2016-10-31 MED ORDER — NAPROXEN 500 MG PO TABS: 500.0000 mg | ORAL_TABLET | Freq: Two times a day (BID) | ORAL | 0 refills | Status: DC

## 2016-10-31 NOTE — ED Provider Notes (Signed)
[facility] Provider Note   CSN: [id number] Arrival date & time: 10/31/16  2110  By signing my name below, I, Nelwyn Salisbury, attest that this documentation has been prepared under the direction and in the presence of non-physician practitioner, Candie Mile, [address]  Electronically Signed: Nelwyn Salisbury, Scribe. 10/31/2016. 10:58 PM.  History   Chief Complaint Chief Complaint  Patient presents with  . Arm Pain   The history is provided by the patient. A language interpreter was used ([id number]).    HPI Comments:  Daniel Nelson is a 31 y.o. male who presents to the Emergency Department complaining of constant, unchanged, mild left elbow pain after falling yesterday. He describes his pain as a 4/10 burning pain exacerbated on extension. Pt was sitting when he fell and hit his elbow on a protruding piece of metal. He reports associated swelling to the area. Pt has not tried anything prior to arrival for his symptoms. Denies any fevers, chills, nausea, vomiting or diarrhea.   Past Medical History:  Diagnosis Date  . Headache   . Paresthesia     Patient Active Problem List   Diagnosis Date Noted  . Chronic pain syndrome 10/28/2016  . Tension-type headache, not intractable 07/09/2016  . Anxiety state 06/10/2016  . Deviated septum 03/27/2016  . Paresthesias 02/28/2016  . Chronic headaches 01/24/2016  . Epigastric pain 01/24/2016  . Myalgia 11/22/2015  . Seasonal allergies 11/11/2015  . H. pylori infection 08/17/2015  . Refugee health examination 08/01/2015  . Otalgia 08/01/2015  . H/O eye surgery 08/01/2015    Past Surgical History:  Procedure Laterality Date  . APPENDECTOMY    . EYE SURGERY Right "as a young child"       Home Medications    Prior to Admission medications   Medication Sig Start Date End Date Taking? Authorizing Provider  amitriptyline (ELAVIL) 10 MG tablet Take 1 tablet (10 mg total) by mouth 3 (three) times daily. 10/28/16   Moses Manners, MD  naproxen (NAPROSYN) 500 MG tablet Take 1 tablet (500 mg total) by mouth 2 (two) times daily. 10/31/16   Mayer Vondrak Orson Aloe, [place]    Family History Family History  Problem Relation Age of Onset  . Rheum arthritis Mother   . Healthy Father   . Colon cancer Neg Hx     Social History Social History  Substance Use Topics  . Smoking status: Current Some Day Smoker    Types: Cigarettes  . Smokeless tobacco: Never Used     Comment: tobacco info given 01/07/16  . Alcohol use No     Allergies   Duloxetine; Tramadol; and Dicyclomine   Review of Systems Review of Systems  Constitutional: Negative for chills and fever.  Gastrointestinal: Negative for diarrhea, nausea and vomiting.  Musculoskeletal: Positive for arthralgias and joint swelling.     Physical Exam Updated Vital Signs BP 120/69 (BP Location: Right Arm)   Pulse 63   Temp 98.6 F (37 C) (Oral)   Resp 16   Ht  (1.88 m)   Wt 72.6 kg   SpO2 99%   BMI 20.54 kg/m   Physical Exam  Constitutional: He appears well-developed and well-nourished.  Well appearing  HENT:  Head: Normocephalic and atraumatic.  Nose: Nose normal.  Eyes: Conjunctivae and EOM are normal.  Neck: Normal range of motion.  Cardiovascular: Normal rate and intact distal pulses.   2+ DP pulses to BUE.   Pulmonary/Chest: Effort normal. No respiratory distress.  Normal work of  breathing. No respiratory distress noted.   Abdominal: Soft.  Musculoskeletal: Normal range of motion.  Left elbow with good range of motion. Moderate swelling over elbow and over olecranon process. Mildly tender to palpation. No redness or warmth noted. Left shoulder, wrist and fingers with good range of motion, no redness, swelling, tenderness palpation, deformity.  Neurological: He is alert.  Sensation intact to BUE and distal to area affected. Muscle strength 5/5 and intact against elbow flexion and extension.   Skin: Skin is warm.  Psychiatric: He has  a normal mood and affect. His behavior is normal.  Nursing note and vitals reviewed.    ED Treatments / Results  DIAGNOSTIC STUDIES:  Oxygen Saturation is 99% on RA, normal by my interpretation.    COORDINATION OF CARE:  11:56 PM Discussed treatment plan with pt at bedside and pt agreed to plan.  Labs (all labs ordered are listed, but only abnormal results are displayed) Labs Reviewed - No data to display  EKG  EKG Interpretation None       Radiology Dg Elbow Complete Left  Result Date: 10/31/2016 CLINICAL DATA:  Status post fall into arm of couch with left elbow, with left elbow pain and swelling. Initial encounter. EXAM: LEFT ELBOW - COMPLETE 3+ VIEW COMPARISON:  None. FINDINGS: There is no evidence of fracture or dislocation. The visualized joint spaces are preserved. No significant joint effusion is identified. Diffuse dorsal soft tissue swelling is noted overlying the elbow. IMPRESSION: 1. No evidence of fracture or dislocation. 2. Diffuse dorsal soft tissue swelling overlying the elbow. Electronically Signed   By: Roanna Raider M.D.   On: 10/31/2016 22:45    Procedures Procedures (including critical care time)  Medications Ordered in ED Medications  naproxen (NAPROSYN) tablet 500 mg (not administered)     Initial Impression / Assessment and Plan / ED Course  I have reviewed the triage vital signs and the nursing notes.  Pertinent labs & imaging results that were available during my care of the patient were reviewed by me and considered in my medical decision making (see chart for details).    Patient here with likely olecranon bursitis. Patient X-Ray negative for obvious fracture or dislocation. Pain managed in ED. Pt advised to follow up with orthopedics if symptoms persist for possibility of missed fracture diagnosis. Patient given ace bandage while in ED, conservative therapy recommended and discussed. Patient in NAD, hemodynamically stable, afebrile. Patient  will be dc home & is agreeable with above plan. Return precautions given.   Final Clinical Impressions(s) / ED Diagnoses   Final diagnoses:  Olecranon bursitis of left elbow    New Prescriptions New Prescriptions   NAPROXEN (NAPROSYN) 500 MG TABLET    Take 1 tablet (500 mg total) by mouth 2 (two) times daily.  I personally performed the services described in this documentation, which was scribed in my presence. The recorded information has been reviewed and is accurate.    [address] [place], [place] 10/31/16 2356    Zadie Rhine, MD 11/03/16 [phone]

## 2016-10-31 NOTE — ED Triage Notes (Signed)
Reports trying to sit on couch and falling into arm of couch with left elbow.  Pain and swelling in left elbow.  Worse with movement.

## 2016-10-31 NOTE — ED Notes (Signed)
ED Provider at bedside. 

## 2016-10-31 NOTE — Discharge Instructions (Signed)
Please use ibuprofen or naproxen as needed for pain and swelling. Please use cold compress to the area. Please use Ace wrap as needed. Continue rest, ice, compression, elevation of the elbow. Please follow up with orthopedic surgery in 1-2 weeks as needed.  Contact a health care provider if: You have a fever. Your symptoms do not get better with treatment. Your pain or swelling gets worse. Your elbow pain or swelling goes away and then returns. You have drainage of pus from the swollen area over your elbow.

## 2016-11-17 ENCOUNTER — Ambulatory Visit (HOSPITAL_COMMUNITY)
Admission: EM | Admit: 2016-11-17 | Discharge: 2016-11-17 | Disposition: A | Discharge status: Home / Self Care | Payer: Medicaid Other | Attending: Family Medicine | Admitting: Family Medicine

## 2016-11-17 ENCOUNTER — Encounter (HOSPITAL_COMMUNITY): Payer: Self-pay | Admitting: *Deleted

## 2016-11-17 DIAGNOSIS — G44229 Chronic tension-type headache, not intractable: Secondary | ICD-10-CM

## 2016-11-17 DIAGNOSIS — H6593 Unspecified nonsuppurative otitis media, bilateral: Secondary | ICD-10-CM | POA: Diagnosis not present

## 2016-11-17 MED ORDER — IPRATROPIUM BROMIDE 0.03 % NA SOLN: 2.0000 | Freq: Two times a day (BID) | NASAL | 0 refills | Status: DC

## 2016-11-17 NOTE — Discharge Instructions (Signed)
Take the tramadol as you were initially directed.  Use the atrovent nasal spray as directed every day to drain the fluid in the ear.  Call the [facility] for an appointment or return to urgent care if symptoms get worse.

## 2016-11-17 NOTE — ED Provider Notes (Signed)
[facility]    CSN: [id number] Arrival date & time: 11/17/16  1521     History   Chief Complaint Chief Complaint  Patient presents with  . Headache    HPI Daniel Nelson is a 31 y.o. male presenting to urgent care for chronic headache and acute ear fullness.   Video interpretor is used throughout encounter (see RN notes for details). He describes an intermittent aching pain that is between 7/10 - 10/10 originating in the bilateral occipital area, radiating anteriorly in the scalp. He has been seen many times by his PCP and others for this with work up including MRI/MRA which were negative. He reports not taking the medications most recently prescribed (tramadol and elavil) and not tolerating past medications (cymbalta). 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 No visual changes, neck pain/stiffness, or fever. He denies history of TBI/concussion.  Over the past few days he reports ear fullness. While he has frequently been seen for ear pain, labeled as TMJ, he currently denies pain. No tinnitus, trouble hearing. No congestion, rhinorrhea, cough, chest pain, dyspnea, palpitations. No medications tried.    HPI  Past Medical History:  Diagnosis Date  . Headache   . Paresthesia     Patient Active Problem List   Diagnosis Date Noted  . Chronic pain syndrome 10/28/2016  . Tension-type headache, not intractable 07/09/2016  . Anxiety state 06/10/2016  . Deviated septum 03/27/2016  . Paresthesias 02/28/2016  . Chronic headaches 01/24/2016  . Epigastric pain 01/24/2016  . Myalgia 11/22/2015  . Seasonal allergies 11/11/2015  . H. pylori infection 08/17/2015  . Refugee health examination 08/01/2015  . Otalgia 08/01/2015  . H/O eye surgery 08/01/2015    Past Surgical History:  Procedure Laterality Date  . APPENDECTOMY    . EYE SURGERY Right "as a young  child"       Home Medications    Prior to Admission medications   Medication Sig Start Date End Date Taking? Authorizing Provider  amitriptyline (ELAVIL) 10 MG tablet Take 1 tablet (10 mg total) by mouth 3 (three) times daily. 10/28/16   Moses Manners, MD  ipratropium (ATROVENT) 0.03 % nasal spray Place 2 sprays into both nostrils every 12 (twelve) hours. 11/17/16   Tyrone Nine, MD  naproxen (NAPROSYN) 500 MG tablet Take 1 tablet (500 mg total) by mouth 2 (two) times daily. 10/31/16   Francisco Orson Aloe, [place]    Family History Family History  Problem Relation Age of Onset  . Rheum arthritis Mother   . Healthy Father   . Colon cancer Neg Hx     Social History Social History  Substance Use Topics  . Smoking status: Current Some Day Smoker    Types: Cigarettes  . Smokeless tobacco: Never Used     Comment: tobacco info given 01/07/16  . Alcohol use No     Allergies   Duloxetine; Tramadol; and Dicyclomine   Review of Systems Review of Systems Per HPI, and no fevers.  Physical Exam Physical Exam BP 110/74   Pulse 78   Temp 98.6 F (37 C) (Oral)   Resp 18   SpO2 100%  Gen: Thin, well-appearing 31 y.o.male in NAD HEENT: Normocephalic, sclerae clear, conjunctivae normal, pupils equal and reactive, TMs translucent, retracted with clear effusions bilaterally. No inflammation or purulence noted. Nares normal with dried nasal discharge and boggy turbinates, moist mucous membranes,  posterior oropharynx clear, fair dentition Pulm: Non-labored breathing ambient air; CTAB, no wheezes or crackles CV: Regular rate, no murmur; radial, DP and PT pulses 2+ bilaterally; No LE edema, no JVD, cap refill < 2 sec. Neuro: Alert and oriented x4, CN II-XII without deficits, no focal deficits in sensation or motor function, patellar DTRs 2+ bilaterally. Fluent speech without aphasia. Gait normal, narrow-based. Romberg negative. No pronator drift.  UC Treatments / Results   Procedures Procedures (including critical care time)  Initial Impression / Assessment and Plan / UC Course  I have reviewed the triage vital signs and the nursing notes.  Pertinent labs & imaging results that were available during my care of the patient were reviewed by me and considered in my medical decision making (see chart for details).  Final Clinical Impressions(s) / UC Diagnoses   Final diagnoses:  Middle ear effusion, bilateral  Chronic tension-type headache, not intractable   31 y.o. male presenting for chronic headache most consistent with tension-type headache. This has been worked up extensively and there are currently no red flags indicating escalation of care.  - Recommend taking NSAID and tramadol (recently prescribed but has not taken), to be used < 4x / week. Exercise, sleep hygiene, and stable diet also recommended. Follow up with PCP for ongoing care.  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 Defer to PCP.   For bilateral ear effusions, will trial atrovent nasal spray. No evidence of infection.   New Prescriptions New Prescriptions   IPRATROPIUM (ATROVENT) 0.03 % NASAL SPRAY    Place 2 sprays into both nostrils every 12 (twelve) hours.     Tyrone Nine, MD 11/17/16 [phone]

## 2016-11-17 NOTE — ED Triage Notes (Addendum)
Pt   Reports   Headache     dizzyness      Symptoms   X  3  Days    Not    Taking  Any  meds   No  specefic    Injury     Feels   Pressure  In  Ears  As  Well     No  Cough     Appears  In no  Severe   Distress      Also   Reports  Feels  Like   decrese  In   Focusing

## 2016-11-26 ENCOUNTER — Ambulatory Visit: Payer: Medicaid Other | Admitting: Family Medicine

## 2016-11-26 ENCOUNTER — Ambulatory Visit (INDEPENDENT_AMBULATORY_CARE_PROVIDER_SITE_OTHER): Payer: Medicaid Other | Admitting: Family Medicine

## 2016-11-26 ENCOUNTER — Encounter: Payer: Self-pay | Admitting: Family Medicine

## 2016-11-26 ENCOUNTER — Other Ambulatory Visit: Payer: Self-pay | Admitting: Family Medicine

## 2016-11-26 VITALS — BP 99/62 | HR 56 | Temp 98.0°F | Wt 161.0 lb

## 2016-11-26 DIAGNOSIS — R519 Headache, unspecified: Secondary | ICD-10-CM

## 2016-11-26 DIAGNOSIS — R51 Headache: Secondary | ICD-10-CM | POA: Diagnosis present

## 2016-11-26 DIAGNOSIS — G8929 Other chronic pain: Secondary | ICD-10-CM

## 2016-11-26 DIAGNOSIS — Z789 Other specified health status: Secondary | ICD-10-CM | POA: Diagnosis not present

## 2016-11-26 MED ORDER — AMITRIPTYLINE HCL 10 MG PO TABS: 10.0000 mg | ORAL_TABLET | Freq: Every day | ORAL | 0 refills | Status: DC

## 2016-11-26 NOTE — Patient Instructions (Addendum)
I recommend that you pick up the Amitriptyline.  STOP taking the Tramadol.  This interacts with Amitriptyline.   Follow up with Dr Gwendolyn GrantWalden in the next couple of weeks.

## 2016-11-26 NOTE — Assessment & Plan Note (Addendum)
Patient with unremarkable neurologic/ MSK exam.  There is no evidence of infection.  He has no red flag signs.  It appears that he has struggled with chronic headaches for some time.  I reviewed his MRI brain report from 2017 and c-spine report, which were unrevealing for explanation of his headache.  It appears that he has seen a neurologist for this in the past and they were unable to find a neurologic explanation for headaches. He was evaluated in April of this year and prescribed Amitriptyline but this was never picked up.  Since tramadol is not helpful and has interactions with TCAs, I have recommended that he completely discontinue Tramadol and initiate Amitriptyline 10mg  QHS.  I hope that this will help with both his headaches and anxiety/ mood.  We discussed the potential sedating effects.  I encouraged him to take this at bedtime.   Could consider sleep study to evaluate for possible OSA.  He does not fit the typical obese body habitus for this.  Though he does have a slight micrognathia on exam.  I encouraged him to follow up in 2 weeks with PCP to see how his symptoms are doing.  Return precautions reviewed.

## 2016-11-26 NOTE — Progress Notes (Signed)
Subjective: CC: headache ZOX:WRUEAVW Daniel Nelson is a 31 y.o. male presenting to clinic today for same day appointment. PCP: Tobey Grim, MD Concerns today include:  [employer] phone interpreter [place], [place] [phone] used for Arabic translation of this visit  1. Headache Patient seen in ED 11/17/16 for similar symptoms.  Patient reports a 10/10 headache that is different than his baseline headaches.  Headaches have been present for a long time but seem worse over the last 4 days.  He reports that headaches are daily and last all day long.  He reports that headache radiates to eyes and neck.  He reports heaviness in his head.  He reports he has taken Motrin, Tramadol with no improvement.  He takes these daily with no improvement.  Last dose was last night.  He never picked up Amitriptyline that was prescribed by Dr Leveda Anna last month.  He reports photophobia, phonophobia when headaches occur.  He denies nausea, vomiting, visual disturbance.  He reports that headaches are relieved by rest/ sleep.  He reports difficulty with focus during headaches.  He reports feeling generalized weakness during episodes.  No unilateral symptoms, confusion or slurred speech.  No loss of consciousness or preceding injury.  He denies fevers, chills.  He reports dizziness associated with headaches.  Allergies  Allergen Reactions  . Duloxetine     flushing  . Tramadol   . Dicyclomine Anxiety    Potential anxiety and GI distress?    Social Hx reviewed: active smoker. MedHx, current medications and allergies reviewed.  Please see EMR. ROS: Per HPI  Objective: Office vital signs reviewed. BP 99/62   Pulse (!) 56   Temp 98 F (36.7 C) (Oral)   Wt 161 lb (73 kg)   SpO2 98%   BMI 20.67 kg/m   Physical Examination:  General: Awake, alert, thin male, No acute distress HEENT: Normal    Neck: No masses palpated. No lymphadenopathy    Ears: Tympanic membranes intact, normal light reflex, no erythema, no  bulging    Eyes: PERRLA, EOMI, sclera white    Nose: nasal turbinates moist, no nasal discharge    Throat: moist mucus membranes, no erythema, no tonsillar exudate.  Airway is patent Cardio: slightly bradycardic but regular rhythm, S1S2 heard, no murmurs appreciated Pulm: clear to auscultation bilaterally, no wheezes, rhonchi or rales; normal work of breathing on room air MSK: normal gait and normal station; has full AROM of c-spine, no nuchal rigidity. No midline TTP to c-spine. Skin: dry; intact; no rashes or lesions Neuro: 5/5 UE and LE Strength and light touch sensation grossly intact, CN 2-12 grossly in tact  Assessment/ Plan: 31 y.o. male   Chronic headaches Patient with unremarkable neurologic/ MSK exam.  There is no evidence of infection.  He has no red flag signs.  It appears that he has struggled with chronic headaches for some time.  I reviewed his MRI brain report from 2017 and c-spine report, which were unrevealing for explanation of his headache.  It appears that he has seen a neurologist for this in the past and they were unable to find a neurologic explanation for headaches. He was evaluated in April of this year and prescribed Amitriptyline but this was never picked up.  Since tramadol is not helpful and has interactions with TCAs, I have recommended that he completely discontinue Tramadol and initiate Amitriptyline 10mg  QHS.  I hope that this will help with both his headaches and anxiety/ mood.  We discussed the potential sedating  effects.  I encouraged him to take this at bedtime.  I encouraged him to follow up in 2 weeks with PCP to see how his symptoms are doing.  Return precautions reviewed.  Language barrier affecting health care - video interpreter used for this exam, see above.  Raliegh IpAshly M Miku Udall, DO PGY-3, [facility]

## 2016-11-27 ENCOUNTER — Encounter (HOSPITAL_COMMUNITY): Payer: Self-pay | Admitting: Emergency Medicine

## 2016-11-27 ENCOUNTER — Emergency Department (HOSPITAL_COMMUNITY): Payer: Medicaid Other

## 2016-11-27 ENCOUNTER — Other Ambulatory Visit: Payer: Self-pay

## 2016-11-27 ENCOUNTER — Emergency Department (HOSPITAL_COMMUNITY)
Admission: EM | Admit: 2016-11-27 | Discharge: 2016-11-27 | Disposition: A | Discharge status: Home / Self Care | Payer: Medicaid Other | Attending: Emergency Medicine | Admitting: Emergency Medicine

## 2016-11-27 DIAGNOSIS — R51 Headache: Secondary | ICD-10-CM | POA: Diagnosis not present

## 2016-11-27 DIAGNOSIS — F17210 Nicotine dependence, cigarettes, uncomplicated: Secondary | ICD-10-CM | POA: Insufficient documentation

## 2016-11-27 DIAGNOSIS — G8929 Other chronic pain: Secondary | ICD-10-CM

## 2016-11-27 LAB — CBC
HEMATOCRIT: 44.1 % (ref 39.0–52.0)
HEMOGLOBIN: 15 g/dL (ref 13.0–17.0)
MCH: 31.4 pg (ref 26.0–34.0)
MCHC: 34 g/dL (ref 30.0–36.0)
MCV: 92.3 fL (ref 78.0–100.0)
Platelets: 148 10*3/uL — ABNORMAL LOW (ref 150–400)
RBC: 4.78 MIL/uL (ref 4.22–5.81)
RDW: 12.5 % (ref 11.5–15.5)
WBC: 9.1 10*3/uL (ref 4.0–10.5)

## 2016-11-27 LAB — BASIC METABOLIC PANEL
ANION GAP: 7 (ref 5–15)
BUN: 7 mg/dL (ref 6–20)
CHLORIDE: 105 mmol/L (ref 101–111)
CO2: 26 mmol/L (ref 22–32)
Calcium: 8.9 mg/dL (ref 8.9–10.3)
Creatinine, Ser: 0.92 mg/dL (ref 0.61–1.24)
GFR calc Af Amer: >60 mL/min (ref >60)
GFR calc non Af Amer: >60 mL/min (ref >60)
GLUCOSE: 92 mg/dL (ref 65–99)
POTASSIUM: 4 mmol/L (ref 3.5–5.1)
Sodium: 138 mmol/L (ref 135–145)

## 2016-11-27 LAB — URINALYSIS, ROUTINE W REFLEX MICROSCOPIC
BACTERIA UA: NONE SEEN
Bilirubin Urine: NEGATIVE
Glucose, UA: NEGATIVE mg/dL
KETONES UR: NEGATIVE mg/dL
Leukocytes, UA: NEGATIVE
Nitrite: NEGATIVE
PROTEIN: NEGATIVE mg/dL
Specific Gravity, Urine: 1.004 — ABNORMAL LOW (ref 1.005–1.030)
Squamous Epithelial / LPF: NONE SEEN
WBC UA: NONE SEEN WBC/hpf (ref 0–5)
pH: 6 (ref 5.0–8.0)

## 2016-11-27 MED ORDER — CYCLOBENZAPRINE HCL 10 MG PO TABS: 10.0000 mg | ORAL_TABLET | Freq: Two times a day (BID) | ORAL | 0 refills | Status: DC | PRN

## 2016-11-27 NOTE — ED Notes (Signed)
Patient taken to CT.

## 2016-11-27 NOTE — ED Provider Notes (Signed)
[facility]-EMERGENCY DEPT Provider Note   CSN: [id number] Arrival date & time: 11/27/16  1443     History   Chief Complaint Chief Complaint  Patient presents with  . Headache  . Loss of Consciousness    HPI Daniel Nelson is a 31 y.o. male.  Patient presents with cc HA that has been ongoing for > 1 year. He describes an burning pain that is currently 6 out of 10, 10 out of 10 upon arrival in the bilateral occipital area, radiating anteriorly to the top of the scalp. He also develops numb sensation in bilateral hands at times. No overt weakness, none currently. Also states he passed out 4 days ago. He has been worked up for this in the past with CT head and MRI/MRA which were negative. He has taken tramadol, Elavil, Cymbalta in the past. He was told that there is nothing wrong and that he has depression. He does not feel this is the answer to why he is having pain. No visual changes, neck pain/stiffness, or fever. Sometimes he has some dark spots in his left visual field but nothing currently. No injury. Patient states that the symptoms have been worse over the past 4 days which is why he came to the emergency department today. Patient denies warning symptoms including: fecal incontinence, urinary retention or overflow incontinence, night sweats, waking from sleep with pain, unexplained fevers or weight loss, h/o cancer, IVDU, recent trauma.    History taken with interpreter at bedside.      Past Medical History:  Diagnosis Date  . Headache   . Paresthesia     Patient Active Problem List   Diagnosis Date Noted  . Chronic pain syndrome 10/28/2016  . Tension-type headache, not intractable 07/09/2016  . Anxiety state 06/10/2016  . Deviated septum 03/27/2016  . Paresthesias 02/28/2016  . Chronic headaches 01/24/2016  . Epigastric pain 01/24/2016  . Myalgia 11/22/2015  . Seasonal allergies 11/11/2015  . H. pylori infection 08/17/2015  . Refugee health examination 08/01/2015    . Otalgia 08/01/2015  . H/O eye surgery 08/01/2015    Past Surgical History:  Procedure Laterality Date  . APPENDECTOMY    . EYE SURGERY Right "as a young child"       Home Medications    Prior to Admission medications   Medication Sig Start Date End Date Taking? Authorizing Provider  amitriptyline (ELAVIL) 10 MG tablet TAKE 1 TABLET BY MOUTH AT BEDTIME 11/26/16   Tobey Grim, MD  ipratropium (ATROVENT) 0.03 % nasal spray Place 2 sprays into both nostrils every 12 (twelve) hours. 11/17/16   Tyrone Nine, MD  naproxen (NAPROSYN) 500 MG tablet Take 1 tablet (500 mg total) by mouth 2 (two) times daily. 10/31/16   Alvina Chou, PA  omeprazole (PRILOSEC) 20 MG capsule Take 20 mg by mouth daily. 10/07/16   [provider]    Family History Family History  Problem Relation Age of Onset  . Rheum arthritis Mother   . Healthy Father   . Colon cancer Neg Hx     Social History Social History  Substance Use Topics  . Smoking status: Current Some Day Smoker    Types: Cigarettes  . Smokeless tobacco: Never Used     Comment: tobacco info given 01/07/16  . Alcohol use No     Allergies   Duloxetine; Tramadol; and Dicyclomine   Review of Systems Review of Systems  Constitutional: Negative for fever.  HENT: Negative for congestion, dental problem,  rhinorrhea and sinus pressure.   Eyes: Negative for photophobia, discharge, redness and visual disturbance.  Respiratory: Negative for shortness of breath.   Cardiovascular: Negative for chest pain.  Gastrointestinal: Negative for nausea and vomiting.  Musculoskeletal: Negative for gait problem, neck pain and neck stiffness.  Skin: Negative for rash.  Neurological: Positive for syncope, numbness and headaches. Negative for speech difficulty, weakness and light-headedness.  Psychiatric/Behavioral: Negative for confusion. The patient is nervous/anxious.      Physical Exam Updated Vital Signs BP 113/79 (BP  Location: Left Arm)   Pulse (!) 56   Resp (!) 26   SpO2 98%   Physical Exam  Constitutional: He is oriented to person, place, and time. He appears well-developed and well-nourished.  HENT:  Head: Normocephalic and atraumatic.  Right Ear: Tympanic membrane, external ear and ear canal normal.  Left Ear: Tympanic membrane, external ear and ear canal normal.  Nose: Nose normal.  Mouth/Throat: Uvula is midline, oropharynx is clear and moist and mucous membranes are normal.  Eyes: Conjunctivae, EOM and lids are normal. Pupils are equal, round, and reactive to light. Right eye exhibits no discharge. Left eye exhibits no discharge.  Neck: Normal range of motion. Neck supple.  Cardiovascular: Normal rate, regular rhythm and normal heart sounds.   Pulmonary/Chest: Effort normal and breath sounds normal.  Abdominal: Soft. There is no tenderness.  Musculoskeletal: Normal range of motion.       Cervical back: He exhibits normal range of motion, no tenderness and no bony tenderness.  Neurological: He is alert and oriented to person, place, and time. He has normal strength and normal reflexes. No cranial nerve deficit or sensory deficit. He exhibits normal muscle tone. He displays a negative Romberg sign. Coordination and gait normal. GCS eye subscore is 4. GCS verbal subscore is 5. GCS motor subscore is 6.  Skin: Skin is warm and dry.  Psychiatric: His mood appears anxious.  Nursing note and vitals reviewed.    ED Treatments / Results  Labs (all labs ordered are listed, but only abnormal results are displayed) Labs Reviewed  CBC - Abnormal; Notable for the following:       Result Value   Platelets 148 (*)    All other components within normal limits  URINALYSIS, ROUTINE W REFLEX MICROSCOPIC - Abnormal; Notable for the following:    Color, Urine COLORLESS (*)    Specific Gravity, Urine 1.004 (*)    Hgb urine dipstick SMALL (*)    All other components within normal limits  BASIC METABOLIC  PANEL  CBG MONITORING, ED    Procedures Procedures (including critical care time)  Medications Ordered in ED Medications - No data to display   Initial Impression / Assessment and Plan / ED Course  I have reviewed the triage vital signs and the nursing notes.  Pertinent labs & imaging results that were available during my care of the patient were reviewed by me and considered in my medical decision making (see chart for details).     Patient seen and examined. Previous workup reviewed. This is a chronic issue for the patient would benefit from outpatient treatment. He requests a CT today given persistent symptoms and this was ordered. Discussed limitations of the scan.  Vital signs reviewed and are as follows: BP 113/79 (BP Location: Left Arm)   Pulse (!) 56   Resp (!) 26   SpO2 98%   Offered treatment for headache, he declines.  7:24 PM Pt updated using interpreter. Attempted to reassure  patient based on results of scan today and previous workup.   Encouraged neuro f/u.   Patient counseled to return if they have persistent weakness in their arms or legs, slurred speech, trouble walking or talking, confusion, trouble with their balance, or if they have any other concerns. Patient verbalizes understanding and agrees with plan.    Final Clinical Impressions(s) / ED Diagnoses   Final diagnoses:  Chronic nonintractable headache, unspecified headache type   Patient presents with continued chronic headaches today. No without high-risk features of headache including: sudden onset/thunderclap HA, no similar headache in past, altered mental status, accompanying seizure, headache with exertion, age > 550, history of immunocompromise, fever, use of anticoagulation, family history of spontaneous SAH, concomitant drug use, toxic exposure.   Patient has a normal complete neurological exam, normal vital signs, normal level of consciousness, no signs of meningismus, is  well-appearing/non-toxic appearing, no signs of trauma.  CT imaging ordered and is negative. Patient has had workup in the past 1-2 years with MRI MRA of brain and MRA of neck, both of which were unrevealing. Will add Flexeril today to treat acute pain as this seems mostly musculoskeletal in nature.  I am not entirely certain what to make of patient's reported syncope and intermittent weakness, however there is no bleeding on CT. He has no objective neuro or cardiopulm findings at time of exam. Do not feel that advanced imaging is indicated at this time. Neurology referral given.   No dangerous or life-threatening conditions suspected or identified by history, physical exam, and by work-up. No indications for hospitalization identified.    New Prescriptions Discharge Medication List as of 11/27/2016  7:06 PM    START taking these medications   Details  cyclobenzaprine (FLEXERIL) 10 MG tablet Take 1 tablet (10 mg total) by mouth 2 (two) times daily as needed for muscle spasms., Starting Fri 11/27/2016, Print         Renne CriglerGeiple, Carlene Bickley, PA-C 11/27/16 1925    Rolan BuccoBelfi, Melanie, MD 11/27/16 [phone]

## 2016-11-27 NOTE — ED Triage Notes (Signed)
Pt reports occipital headache x1 year, syncopal episode 4 days ago as well as numbness in bilateral hands. Pt a/ox4, resp e/u. Pt reports he saw pcp for headache but is worried he may need to have his head scanned.

## 2016-11-27 NOTE — Discharge Instructions (Signed)
Please read and follow all provided instructions.  Your diagnoses today include:  1. Chronic nonintractable headache, unspecified headache type     Tests performed today include:  CT of your head which was normal and did not show any serious cause of your headache  Vital signs. See below for your results today.   Medications:   None  Take any prescribed medications only as directed.  Additional information:  Follow any educational materials contained in this packet.  You are having a headache. No specific cause was found today for your headache. It may have been a migraine or other cause of headache. Stress, anxiety, fatigue, and depression are common triggers for headaches.   Your headache today does not appear to be life-threatening or require hospitalization, but often the exact cause of headaches is not determined in the emergency department. Therefore, follow-up with your doctor is very important to find out what may have caused your headache and whether or not you need any further diagnostic testing or treatment.   Sometimes headaches can appear benign (not harmful), but then more serious symptoms can develop which should prompt an immediate re-evaluation by your doctor or the emergency department.  BE VERY CAREFUL not to take multiple medicines containing Tylenol (also called acetaminophen). Doing so can lead to an overdose which can damage your liver and cause liver failure and possibly death.   Follow-up instructions: Please follow-up with your primary care provider in the next 3 days for further evaluation of your symptoms.   Return instructions:   Please return to the Emergency Department if you experience worsening symptoms.  Return if the medications do not resolve your headache, if it recurs, or if you have multiple episodes of vomiting or cannot keep down fluids.  Return if you have a change from the usual headache.  RETURN IMMEDIATELY IF you:  Develop a  sudden, severe headache  Develop confusion or become poorly responsive or faint  Develop a fever above 100.63F or problem breathing  Have a change in speech, vision, swallowing, or understanding  Develop new weakness, numbness, tingling, incoordination in your arms or legs  Have a seizure  Please return if you have any other emergent concerns.  Additional Information:  Your vital signs today were: BP 112/75    Pulse (!) 57    Resp 20    SpO2 98%  If your blood pressure (BP) was elevated above 135/85 this visit, please have this repeated by your doctor within one month. --------------

## 2016-12-08 ENCOUNTER — Encounter: Payer: Self-pay | Admitting: Family Medicine

## 2016-12-08 ENCOUNTER — Ambulatory Visit (INDEPENDENT_AMBULATORY_CARE_PROVIDER_SITE_OTHER): Payer: Medicaid Other | Admitting: Family Medicine

## 2016-12-08 VITALS — BP 102/60 | HR 60 | Temp 98.1°F | Wt 162.3 lb

## 2016-12-08 DIAGNOSIS — F411 Generalized anxiety disorder: Secondary | ICD-10-CM

## 2016-12-08 DIAGNOSIS — R51 Headache: Secondary | ICD-10-CM | POA: Diagnosis not present

## 2016-12-08 DIAGNOSIS — R519 Headache, unspecified: Secondary | ICD-10-CM

## 2016-12-08 NOTE — Progress Notes (Signed)
   Subjective: CC: anxiety ZOX:WRUEAVWHPI:Daniel Nelson is a 31 y.o. male presenting to clinic today for same day appointment. PCP: Tobey GrimWalden, Jeffrey H, MD Concerns today include:  [employer] video interpreter [name], [place] [phone] used for Arabic translation of this visit [employer] video interpreter [name], [place] [id number] used for Arabic translation of this visit  1. Anxiety Patient reports that he continues to have anxiety.  He reports poor concentration, noting that sometimes he will cross the street inadvertently because he is not paying attention well.  He reports excessive sedation with previous medications.  He worries that this impacts his ability to perform his job safely.  He works driving for [employer].  He reports he never started Amitriptyline because he read a bunch of warnings about possible worsening of symptoms.  Allergies  Allergen Reactions  . Duloxetine     flushing  . Tramadol   . Dicyclomine Anxiety    Potential anxiety and GI distress?    Social Hx reviewed: refugee. MedHx, current medications and allergies reviewed.  Please see EMR. ROS: Per HPI  Objective: Office vital signs reviewed. BP 102/60   Pulse 60   Temp 98.1 F (36.7 C) (Oral)   Wt 162 lb 4.8 oz (73.6 kg)   SpO2 98%   BMI 20.84 kg/m   Physical Examination:  General: Awake, alert, thin male, No acute distress HEENT: Normal, sclera white Cardio: regular rate and rhythm, S1S2 heard, no murmurs appreciated Pulm: clear to auscultation bilaterally, no wheezes, rhonchi or rales; normal work of breathing on room air Psych: good eye contact, mood stable, does not appear to be responding to internal stimuli  Assessment/ Plan: 31 y.o. male   1. Anxiety state: I spent a good amount of time reviewing possible side effects with patient.  Though noted that the risk of these possible side effects outweighed the benefits.  I reinforced that Elavil would likely help with anxiety/ depression and help his chronic headaches, which  I believe to be related to anxiety.  Again, we reviewed the proper use of the medication.  I see that at Dr Cyndia SkeetersHensel's last visit with patient titration of medication was reviewed.  I doubt that patient will self titrate medication, as he was initially reluctant to start it entirely.  I scheduled him an appt to follow up with PCP in next few weeks.  I encouraged him to come in for reevaluation sooner if needed.  Return precautions reviewed.  Patient seemed to be much more at ease by the end of the appointment and will start the medication tonight.  2. Chronic nonintractable headache, unspecified headache type CT head recently obtained in ED.  Read was unremarkable. I reviewed this with patient, which seemed to put him at ease.  Patient to start Elavil 10mg  qhs.  Follow up with PCP prn.   Raliegh IpAshly M Shenetta Schnackenberg, DO PGY-3, [facility]

## 2016-12-16 ENCOUNTER — Emergency Department (HOSPITAL_COMMUNITY)
Admission: EM | Admit: 2016-12-16 | Discharge: 2016-12-16 | Disposition: A | Discharge status: Home / Self Care | Payer: Medicaid Other | Attending: Emergency Medicine | Admitting: Emergency Medicine

## 2016-12-16 ENCOUNTER — Emergency Department (HOSPITAL_COMMUNITY): Payer: Medicaid Other

## 2016-12-16 ENCOUNTER — Encounter (HOSPITAL_COMMUNITY): Payer: Self-pay | Admitting: Emergency Medicine

## 2016-12-16 DIAGNOSIS — M542 Cervicalgia: Secondary | ICD-10-CM | POA: Diagnosis present

## 2016-12-16 DIAGNOSIS — F17210 Nicotine dependence, cigarettes, uncomplicated: Secondary | ICD-10-CM | POA: Diagnosis not present

## 2016-12-16 DIAGNOSIS — R51 Headache: Secondary | ICD-10-CM | POA: Diagnosis not present

## 2016-12-16 DIAGNOSIS — M6281 Muscle weakness (generalized): Secondary | ICD-10-CM | POA: Insufficient documentation

## 2016-12-16 DIAGNOSIS — R29898 Other symptoms and signs involving the musculoskeletal system: Secondary | ICD-10-CM

## 2016-12-16 LAB — BASIC METABOLIC PANEL
Anion gap: 8 (ref 5–15)
BUN: 11 mg/dL (ref 6–20)
CALCIUM: 9.7 mg/dL (ref 8.9–10.3)
CO2: 27 mmol/L (ref 22–32)
Chloride: 103 mmol/L (ref 101–111)
Creatinine, Ser: 0.98 mg/dL (ref 0.61–1.24)
GFR calc Af Amer: >60 mL/min (ref >60)
GLUCOSE: 91 mg/dL (ref 65–99)
Potassium: 4.1 mmol/L (ref 3.5–5.1)
Sodium: 138 mmol/L (ref 135–145)

## 2016-12-16 LAB — CBC WITH DIFFERENTIAL/PLATELET
Basophils Absolute: 0 10*3/uL (ref 0.0–0.1)
Basophils Relative: 0 %
EOS PCT: 1 %
Eosinophils Absolute: 0.1 10*3/uL (ref 0.0–0.7)
HEMATOCRIT: 44.5 % (ref 39.0–52.0)
Hemoglobin: 15 g/dL (ref 13.0–17.0)
LYMPHS PCT: 43 %
Lymphs Abs: 3.8 10*3/uL (ref 0.7–4.0)
MCH: 31.2 pg (ref 26.0–34.0)
MCHC: 33.7 g/dL (ref 30.0–36.0)
MCV: 92.5 fL (ref 78.0–100.0)
MONO ABS: 0.5 10*3/uL (ref 0.1–1.0)
MONOS PCT: 6 %
NEUTROS ABS: 4.5 10*3/uL (ref 1.7–7.7)
Neutrophils Relative %: 50 %
PLATELETS: 174 10*3/uL (ref 150–400)
RBC: 4.81 MIL/uL (ref 4.22–5.81)
RDW: 12.6 % (ref 11.5–15.5)
WBC: 8.9 10*3/uL (ref 4.0–10.5)

## 2016-12-16 MED ORDER — IOPAMIDOL (ISOVUE-370) INJECTION 76%
INTRAVENOUS | Status: AC
  Administered 2016-12-16: 50 mL
  Filled 2016-12-16: qty 50

## 2016-12-16 NOTE — ED Provider Notes (Signed)
[facility]-EMERGENCY DEPT Provider Note   CSN: [id number] Arrival date & time: 12/16/16  0155     History   Chief Complaint Chief Complaint  Patient presents with  . Neck Pain    HPI Daniel Nelson is a 31 y.o. male who presents with c/oHeadaches, oral upper extremity weakness and paresthesia, and intermittent vision loss. He has been seen with these complaints previous. These complaints have been worse for the past 3 days. Patient states that they are worse with certain positions for instance when he places hands on the steering wheel after keeping in that position for several minutes his arms begin to go to sleep. He states it's worse when he holds his arms up and worse if he flexes his neck forward or backward. He also complains that it is difficult for him to lift his neck if he gets it into full extension or flexion. He states that occasionally when he is holding his arms up he loses vision in both of his eyes. He states that this is really affecting his life and has been difficult for him to hold a job because he has difficulty driving with these symptoms. He denies any other neurologic symptoms. He denies weight loss, soaking night sweats. He denies fevers, chills, IV drug use.  HPI  Past Medical History:  Diagnosis Date  . Headache   . Paresthesia     Patient Active Problem List   Diagnosis Date Noted  . Chronic pain syndrome 10/28/2016  . Tension-type headache, not intractable 07/09/2016  . Anxiety state 06/10/2016  . Deviated septum 03/27/2016  . Paresthesias 02/28/2016  . Chronic headaches 01/24/2016  . Epigastric pain 01/24/2016  . Myalgia 11/22/2015  . Seasonal allergies 11/11/2015  . H. pylori infection 08/17/2015  . Refugee health examination 08/01/2015  . Otalgia 08/01/2015  . H/O eye surgery 08/01/2015    Past Surgical History:  Procedure Laterality Date  . APPENDECTOMY    . EYE SURGERY Right "as a young child"       Home Medications    Prior to  Admission medications   Medication Sig Start Date End Date Taking? Authorizing Provider  ibuprofen (ADVIL,MOTRIN) 200 MG tablet Take 800 mg by mouth every 6 (six) hours as needed for mild pain.   Yes [provider]  cyclobenzaprine (FLEXERIL) 10 MG tablet Take 1 tablet (10 mg total) by mouth 2 (two) times daily as needed for muscle spasms. Patient not taking: Reported on 12/16/2016 11/27/16   Renne Crigler, PA-C    Family History Family History  Problem Relation Age of Onset  . Rheum arthritis Mother   . Healthy Father   . Colon cancer Neg Hx     Social History Social History  Substance Use Topics  . Smoking status: Current Some Day Smoker    Types: Cigarettes  . Smokeless tobacco: Never Used     Comment: tobacco info given 01/07/16  . Alcohol use No     Allergies   Duloxetine; Tramadol; and Dicyclomine   Review of Systems Review of Systems   Ten systems reviewed and are negative for acute change, except as noted in the HPI.    Physical Exam Updated Vital Signs BP 102/62 (BP Location: Left Arm)   Pulse (!) 54   Temp 98.5 F (36.9 C) (Oral)   Resp 16   SpO2 98%   Physical Exam  Constitutional: He is oriented to person, place, and time. He appears well-developed and well-nourished. No distress.  HENT:  Head:  Normocephalic and atraumatic.  Eyes: Conjunctivae are normal. No scleral icterus.  Neck: Normal range of motion. Neck supple.  Cardiovascular: Normal rate, regular rhythm and normal heart sounds.   Pulmonary/Chest: Effort normal and breath sounds normal. No respiratory distress.  Abdominal: Soft. There is no tenderness.  Musculoskeletal: He exhibits no edema.  Patient refuses to range his neck   Neurological: He is alert and oriented to person, place, and time.  Speech is clear and goal oriented, follows commands Major Cranial nerves without deficit, no facial droop Normal strength in upper and lower extremities bilaterally including dorsiflexion  and plantar flexion, strong and equal grip strength Sensation normal to light and sharp touch Moves extremities without ataxia, coordination intact Normal finger to nose and rapid alternating movements Neg romberg, no pronator drift Normal gait Normal heel-shin and balance   Skin: Skin is warm and dry. He is not diaphoretic.  Psychiatric: His behavior is normal.  Nursing note and vitals reviewed.    ED Treatments / Results  Labs (all labs ordered are listed, but only abnormal results are displayed) Labs Reviewed  CBC WITH DIFFERENTIAL/PLATELET  BASIC METABOLIC PANEL    EKG  EKG Interpretation None       Radiology Ct Angio Head W Or Wo Contrast  Result Date: 12/16/2016 CLINICAL DATA:  Bilateral upper extremity numbness and weakness. Headache extending into the neck. Loss of vision today. EXAM: CT ANGIOGRAPHY HEAD AND NECK TECHNIQUE: Multidetector CT imaging of the head and neck was performed using the standard protocol during bolus administration of intravenous contrast. Multiplanar CT image reconstructions and MIPs were obtained to evaluate the vascular anatomy. Carotid stenosis measurements (when applicable) are obtained utilizing NASCET criteria, using the distal internal carotid diameter as the denominator. CONTRAST:  50 mL Isovue 370 COMPARISON:  CT head without contrast 11/27/2016. MRA circle of Willis 02/08/2016 FINDINGS: CT HEAD FINDINGS Brain: No acute infarct, hemorrhage, or mass lesion is present. The ventricles are of normal size. No significant extraaxial fluid collection is present. Vascular: No hyperdense vessel or unexpected calcification. Skull: Normal. Negative for fracture or focal lesion. Sinuses: The paranasal sinuses and mastoid air cells are clear. Orbits: The globes and orbits are unremarkable. Review of the MIP images confirms the above findings CTA NECK FINDINGS Aortic arch: There is a common origin of the left common carotid artery in the innominate artery.  No significant atherosclerotic disease or focal stenosis is present at the aortic arch. Right carotid system: The right common carotid artery is within normal limits. Bifurcation is unremarkable. The cervical right internal carotid artery is within normal limits to the skullbase. Left carotid system: The left common carotid artery is within normal limits. The bifurcation is unremarkable. Cervical left ICA is normal. Vertebral arteries: Both vertebral arteries originate from the subclavian artery is. The upper vertebral artery is are distorted by significant patient motion. No focal stenosis or vascular injury is evident. Skeleton: Vertebral body heights and alignment are normal. No focal lytic or blastic lesions are present. Other neck: The soft tissues the neck are otherwise unremarkable. The thyroid is normal. Submandibular glands are unremarkable. No significant adenopathy is present. Upper chest: The lung apices are clear. No focal nodule, mass, or airspace disease is present. Review of the MIP images confirms the above findings CTA HEAD FINDINGS Anterior circulation: The CTA head is significantly degraded by patient motion. The timing of the contrast bolus is also off with significant venous contamination. No significant proximal stenosis or occlusion is present. The MCA bifurcations  are grossly intact. Segmental artery's are obscured by patient motion. Posterior circulation: The vertebral arteries are codominant. The vertebrobasilar junction is normal. PICA origins are obscured by motion. Both posterior cerebral arteries originate from the basilar tip. PCA branch vessels are obscured by motion. Venous sinuses: The dural sinuses are patent. The right transverse sinus is dominant. Straight sinus deep cerebral veins are intact. Cortical veins are unremarkable. Anatomic variants: None Delayed phase: The postcontrast images demonstrate no pathologic enhancement. Review of the MIP images confirms the above findings  IMPRESSION: 1. Normal CTA of the neck. 2. CT head branch vessel detail is obscured in the head by patient motion and to lesser extent delayed contrast bolus with venous contamination. 3. No significant proximal stenosis or occlusion within the circle of Willis. 4. No definite aneurysm. Electronically Signed   By: Marin Roberts M.D.   On: 12/16/2016 11:52   Ct Angio Neck W And/or Wo Contrast  Result Date: 12/16/2016 CLINICAL DATA:  Bilateral upper extremity numbness and weakness. Headache extending into the neck. Loss of vision today. EXAM: CT ANGIOGRAPHY HEAD AND NECK TECHNIQUE: Multidetector CT imaging of the head and neck was performed using the standard protocol during bolus administration of intravenous contrast. Multiplanar CT image reconstructions and MIPs were obtained to evaluate the vascular anatomy. Carotid stenosis measurements (when applicable) are obtained utilizing NASCET criteria, using the distal internal carotid diameter as the denominator. CONTRAST:  50 mL Isovue 370 COMPARISON:  CT head without contrast 11/27/2016. MRA circle of Willis 02/08/2016 FINDINGS: CT HEAD FINDINGS Brain: No acute infarct, hemorrhage, or mass lesion is present. The ventricles are of normal size. No significant extraaxial fluid collection is present. Vascular: No hyperdense vessel or unexpected calcification. Skull: Normal. Negative for fracture or focal lesion. Sinuses: The paranasal sinuses and mastoid air cells are clear. Orbits: The globes and orbits are unremarkable. Review of the MIP images confirms the above findings CTA NECK FINDINGS Aortic arch: There is a common origin of the left common carotid artery in the innominate artery. No significant atherosclerotic disease or focal stenosis is present at the aortic arch. Right carotid system: The right common carotid artery is within normal limits. Bifurcation is unremarkable. The cervical right internal carotid artery is within normal limits to the skullbase.  Left carotid system: The left common carotid artery is within normal limits. The bifurcation is unremarkable. Cervical left ICA is normal. Vertebral arteries: Both vertebral arteries originate from the subclavian artery is. The upper vertebral artery is are distorted by significant patient motion. No focal stenosis or vascular injury is evident. Skeleton: Vertebral body heights and alignment are normal. No focal lytic or blastic lesions are present. Other neck: The soft tissues the neck are otherwise unremarkable. The thyroid is normal. Submandibular glands are unremarkable. No significant adenopathy is present. Upper chest: The lung apices are clear. No focal nodule, mass, or airspace disease is present. Review of the MIP images confirms the above findings CTA HEAD FINDINGS Anterior circulation: The CTA head is significantly degraded by patient motion. The timing of the contrast bolus is also off with significant venous contamination. No significant proximal stenosis or occlusion is present. The MCA bifurcations are grossly intact. Segmental artery's are obscured by patient motion. Posterior circulation: The vertebral arteries are codominant. The vertebrobasilar junction is normal. PICA origins are obscured by motion. Both posterior cerebral arteries originate from the basilar tip. PCA branch vessels are obscured by motion. Venous sinuses: The dural sinuses are patent. The right transverse sinus is dominant. Straight  sinus deep cerebral veins are intact. Cortical veins are unremarkable. Anatomic variants: None Delayed phase: The postcontrast images demonstrate no pathologic enhancement. Review of the MIP images confirms the above findings IMPRESSION: 1. Normal CTA of the neck. 2. CT head branch vessel detail is obscured in the head by patient motion and to lesser extent delayed contrast bolus with venous contamination. 3. No significant proximal stenosis or occlusion within the circle of Willis. 4. No definite  aneurysm. Electronically Signed   By: Marin Roberts M.D.   On: 12/16/2016 11:52    Procedures Procedures (including critical care time)  Medications Ordered in ED Medications  iopamidol (ISOVUE-370) 76 % injection (50 mLs  Contrast Given 12/16/16 1111)     Initial Impression / Assessment and Plan / ED Course  I have reviewed the triage vital signs and the nursing notes.  Pertinent labs & imaging results that were available during my care of the patient were reviewed by me and considered in my medical decision making (see chart for details).  Clinical Course as of Dec 16 1345  Wed Dec 16, 2016  0912 I have discussed the case with Dr, Otelia Limes.He does not feel that there is a neurologic cause, but he may need a nerve conduction study. I question whether this is a vascular issue for instance a thoracic outlet compression versus a cord issue. I discussed the case with Dr. Clarene Duke who feels that it would be appropriate to up his vasculature with a CTA head and neck.  [AH]  1346 Patient's CT scans negative for acute abnormality. Given the patient follow up with the neurologist. Discussed return precautions. I discussed driving precautions. Patient appears safe for discharge at this time.  [AH]    Clinical Course User Index [AH] Arthor Captain, PA-C      Final Clinical Impressions(s) / ED Diagnoses   Final diagnoses:  Arm weakness    New Prescriptions Discharge Medication List as of 12/16/2016 12:18 PM       Arthor Captain, PA-C 12/19/16 1729    Little, Ambrose Finland, MD 12/20/16 [phone]

## 2016-12-16 NOTE — ED Triage Notes (Signed)
Patient reports intermittent posterior neck pain radiating to his head for several days , denies head injury , no fever /respirations unlabored , pt. added mild bilateral hands numbness this week .

## 2016-12-16 NOTE — Discharge Instructions (Signed)
We are unable to find a cause of your symptoms today. I did speak with the neurologist. He says you need to follow up with and without patient neurologist for a nerve conduction study. [facility] and he should make an appointment. He should also follow up with your primary care physician who can help you see the neurologist in follow-up if you're having difficulty driving or losing vision while driving you should not drive.  RETURN IMMEDIATELY IF you develop a sudden, severe headache or confusion, become poorly responsive or faint, develop a fever above 100.44F or problem breathing, have a change in speech, vision, swallowing, or understanding, or develop new weakness, numbness, tingling, incoordination, or have a seizure.

## 2016-12-22 ENCOUNTER — Ambulatory Visit: Payer: Medicaid Other | Admitting: Family Medicine

## 2017-01-05 ENCOUNTER — Other Ambulatory Visit: Payer: Self-pay | Admitting: Family Medicine

## 2017-01-05 ENCOUNTER — Encounter: Payer: Self-pay | Admitting: Family Medicine

## 2017-01-05 ENCOUNTER — Ambulatory Visit (INDEPENDENT_AMBULATORY_CARE_PROVIDER_SITE_OTHER): Payer: Medicaid Other | Admitting: Family Medicine

## 2017-01-05 VITALS — BP 108/70 | HR 88 | Temp 98.4°F | Ht 74.0 in | Wt 158.6 lb

## 2017-01-05 DIAGNOSIS — R51 Headache: Secondary | ICD-10-CM

## 2017-01-05 DIAGNOSIS — F329 Major depressive disorder, single episode, unspecified: Secondary | ICD-10-CM | POA: Diagnosis not present

## 2017-01-05 DIAGNOSIS — R519 Headache, unspecified: Secondary | ICD-10-CM

## 2017-01-05 MED ORDER — VENLAFAXINE HCL ER 37.5 MG PO CP24: 37.5000 mg | ORAL_CAPSULE | Freq: Every day | ORAL | 1 refills | Status: DC

## 2017-01-05 NOTE — Progress Notes (Signed)
Subjective:   [employer] intepreter Tahani [id number]:  Daniel Nelson is a 31 y.o. male who [facility] for headache:  1.  Headaches:  Multiple prior office visit and ED visits for the same.  We've had previous ongoing discussions about anxiety and depression and patient has been intermittently receptive to this. He continues to remain concerned that something worse is going on. Today he describes headache in the back of his head as well as a "burning sensation" extends around the back of his head and sometimes down his neck. No consistent triggers. Patient is currently a driver for [employer] but is only able to work 1-2 hours a day before he has trouble with anxiety, headaches, and what sounds to be some agoraphobic symptoms.  He has mentioned these psychiatric symptoms me previously. He was prescribed amitriptyline at a prior visit and had some initial relief but states that he no longer has any relief with this. No suicidal homicidal ideation. He does also describe some depression due to inability to participate in any events outside of work.   ROS as above per HPI.    The following portions of the patient's history were reviewed and updated as appropriate: allergies, current medications, past medical history, family and social history, and problem list. Patient is a nonsmoker.    PMH reviewed.  Past Medical History:  Diagnosis Date  . Headache   . Paresthesia    Past Surgical History:  Procedure Laterality Date  . APPENDECTOMY    . EYE SURGERY Right "as a young child"    Medications reviewed. Current Outpatient Prescriptions  Medication Sig Dispense Refill  . cyclobenzaprine (FLEXERIL) 10 MG tablet Take 1 tablet (10 mg total) by mouth 2 (two) times daily as needed for muscle spasms. (Patient not taking: Reported on 12/16/2016) 14 tablet 0  . ibuprofen (ADVIL,MOTRIN) 200 MG tablet Take 800 mg by mouth every 6 (six) hours as needed for mild pain.     No current  facility-administered medications for this visit.      Objective:   Physical Exam BP 108/70   Pulse 88   Temp 98.4 F (36.9 C) (Oral)   Ht 6\' 2"  (1.88 m)   Wt 158 lb 9.6 oz (71.9 kg)   SpO2 98%   BMI 20.36 kg/m  Gen:  Alert, cooperative patient who appears stated age in no acute distress.  Vital signs reviewed. HEENT: EOMI,  MMM Cardiac:  Regular rate and rhythm without murmur auscultated.  Good S1/S2. Pulm:  Clear to auscultation bilaterally with good air movement.  No wheezes or rales noted.   Psych:  Patient does become teary during discussion of inability to leave home. No hallucinations. No visual disturbances. Not currently anxious appearing. Neuro:  Completely benign.  No focal deficits throughout  No results found for this or any previous visit (from the past 72 hour(s)).

## 2017-01-05 NOTE — Patient Instructions (Signed)
It was good to see you again today.  Take the new medicine called Effexor 1 pill a day. You may notice a difference at first but this is a low dose and you will eventually begin to get used to it.  Come back and see me in 2 weeks. We will increase the dose at that visit. I will to make sure you do not have any side effects.  The counseling office will call you with an appointment next week.

## 2017-01-05 NOTE — Assessment & Plan Note (Signed)
Does indeed appear to be psychosomatic in origin. Starting Effexor today. We'll start with low-dose to prevent any side effects. Patient has fair fear of any side effects especially with psychotropic medications. He is open today for the first time to counseling and I think this would be a great option for him. Referring to psych for therapy.

## 2017-01-13 ENCOUNTER — Ambulatory Visit (HOSPITAL_COMMUNITY)
Admission: EM | Admit: 2017-01-13 | Discharge: 2017-01-13 | Disposition: A | Discharge status: Home / Self Care | Payer: Medicaid Other | Attending: Family Medicine | Admitting: Family Medicine

## 2017-01-13 ENCOUNTER — Encounter (HOSPITAL_COMMUNITY): Payer: Self-pay | Admitting: Emergency Medicine

## 2017-01-13 DIAGNOSIS — H6500 Acute serous otitis media, unspecified ear: Secondary | ICD-10-CM

## 2017-01-13 MED ORDER — NEOMYCIN-POLYMYXIN-HC 3.5-10000-1 OT SUSP: 4.0000 [drp] | Freq: Three times a day (TID) | OTIC | 0 refills | Status: DC

## 2017-01-13 MED ORDER — IPRATROPIUM BROMIDE 0.06 % NA SOLN
2.0000 | Freq: Four times a day (QID) | NASAL | 0 refills | Status: DC
Start: 2017-01-13 — End: 2017-01-13

## 2017-01-13 MED ORDER — IPRATROPIUM BROMIDE 0.06 % NA SOLN: 2.0000 | Freq: Four times a day (QID) | NASAL | 0 refills | Status: DC

## 2017-01-13 MED ORDER — AMOXICILLIN 875 MG PO TABS: 875.0000 mg | ORAL_TABLET | Freq: Two times a day (BID) | ORAL | 0 refills | Status: DC

## 2017-01-13 NOTE — ED Triage Notes (Signed)
[facility] with a complaint of a headache and bilateral ear pain x 2 days.

## 2017-01-13 NOTE — ED Provider Notes (Signed)
CSN: [id number]     Arrival date & time 01/13/17  1303 History   None    Chief Complaint  Patient presents with  . Otalgia  . Headache   (Consider location/radiation/quality/duration/timing/severity/associated sxs/prior Treatment) Patient c/o headache and bilateral ear discomfort for 2 days.  He is having ear pain and sometimes experiences some dizziness.  He has hx of OM.   The history is provided by the patient.  Otalgia  Location:  Bilateral Behind ear:  No abnormality Quality:  Aching Severity:  Moderate Onset quality:  Sudden Duration:  2 days Timing:  Constant Associated symptoms: headaches   Headache  Associated symptoms: ear pain     Past Medical History:  Diagnosis Date  . Headache   . Paresthesia    Past Surgical History:  Procedure Laterality Date  . APPENDECTOMY    . EYE SURGERY Right "as a young child"   Family History  Problem Relation Age of Onset  . Rheum arthritis Mother   . Healthy Father   . Colon cancer Neg Hx    Social History  Substance Use Topics  . Smoking status: Current Some Day Smoker    Types: Cigarettes  . Smokeless tobacco: Never Used     Comment: tobacco info given 01/07/16  . Alcohol use No    Review of Systems  Constitutional: Negative.   HENT: Positive for ear pain.   Eyes: Negative.   Respiratory: Negative.   Cardiovascular: Negative.   Gastrointestinal: Negative.   Endocrine: Negative.   Genitourinary: Negative.   Musculoskeletal: Negative.   Allergic/Immunologic: Negative.   Neurological: Positive for headaches.    Allergies  Duloxetine; Tramadol; and Dicyclomine  Home Medications   Prior to Admission medications   Medication Sig Start Date End Date Taking? Authorizing Provider  amoxicillin (AMOXIL) 875 MG tablet Take 1 tablet (875 mg total) by mouth 2 (two) times daily. 01/13/17   Deatra Canterxford, Morris Markham J, FNP  ipratropium (ATROVENT) 0.06 % nasal spray Place 2 sprays into both nostrils 4 (four) times daily. 01/13/17    Deatra Canterxford, Tallan Sandoz J, FNP  neomycin-polymyxin-hydrocortisone (CORTISPORIN) 3.5-10000-1 OTIC suspension Place 4 drops into both ears 3 (three) times daily. 01/13/17   Deatra Canterxford, Gaylan Fauver J, FNP  venlafaxine XR (EFFEXOR-XR) 37.5 MG 24 hr capsule TAKE ONE CAPSULE BY MOUTH DAILY WITH BREAKFAST. 01/05/17   Tobey GrimWalden, Jeffrey H, MD   Meds Ordered and Administered this Visit  Medications - No data to display  BP 100/64 (BP Location: Right Arm)   Pulse 62   Temp 98.4 F (36.9 C) (Oral)   Resp 18   SpO2 100%  No data found.   Physical Exam  Constitutional: He appears well-developed and well-nourished.  HENT:  Head: Normocephalic and atraumatic.  Mouth/Throat: Oropharynx is clear and moist.  Bilateral TM's erythematous  Eyes: Conjunctivae and EOM are normal. Pupils are equal, round, and reactive to light.  Neck: Normal range of motion. Neck supple.  Cardiovascular: Normal rate, regular rhythm and normal heart sounds.   Pulmonary/Chest: Effort normal and breath sounds normal.  Abdominal: Soft. Bowel sounds are normal.  Nursing note and vitals reviewed.   Urgent Care Course     Procedures (including critical care time)  Labs Review Labs Reviewed - No data to display  Imaging Review No results found.   Visual Acuity Review  Right Eye Distance:   Left Eye Distance:   Bilateral Distance:    Right Eye Near:   Left Eye Near:    Bilateral Near:  MDM   1. Acute serous otitis media, recurrence not specified, unspecified laterality    Ipratropium nasal spray Amoxicillin 875mg  one po bid x 7 days Cortisporin Otic ear gtt's  Push po fluids, rest, tylenol and motrin otc prn as directed for fever, arthralgias, and myalgias.  Follow up prn if sx's continue or persist.    Deatra Canter, FNP 01/13/17 1427

## 2017-01-26 ENCOUNTER — Emergency Department (HOSPITAL_COMMUNITY): Payer: Medicaid Other

## 2017-01-26 ENCOUNTER — Emergency Department (HOSPITAL_COMMUNITY)
Admission: EM | Admit: 2017-01-26 | Discharge: 2017-01-26 | Disposition: A | Discharge status: Home / Self Care | Payer: Medicaid Other | Attending: Emergency Medicine | Admitting: Emergency Medicine

## 2017-01-26 ENCOUNTER — Encounter (HOSPITAL_COMMUNITY): Payer: Self-pay | Admitting: Family Medicine

## 2017-01-26 DIAGNOSIS — R42 Dizziness and giddiness: Secondary | ICD-10-CM | POA: Diagnosis not present

## 2017-01-26 DIAGNOSIS — R51 Headache: Secondary | ICD-10-CM | POA: Diagnosis present

## 2017-01-26 DIAGNOSIS — M79642 Pain in left hand: Secondary | ICD-10-CM | POA: Diagnosis not present

## 2017-01-26 DIAGNOSIS — Z5321 Procedure and treatment not carried out due to patient leaving prior to being seen by health care provider: Secondary | ICD-10-CM | POA: Diagnosis not present

## 2017-01-26 NOTE — ED Notes (Addendum)
Pt gave his stickers to registration and told them he is leaving prior to being evaluated by a doctor

## 2017-01-26 NOTE — ED Triage Notes (Addendum)
Patient is complaining of headache and dizziness that started about a week ago. Patient reports he was seen at an unknown clinic for symptoms. Informed he had fluid in his ears but his symptoms are getting worse. Also, patient would like his left hand evaluated due to when he was playing soccer, he thinks he injured and it is bruised.

## 2017-02-05 ENCOUNTER — Ambulatory Visit (HOSPITAL_COMMUNITY)
Admission: EM | Admit: 2017-02-05 | Discharge: 2017-02-05 | Disposition: A | Discharge status: Home / Self Care | Payer: Medicaid Other | Attending: Internal Medicine | Admitting: Internal Medicine

## 2017-02-05 ENCOUNTER — Encounter (HOSPITAL_COMMUNITY): Payer: Self-pay | Admitting: Emergency Medicine

## 2017-02-05 DIAGNOSIS — R42 Dizziness and giddiness: Secondary | ICD-10-CM | POA: Insufficient documentation

## 2017-02-05 DIAGNOSIS — Z8261 Family history of arthritis: Secondary | ICD-10-CM | POA: Insufficient documentation

## 2017-02-05 DIAGNOSIS — R51 Headache: Secondary | ICD-10-CM | POA: Insufficient documentation

## 2017-02-05 DIAGNOSIS — H9203 Otalgia, bilateral: Secondary | ICD-10-CM | POA: Insufficient documentation

## 2017-02-05 DIAGNOSIS — R110 Nausea: Secondary | ICD-10-CM | POA: Diagnosis not present

## 2017-02-05 DIAGNOSIS — G8929 Other chronic pain: Secondary | ICD-10-CM | POA: Diagnosis not present

## 2017-02-05 DIAGNOSIS — F17210 Nicotine dependence, cigarettes, uncomplicated: Secondary | ICD-10-CM | POA: Diagnosis not present

## 2017-02-05 DIAGNOSIS — Z888 Allergy status to other drugs, medicaments and biological substances status: Secondary | ICD-10-CM | POA: Diagnosis not present

## 2017-02-05 DIAGNOSIS — M2550 Pain in unspecified joint: Secondary | ICD-10-CM | POA: Insufficient documentation

## 2017-02-05 DIAGNOSIS — R519 Headache, unspecified: Secondary | ICD-10-CM

## 2017-02-05 LAB — CBC WITH DIFFERENTIAL/PLATELET
Basophils Absolute: 0 10*3/uL (ref 0.0–0.1)
Basophils Relative: 0 %
Eosinophils Absolute: 0.1 10*3/uL (ref 0.0–0.7)
Eosinophils Relative: 1 %
HCT: 43.3 % (ref 39.0–52.0)
Hemoglobin: 14.8 g/dL (ref 13.0–17.0)
Lymphocytes Relative: 34 %
Lymphs Abs: 2.6 10*3/uL (ref 0.7–4.0)
MCH: 31.4 pg (ref 26.0–34.0)
MCHC: 34.2 g/dL (ref 30.0–36.0)
MCV: 91.9 fL (ref 78.0–100.0)
Monocytes Absolute: 0.4 10*3/uL (ref 0.1–1.0)
Monocytes Relative: 5 %
Neutro Abs: 4.7 10*3/uL (ref 1.7–7.7)
Neutrophils Relative %: 60 %
Platelets: 174 10*3/uL (ref 150–400)
RBC: 4.71 MIL/uL (ref 4.22–5.81)
RDW: 12.3 % (ref 11.5–15.5)
WBC: 7.8 10*3/uL (ref 4.0–10.5)

## 2017-02-05 LAB — COMPREHENSIVE METABOLIC PANEL
ALT: 13 U/L — ABNORMAL LOW (ref 17–63)
AST: 15 U/L (ref 15–41)
Albumin: 4.3 g/dL (ref 3.5–5.0)
Alkaline Phosphatase: 46 U/L (ref 38–126)
Anion gap: 6 (ref 5–15)
BUN: 10 mg/dL (ref 6–20)
CO2: 28 mmol/L (ref 22–32)
Calcium: 9.4 mg/dL (ref 8.9–10.3)
Chloride: 106 mmol/L (ref 101–111)
Creatinine, Ser: 0.85 mg/dL (ref 0.61–1.24)
GFR calc Af Amer: >60 mL/min (ref >60)
GFR calc non Af Amer: >60 mL/min (ref >60)
Glucose, Bld: 82 mg/dL (ref 65–99)
Potassium: 4 mmol/L (ref 3.5–5.1)
Sodium: 140 mmol/L (ref 135–145)
Total Bilirubin: 0.5 mg/dL (ref 0.3–1.2)
Total Protein: 7.6 g/dL (ref 6.5–8.1)

## 2017-02-05 LAB — VITAMIN B12: Vitamin B-12: 265 pg/mL (ref 180–914)

## 2017-02-05 LAB — SEDIMENTATION RATE: Sed Rate: 15 mm/hr (ref 0–16)

## 2017-02-05 MED ORDER — CETIRIZINE HCL 10 MG PO TABS: 10.0000 mg | ORAL_TABLET | Freq: Every day | ORAL | 0 refills | Status: DC

## 2017-02-05 MED ORDER — PREDNISONE 20 MG PO TABS: ORAL_TABLET | ORAL | 0 refills | Status: DC

## 2017-02-05 MED ORDER — ONDANSETRON HCL 4 MG PO TABS: 4.0000 mg | ORAL_TABLET | Freq: Four times a day (QID) | ORAL | 0 refills | Status: DC

## 2017-02-05 NOTE — ED Triage Notes (Signed)
[facility] a [facility] bilateral ear pain and dizziness x 5 days.

## 2017-02-05 NOTE — ED Provider Notes (Signed)
CSN: [id number]     Arrival date & time 02/05/17  1625 History   First MD Initiated Contact with Patient 02/05/17 1651     Chief Complaint  Patient presents with  . Otalgia   (Consider location/radiation/quality/duration/timing/severity/associated sxs/prior Treatment)  Language interpreter used throughout exam. Interpreter # [phone]  HPI  Daniel Nelson is a 31 y.o. male presenting to UC with c/o recurrent episode of chronic symptoms including bilateral ear pain, generalized headache, intermittent dizziness, nausea, and joint pain.  He has been seen by his PCP as well as a specialist for same. He was seen here as well. He was prescribed medication for potential ear infection during last UC visit and was prescribed a depression medication by his PCP but states he does not feel depressed.  Pt requesting labs for vitamin B12.  Denies hx of DM, thyroid problems or hx of RA.     Past Medical History:  Diagnosis Date  . Headache   . Paresthesia    Past Surgical History:  Procedure Laterality Date  . APPENDECTOMY    . EYE SURGERY Right "as a young child"   Family History  Problem Relation Age of Onset  . Rheum arthritis Mother   . Healthy Father   . Colon cancer Neg Hx    Social History  Substance Use Topics  . Smoking status: Current Some Day Smoker    Types: Cigarettes  . Smokeless tobacco: Never Used     Comment: tobacco info given 01/07/16  . Alcohol use No    Review of Systems  Constitutional: Negative for chills and fever.  HENT: Positive for ear pain. Negative for congestion, sore throat, trouble swallowing and voice change.   Eyes: Positive for visual disturbance ( intermittent).  Respiratory: Negative for cough and shortness of breath.   Cardiovascular: Negative for chest pain and palpitations.  Gastrointestinal: Positive for nausea. Negative for abdominal pain, diarrhea and vomiting.  Musculoskeletal: Positive for arthralgias and myalgias. Negative for back  pain.  Skin: Negative for rash.  Neurological: Positive for dizziness, light-headedness and headaches. Negative for seizures, syncope, facial asymmetry, weakness and numbness.    Allergies  Duloxetine; Tramadol; and Dicyclomine  Home Medications   Prior to Admission medications   Medication Sig Start Date End Date Taking? Authorizing Provider  cetirizine (ZYRTEC) 10 MG tablet Take 1 tablet (10 mg total) by mouth daily. 02/05/17   Lurene ShadowPhelps, Alonte Wulff O, PA-C  ipratropium (ATROVENT) 0.06 % nasal spray Place 2 sprays into both nostrils 4 (four) times daily. 01/13/17   Deatra Canterxford, William J, FNP  neomycin-polymyxin-hydrocortisone (CORTISPORIN) 3.5-10000-1 OTIC suspension Place 4 drops into both ears 3 (three) times daily. 01/13/17   Deatra Canterxford, William J, FNP  ondansetron (ZOFRAN) 4 MG tablet Take 1 tablet (4 mg total) by mouth every 6 (six) hours. 02/05/17   Lurene ShadowPhelps, Denzil Bristol O, PA-C  predniSONE (DELTASONE) 20 MG tablet 3 tabs po day one, then 2 po daily x 4 days 02/05/17   Lurene ShadowPhelps, Amisha Pospisil O, PA-C  venlafaxine XR (EFFEXOR-XR) 37.5 MG 24 hr capsule TAKE ONE CAPSULE BY MOUTH DAILY WITH BREAKFAST. 01/05/17   Tobey GrimWalden, Jeffrey H, MD   Meds Ordered and Administered this Visit  Medications - No data to display  BP (!) 100/58 (BP Location: Right Arm)   Pulse (!) 57   Temp 98.4 F (36.9 C) (Oral)   Resp 16   SpO2 99%  No data found.   Physical Exam  Constitutional: He is oriented to person, place, and time. He appears  well-developed and well-nourished.  HENT:  Head: Normocephalic and atraumatic.  Eyes: EOM are normal.  Neck: Normal range of motion.  Cardiovascular: Normal rate.   Pulmonary/Chest: Effort normal.  Musculoskeletal: Normal range of motion.  Neurological: He is alert and oriented to person, place, and time.  Skin: Skin is warm and dry.  Psychiatric: He has a normal mood and affect. His behavior is normal.  Nursing note and vitals reviewed.   Urgent Care Course     Procedures (including critical  care time)  Labs Review Labs Reviewed  COMPREHENSIVE METABOLIC PANEL - Abnormal; Notable for the following:       Result Value   ALT 13 (*)    All other components within normal limits  CBC WITH DIFFERENTIAL/PLATELET  VITAMIN B12  SEDIMENTATION RATE    Imaging Review No results found.  MDM   1. Chronic ear pain, bilateral   2. Dizziness   3. Arthralgia, unspecified joint   4. Generalized headache   5. Nausea without vomiting    Pt presenting to UC with multiple chronic complaints.   Labs: CBC, CMP, Sed rate and B12.   Rx: Prednisone, cetirizine, and zofran Encouraged f/u with PCP for ongoing care for chronic recurrent symptoms.    Lurene Shadow, [place] 02/05/17 [phone]

## 2017-03-01 ENCOUNTER — Ambulatory Visit (HOSPITAL_COMMUNITY)
Admission: EM | Admit: 2017-03-01 | Discharge: 2017-03-01 | Disposition: A | Discharge status: Home / Self Care | Payer: Medicaid Other | Attending: Family Medicine | Admitting: Family Medicine

## 2017-03-01 ENCOUNTER — Encounter (HOSPITAL_COMMUNITY): Payer: Self-pay | Admitting: Emergency Medicine

## 2017-03-01 DIAGNOSIS — F411 Generalized anxiety disorder: Secondary | ICD-10-CM

## 2017-03-01 MED ORDER — AMITRIPTYLINE HCL 25 MG PO TABS: 25.0000 mg | ORAL_TABLET | Freq: Every day | ORAL | 1 refills | Status: DC

## 2017-03-01 NOTE — ED Triage Notes (Signed)
Complains of dizziness and feeling tired for a month.  Complains of left side of head pain

## 2017-03-03 NOTE — ED Provider Notes (Signed)
  [facility]   [id number] 03/01/17 Arrival Time: 1158  ASSESSMENT & PLAN:  1. Anxiety state     Meds ordered this encounter  Medications  . amitriptyline (ELAVIL) 25 MG tablet    Sig: Take 1 tablet (25 mg total) by mouth at bedtime.    Dispense:  30 tablet    Refill:  1   I agree with him that his anxiety may be causing his various symptomatology. Will re-start on Elavil at 25mg . Recommend that he schedule f/u with his PCP for 3-4 weeks. May f/u here if needed.  Reviewed expectations re: course of current medical issues. Questions answered. Outlined signs and symptoms indicating need for more acute intervention. Patient verbalized understanding. After Visit Summary given.   SUBJECTIVE: Arabic interpreter used.  Daniel Nelson is a 31 y.o. male who presents with complaint of overall fatigue for the past month. Seen previously for the same symptoms with negative workups. Has periods when he feels much better but reports frequent return of fatigue. "Very stressed" and feels this is likely causing his symptoms. Occasional headaches and lightheaded feeling. Both non-limiting. Previously on Elavil 10mg  for two weeks. Went to f/u and reports provider changing medication to Effexor which did not work well secondary to side effects; "felt worse". Self-discontinued. No f/u since. No suicidal thoughts or ideations.  ROS: As per HPI.   OBJECTIVE:  Vitals:   03/01/17 1309  BP: 113/71  Pulse: (!) 59  Resp: 18  Temp: 98.6 F (37 C)  TempSrc: Oral  SpO2: 100%     General appearance: alert; no distress Neck: supple Lungs: clear to auscultation bilaterally Heart: regular rate and rhythm Back: no CVA tenderness Extremities: no cyanosis or edema; symmetrical with no gross deformities Skin: warm and dry Neurologic: normal symmetric reflexes; normal gait Psychological: alert and cooperative; normal mood and affect    Allergies  Allergen Reactions  . Duloxetine       flushing  . Tramadol Other (See Comments)    unknown  . Dicyclomine Anxiety    Potential anxiety and GI distress?    PMHx, SurgHx, SocialHx, Medications, and Allergies were reviewed in the Visit Navigator and updated as appropriate.      Mardella LaymanHagler, Daniel Baena, MD 03/03/17 [phone]

## 2017-04-09 ENCOUNTER — Ambulatory Visit (INDEPENDENT_AMBULATORY_CARE_PROVIDER_SITE_OTHER): Payer: Medicaid Other | Admitting: Family Medicine

## 2017-04-09 ENCOUNTER — Encounter: Payer: Self-pay | Admitting: Family Medicine

## 2017-04-09 VITALS — BP 100/60 | HR 59 | Temp 98.0°F | Ht 74.0 in | Wt 161.4 lb

## 2017-04-09 DIAGNOSIS — F4000 Agoraphobia, unspecified: Secondary | ICD-10-CM | POA: Diagnosis not present

## 2017-04-09 DIAGNOSIS — F411 Generalized anxiety disorder: Secondary | ICD-10-CM | POA: Diagnosis not present

## 2017-04-09 MED ORDER — LORAZEPAM 0.5 MG PO TABS: 0.5000 mg | ORAL_TABLET | Freq: Every day | ORAL | 0 refills | Status: DC | PRN

## 2017-04-09 NOTE — Patient Instructions (Signed)
It was good to see you again today.  Take the Lorazepam if you need it for anxiety.  I will refer you to a psychologist as well.

## 2017-04-09 NOTE — Assessment & Plan Note (Signed)
Think this is the cause of much of the symptoms. Patient today agrees with this. We did discuss multiple treatments. He does have his meloxicam but has not been taking it because it does not help with the symptoms and because he is afraid to take any medicine. After talking with him he states that he only takes his loxapine once and did not take any after that because he did not notice any symptoms changes. Plan today is to start low-dose lorazepam take as needed. We'll introduce venlafaxine as a daily medicine after that. He is very hesitant to start any medicines at all. -Did discuss that the best way to treat this was combination of psychological counseling plus medication. He is very open to psychotherapy, much more so the medication use. -Referral to psychology today. Follow-up in 2 weeks to assess for improvement.

## 2017-04-09 NOTE — Progress Notes (Signed)
Subjective:   [employer] interpreter Marwa [phone] used entire visit:  Daniel Nelson is a 31 y.o. male who presents to [facility] today for anxiety:  1.  Anxiety:  Reports today with concerns for anxiety.  Mom present today and concnered he may have PTSD from his life in [place] before being resettled in the [place].   Describes daily anxiety and fear of leaving the house.  Generalized clear without any specific phobia. Describes being nervous almost the entire day. Has trouble sleeping at night due to symptoms. Occasionally has palpitations and shortness of breath. Has headaches and feeling of "melting" when driving. He tries not to drive if possible. He and his wife is completely rearranged their schedules such that she is going to take the children to work.  His mother states that often he will leave for work and then come back 15-20 minutes later saying he is unable to drive to work. Denies any nightmares. No flashbacks.   No suicidal or homicidal ideation. Some decreased eating.    ROS as above per HPI.   The following portions of the patient's history were reviewed and updated as appropriate: allergies, current medications, past medical history, family and social history, and problem list. Patient is a nonsmoker.    PMH reviewed.  Past Medical History:  Diagnosis Date  . Headache   . Paresthesia    Past Surgical History:  Procedure Laterality Date  . APPENDECTOMY    . EYE SURGERY Right "as a young child"    Medications reviewed. Current Outpatient Prescriptions  Medication Sig Dispense Refill  . amitriptyline (ELAVIL) 25 MG tablet Take 1 tablet (25 mg total) by mouth at bedtime. 30 tablet 1  . cetirizine (ZYRTEC) 10 MG tablet Take 1 tablet (10 mg total) by mouth daily. 30 tablet 0  . ipratropium (ATROVENT) 0.06 % nasal spray Place 2 sprays into both nostrils 4 (four) times daily. 15 mL 0  . neomycin-polymyxin-hydrocortisone (CORTISPORIN) 3.5-10000-1 OTIC suspension Place 4 drops into  both ears 3 (three) times daily. 10 mL 0  . ondansetron (ZOFRAN) 4 MG tablet Take 1 tablet (4 mg total) by mouth every 6 (six) hours. 12 tablet 0  . predniSONE (DELTASONE) 20 MG tablet 3 tabs po day one, then 2 po daily x 4 days 11 tablet 0  . venlafaxine XR (EFFEXOR-XR) 37.5 MG 24 hr capsule TAKE ONE CAPSULE BY MOUTH DAILY WITH BREAKFAST. 90 capsule 1   No current facility-administered medications for this visit.      Objective:   Physical Exam BP 100/60   Pulse (!) 59   Temp 98 F (36.7 C) (Oral)   Ht  (1.88 m)   Wt 161 lb 6.4 oz (73.2 kg)   SpO2 99%   BMI 20.72 kg/m  Gen:  Alert, cooperative patient who appears stated age in no acute distress.  Vital signs reviewed. Psych:  Somewhat anxious appearing.  No hallucinations.  Not depressed appearing.    Face-time with patient:  25 minutes discussing his concerns and developing plan through interpretation.

## 2017-04-23 ENCOUNTER — Telehealth: Payer: Self-pay | Admitting: Family Medicine

## 2017-04-23 NOTE — Telephone Encounter (Signed)
Needs Naproxen sodium and Amitryptiline refilled to [facility] pharmacy (could not read label, use one in chart).  [phone] for questions.

## 2017-04-26 MED ORDER — AMITRIPTYLINE HCL 25 MG PO TABS: 25.0000 mg | ORAL_TABLET | Freq: Every day | ORAL | 1 refills | Status: DC

## 2017-04-26 MED ORDER — NAPROXEN 500 MG PO TABS: 500.0000 mg | ORAL_TABLET | Freq: Two times a day (BID) | ORAL | 1 refills | Status: DC

## 2017-04-26 NOTE — Telephone Encounter (Signed)
Done

## 2017-04-30 ENCOUNTER — Ambulatory Visit (HOSPITAL_COMMUNITY)
Admission: EM | Admit: 2017-04-30 | Discharge: 2017-04-30 | Disposition: A | Discharge status: Home / Self Care | Payer: Medicaid Other | Attending: Family Medicine | Admitting: Family Medicine

## 2017-04-30 ENCOUNTER — Encounter (HOSPITAL_COMMUNITY): Payer: Self-pay | Admitting: Emergency Medicine

## 2017-04-30 DIAGNOSIS — M542 Cervicalgia: Secondary | ICD-10-CM | POA: Diagnosis not present

## 2017-04-30 DIAGNOSIS — T148XXA Other injury of unspecified body region, initial encounter: Secondary | ICD-10-CM | POA: Diagnosis not present

## 2017-04-30 DIAGNOSIS — M791 Myalgia, unspecified site: Secondary | ICD-10-CM

## 2017-04-30 DIAGNOSIS — G44209 Tension-type headache, unspecified, not intractable: Secondary | ICD-10-CM | POA: Diagnosis not present

## 2017-04-30 MED ORDER — NAPROXEN 500 MG PO TABS: 500.0000 mg | ORAL_TABLET | Freq: Two times a day (BID) | ORAL | 0 refills | Status: DC

## 2017-04-30 MED ORDER — ONDANSETRON HCL 4 MG PO TABS: 4.0000 mg | ORAL_TABLET | Freq: Four times a day (QID) | ORAL | 0 refills | Status: DC

## 2017-04-30 NOTE — ED Triage Notes (Signed)
Pt c/o neck pain onset 5 days associated w/bilateral ear pain, ST, dizziness, fevers  A&O x4... NAD... Ambulatory

## 2017-04-30 NOTE — Discharge Instructions (Signed)
Many of the complaints that you have expressed a are chronic and are currently being treated by your primary care provider. Follow-up with your doctor for continued problems. In the meantime take the Naprosyn with food twice a day for neck pain. Apply heat to the area of pain. He may take Zofran for nausea. For drainage in your throat he may take Claritin or Allegra.

## 2017-04-30 NOTE — ED Provider Notes (Signed)
[facility]    CSN: [id number] Arrival date & time: 04/30/17  1404     History   Chief Complaint Chief Complaint  Patient presents with  . Neck Pain    HPI Daniel Nelson is a 31 y.o. male.   Patient speaks Arabic, video interpreter used. Note that the Vioxx with her states that he also has a little hard time understanding him because of his dialect. Chief complaint seems to be posterior neck pain that radiates across the bilateral shoulders, radiating to the occiput and to the forehead. This started about 6 days ago. He has a vague description of feeling weak sometimes. Occasionally he itches all over. He has a left earache, mild sore throat and possible fever described as head feeling hot. The neck pain and head pain is worse with head movement. Sometimes his arms and legs feel weak all over. 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 He has a history of headache, chronic pain syndrome, tension type headache, anxiety, paresthesias, myalgias and otalgia. These are similar to his complaints today. Headache is described as both sharp and burning. Episodic. Turning head makes it worse. Nothing seems to make it better. Sore throat described as scratchy. Occurring intermittently for one to 2 weeks.      Past Medical History:  Diagnosis Date  . Headache   . Paresthesia     Patient Active Problem List   Diagnosis Date Noted  . Chronic pain syndrome 10/28/2016  . Tension-type headache, not intractable 07/09/2016  . Anxiety state 06/10/2016  . Deviated septum 03/27/2016  . Paresthesias 02/28/2016  . Chronic headaches 01/24/2016  . Epigastric pain 01/24/2016  . Myalgia 11/22/2015  . Seasonal allergies 11/11/2015  . H. pylori infection 08/17/2015  . Otalgia 08/01/2015  . H/O eye surgery 08/01/2015    Past Surgical History:  Procedure Laterality Date  . APPENDECTOMY    . EYE  SURGERY Right "as a young child"       Home Medications    Prior to Admission medications   Medication Sig Start Date End Date Taking? Authorizing Provider  amitriptyline (ELAVIL) 25 MG tablet Take 1 tablet (25 mg total) by mouth at bedtime. 04/26/17   Tobey Grim, MD  cetirizine (ZYRTEC) 10 MG tablet Take 1 tablet (10 mg total) by mouth daily. 02/05/17   Lurene Shadow, PA-C  ipratropium (ATROVENT) 0.06 % nasal spray Place 2 sprays into both nostrils 4 (four) times daily. 01/13/17   Deatra Canter, FNP  LORazepam (ATIVAN) 0.5 MG tablet Take 1 tablet (0.5 mg total) by mouth daily as needed for anxiety. 04/09/17   Tobey Grim, MD  naproxen (NAPROSYN) 500 MG tablet Take 1 tablet (500 mg total) by mouth 2 (two) times daily. Prn neck and head pain 04/30/17   Hayden Rasmussen, NP  neomycin-polymyxin-hydrocortisone (CORTISPORIN) 3.5-10000-1 OTIC suspension Place 4 drops into both ears 3 (three) times daily. 01/13/17   Deatra Canter, FNP  ondansetron (ZOFRAN) 4 MG tablet Take 1 tablet (4 mg total) by mouth every 6 (six) hours. Prn nausea 04/30/17   Hayden Rasmussen, NP  predniSONE (DELTASONE) 20 MG tablet 3 tabs po day one, then 2 po daily x 4 days 02/05/17   Lurene Shadow, PA-C  venlafaxine XR (EFFEXOR-XR) 37.5 MG 24 hr capsule TAKE ONE CAPSULE BY MOUTH DAILY WITH BREAKFAST. 01/05/17   Tobey Grim, MD  Family History Family History  Problem Relation Age of Onset  . Rheum arthritis Mother   . Healthy Father   . Colon cancer Neg Hx     Social History Social History  Substance Use Topics  . Smoking status: Current Some Day Smoker    Types: Cigarettes  . Smokeless tobacco: Never Used     Comment: tobacco info given 01/07/16  . Alcohol use No     Allergies   Duloxetine; Tramadol; and Dicyclomine   Review of Systems Review of Systems  Constitutional: Negative.  Negative for activity change, fatigue and fever.  HENT: Positive for postnasal drip. Negative for congestion.     Eyes: Positive for visual disturbance.  Respiratory: Negative.   Cardiovascular: Negative.   Gastrointestinal: Positive for nausea.  Musculoskeletal: Positive for myalgias.  Skin: Negative.   Neurological: Positive for headaches.  Psychiatric/Behavioral: The patient is nervous/anxious.   All other systems reviewed and are negative.    Physical Exam Triage Vital Signs ED Triage Vitals  Enc Vitals Group     BP 04/30/17 1415 116/76     Pulse Rate 04/30/17 1415 61     Resp 04/30/17 1415 20     Temp 04/30/17 1415 98.1 F (36.7 C)     Temp Source 04/30/17 1415 Oral     SpO2 04/30/17 1415 100 %     Weight --      Height --      Head Circumference --      Peak Flow --      Pain Score 04/30/17 1416 6     Pain Loc --      Pain Edu? --      Excl. in GC? --    No data found.   Updated Vital Signs BP 116/76 (BP Location: Left Arm)   Pulse 61   Temp 98.1 F (36.7 C) (Oral)   Resp 20   SpO2 100%   Visual Acuity Right Eye Distance:   Left Eye Distance:   Bilateral Distance:    Right Eye Near:   Left Eye Near:    Bilateral Near:     Physical Exam  Constitutional: He appears well-developed and well-nourished. No distress.  HENT:  Head: Normocephalic and atraumatic.  Nose: Nose normal.  Bilateral TMs are normal. No erythema, retraction, bulging.  Oropharynx with cobblestoning and clear PND. No erythema, exudates or swelling.  Eyes: EOM are normal.  Neck: Normal range of motion. Neck supple.  Demonstrates full range of motion of head and neck however with some pain in the bilateral trapezii. There is tenderness in the bilateral trapezii muscles as well as the insertion points to the back of the neck and occiput. Tender to the temporoparietal scalp and forehead musculature.  Cardiovascular: Normal rate, regular rhythm and normal heart sounds.   Pulmonary/Chest: Effort normal and breath sounds normal. No respiratory distress.  Musculoskeletal: Normal range of motion. He  exhibits no edema.  Despite the complaint of having various intermittent episodes of weakness in the arms and legs he demonstrates strength 5 over 5 bilaterally in the upper and lower extremities.  Neurological: He is alert. No cranial nerve deficit. Coordination normal.  Skin: Skin is warm and dry.  Psychiatric: He has a normal mood and affect.  Nursing note and vitals reviewed.    UC Treatments / Results  Labs (all labs ordered are listed, but only abnormal results are displayed) Labs Reviewed - No data to display  EKG  EKG Interpretation None  Radiology No results found.  Procedures Procedures (including critical care time)  Medications Ordered in UC Medications - No data to display   Initial Impression / Assessment and Plan / UC Course  I have reviewed the triage vital signs and the nursing notes.  Pertinent labs & imaging results that were available during my care of the patient were reviewed by me and considered in my medical decision making (see chart for details).       Final Clinical Impressions(s) / UC Diagnoses   Final diagnoses:  Neck pain  Muscle strain  Myalgia  Tension headache    New Prescriptions New Prescriptions   NAPROXEN (NAPROSYN) 500 MG TABLET    Take 1 tablet (500 mg total) by mouth 2 (two) times daily. Prn neck and head pain   ONDANSETRON (ZOFRAN) 4 MG TABLET    Take 1 tablet (4 mg total) by mouth every 6 (six) hours. Prn nausea     Controlled Substance Prescriptions [place] Controlled Substance Registry consulted? Not Applicable   Hayden Rasmussen, NP 04/30/17 1451

## 2017-05-03 ENCOUNTER — Ambulatory Visit (INDEPENDENT_AMBULATORY_CARE_PROVIDER_SITE_OTHER): Payer: Medicaid Other | Admitting: Internal Medicine

## 2017-05-03 ENCOUNTER — Encounter: Payer: Self-pay | Admitting: Internal Medicine

## 2017-05-03 DIAGNOSIS — R42 Dizziness and giddiness: Secondary | ICD-10-CM

## 2017-05-03 NOTE — Progress Notes (Deleted)
   Subjective:   Patient: Daniel Nelson       Birthdate: 07-27-1985       MRN: [id number]      HPI  [name] Kristjan Derner is a 31 y.o. male presenting for same day appointment for ***.   ***  Smoking status reviewed. Patient is current some day smoker.   Review of Systems See HPI.     Objective:  Physical Exam       Assessment & Plan:  No problem-specific Assessment & Plan notes found for this encounter.   Precepted with Dr. Marland Kitchen   Tarri Abernethy, MD, MPH PGY-3 [facility] Pager [phone]

## 2017-05-03 NOTE — Progress Notes (Signed)
Encounter opened when patient initially arrived, however patient left and did not return for appointment.   Daniel Abernethy, MD, MPH PGY-3 [facility] Pager [phone]

## 2017-05-04 ENCOUNTER — Ambulatory Visit: Payer: Medicaid Other | Admitting: Family Medicine

## 2017-05-04 NOTE — Progress Notes (Deleted)
   Subjective:   Patient ID: Daniel Nelson    DOB: 12-14-1985, 31 y.o. male   MRN: [id number]  Daniel Nelson is a 31 y.o. male with a history of allergies, chronic headaches, anxiety here for   Headache - several office and ED visits for this prior. Noted anxiety and depression to be contributing, previously on Elavil , naproxen , venlafaxine xr 37.5mg  with recent referral to Psychiatry in 12/2016 and Psychology 2 weeks ago. Loxapine took only once b/c he felt it didn't help his symptoms. Started low dose lorazepam PRN - Also has h/o AOM - Description of pain: {headache description:315282}.  - Duration of individual headaches: *** {gen duration:315003}, frequency {gen frequencies:315330}. - Associated symptoms: {hx headache assoc sx:315274}.  - Pain relief: {headache relief:315277}. Precipitating factors: {headache precipitating factors:315279}. - He {has/denies:315300::"denies"} a history of recent head injury.  - Prior neurological history: negative for {neuro past hx:315716}. - Neurologic Review of Systems - {cns ros:315740}.  Healthcare Maintenance - Vaccines: flu, tetanus  Review of Systems:  Per HPI.   PMFSH: reviewed. Smoking status reviewed. Medications reviewed.  Objective:   There were no vitals taken for this visit. Vitals and nursing note reviewed.  General: well nourished, well developed, in no acute distress with non-toxic appearance HEENT: normocephalic, atraumatic, moist mucous membranes Neck: supple, non-tender without lymphadenopathy CV: regular rate and rhythm without murmurs, rubs, or gallops, no lower extremity edema Lungs: clear to auscultation bilaterally with normal work of breathing Abdomen: soft, non-tender, non-distended, no masses or organomegaly palpable, normoactive bowel sounds Skin: warm, dry, no rashes or lesions, cap refill < 2 seconds Extremities: warm and well perfused, normal tone MSK: Full ROM, strength intact, gait  normal Neuro: Alert and oriented, speech normal  Assessment & Plan:   No problem-specific Assessment & Plan notes found for this encounter.  No orders of the defined types were placed in this encounter.  No orders of the defined types were placed in this encounter.   Ellwood Dense, DO PGY-1, [facility] 05/04/2017 7:39 AM

## 2017-05-05 ENCOUNTER — Ambulatory Visit (HOSPITAL_COMMUNITY)
Admission: EM | Admit: 2017-05-05 | Discharge: 2017-05-05 | Disposition: A | Discharge status: Home / Self Care | Payer: Medicaid Other | Attending: Family Medicine | Admitting: Family Medicine

## 2017-05-05 ENCOUNTER — Encounter (HOSPITAL_COMMUNITY): Payer: Self-pay | Admitting: Emergency Medicine

## 2017-05-05 DIAGNOSIS — G44209 Tension-type headache, unspecified, not intractable: Secondary | ICD-10-CM | POA: Diagnosis not present

## 2017-05-05 MED ORDER — CETIRIZINE HCL 10 MG PO TABS: 10.0000 mg | ORAL_TABLET | Freq: Every day | ORAL | 0 refills | Status: DC

## 2017-05-05 MED ORDER — METHOCARBAMOL 500 MG PO TABS: 500.0000 mg | ORAL_TABLET | Freq: Two times a day (BID) | ORAL | 0 refills | Status: DC

## 2017-05-05 NOTE — ED Triage Notes (Signed)
Pt c/o headache bilateral ear pain, dizziness x5 days.

## 2017-05-05 NOTE — ED Provider Notes (Signed)
[facility]    CSN: [id number] Arrival date & time: 05/05/17  1722     History   Chief Complaint Chief Complaint  Patient presents with  . Headache    HPI Daniel Nelson is a 31 y.o. male.   Arabic video interpreter used to collect history and physical. Patient presents with complaints of headache which has been ongoing for the past 5 days. He states this is a different headache than he has had in the past. It originates at his posterior head near neck and radiates to forehead, is burning or electric in nature. He feels he has ear pressure as well. At times feels off balance. No known injury. No known fevers, at times feels he has a sore throat. Feels he has blurry vision at times. Denies known ill contacts. Without cough or runny nose. The pain comes and goes. He states he tried ibuprofen which did not help, has not taken since yesterday. Without nausea or vomiting.       Past Medical History:  Diagnosis Date  . Headache   . Paresthesia     Patient Active Problem List   Diagnosis Date Noted  . Chronic pain syndrome 10/28/2016  . Tension-type headache, not intractable 07/09/2016  . Anxiety state 06/10/2016  . Deviated septum 03/27/2016  . Paresthesias 02/28/2016  . Chronic headaches 01/24/2016  . Epigastric pain 01/24/2016  . Myalgia 11/22/2015  . Seasonal allergies 11/11/2015  . H. pylori infection 08/17/2015  . Otalgia 08/01/2015  . H/O eye surgery 08/01/2015    Past Surgical History:  Procedure Laterality Date  . APPENDECTOMY    . EYE SURGERY Right "as a young child"       Home Medications    Prior to Admission medications   Medication Sig Start Date End Date Taking? Authorizing Provider  amitriptyline (ELAVIL) 25 MG tablet Take 1 tablet (25 mg total) by mouth at bedtime. 04/26/17   Tobey Grim, MD  cetirizine (ZYRTEC) 10 MG tablet Take 1 tablet (10 mg total) by mouth daily. 05/05/17   Linus Mako B, NP  ipratropium  (ATROVENT) 0.06 % nasal spray Place 2 sprays into both nostrils 4 (four) times daily. 01/13/17   Deatra Canter, FNP  LORazepam (ATIVAN) 0.5 MG tablet Take 1 tablet (0.5 mg total) by mouth daily as needed for anxiety. 04/09/17   Tobey Grim, MD  methocarbamol (ROBAXIN) 500 MG tablet Take 1 tablet (500 mg total) by mouth 2 (two) times daily. 05/05/17   Georgetta Haber, NP  naproxen (NAPROSYN) 500 MG tablet Take 1 tablet (500 mg total) by mouth 2 (two) times daily. Prn neck and head pain 04/30/17   Hayden Rasmussen, NP  neomycin-polymyxin-hydrocortisone (CORTISPORIN) 3.5-10000-1 OTIC suspension Place 4 drops into both ears 3 (three) times daily. 01/13/17   Deatra Canter, FNP  ondansetron (ZOFRAN) 4 MG tablet Take 1 tablet (4 mg total) by mouth every 6 (six) hours. Prn nausea 04/30/17   Hayden Rasmussen, NP  predniSONE (DELTASONE) 20 MG tablet 3 tabs po day one, then 2 po daily x 4 days 02/05/17   Lurene Shadow, PA-C  venlafaxine XR (EFFEXOR-XR) 37.5 MG 24 hr capsule TAKE ONE CAPSULE BY MOUTH DAILY WITH BREAKFAST. 01/05/17   Tobey Grim, MD    Family History Family History  Problem Relation Age of Onset  . Rheum arthritis Mother   . Healthy Father   . Colon cancer Neg Hx     Social History Social History  Substance Use Topics  . Smoking status: Current Some Day Smoker    Types: Cigarettes  . Smokeless tobacco: Never Used     Comment: tobacco info given 01/07/16  . Alcohol use No     Allergies   Duloxetine; Tramadol; and Dicyclomine   Review of Systems Review of Systems  Constitutional: Negative.   HENT: Positive for ear pain and sore throat.   Eyes:       Blurry vision  Respiratory: Negative.   Gastrointestinal: Negative.   Musculoskeletal: Positive for neck pain.  Neurological: Positive for headaches.     Physical Exam Triage Vital Signs ED Triage Vitals  Enc Vitals Group     BP 05/05/17 1750 112/70     Pulse Rate 05/05/17 1750 65     Resp 05/05/17 1750 16      Temp 05/05/17 1750 99.8 F (37.7 C)     Temp src --      SpO2 05/05/17 1750 99 %     Weight --      Height --      Head Circumference --      Peak Flow --      Pain Score 05/05/17 1752 10     Pain Loc --      Pain Edu? --      Excl. in GC? --    No data found.   Updated Vital Signs BP 112/70   Pulse 65   Temp 99.8 F (37.7 C)   Resp 16   SpO2 99%   Visual Acuity Right Eye Distance:   Left Eye Distance:   Bilateral Distance:    Right Eye Near:   Left Eye Near:    Bilateral Near:     Physical Exam  Constitutional: He is oriented to person, place, and time. He appears well-developed and well-nourished.  HENT:  Head: Normocephalic and atraumatic.  Right Ear: Tympanic membrane, external ear and ear canal normal.  Left Ear: Tympanic membrane, external ear and ear canal normal.  Eyes: Pupils are equal, round, and reactive to light.  Neck: Normal range of motion. Neck supple.  Cardiovascular: Normal rate, regular rhythm and normal heart sounds.   Pulmonary/Chest: Effort normal and breath sounds normal.  Musculoskeletal: Normal range of motion.  Neurological: He is alert and oriented to person, place, and time. No cranial nerve deficit or sensory deficit. He exhibits normal muscle tone. Coordination normal.  Skin: Skin is warm and dry.  Vitals reviewed.    UC Treatments / Results  Labs (all labs ordered are listed, but only abnormal results are displayed) Labs Reviewed - No data to display  EKG  EKG Interpretation None       Radiology No results found.  Procedures Procedures (including critical care time)  Medications Ordered in UC Medications - No data to display   Initial Impression / Assessment and Plan / UC Course  I have reviewed the triage vital signs and the nursing notes.  Pertinent labs & imaging results that were available during my care of the patient were reviewed by me and considered in my medical decision making (see chart for  details).     Without acute neurological or HEENT findings on exam. Symptoms consistent with tension headache. It appears patient has been seen with similar symptoms on other occasion.  Recommended trying robaxin, not to take if driving as may cause drowsiness. May try to restart allergy medication for ear fullness. Ibuprofen as needed, take with food. If symptoms worsen, develop  weakness, loss of consciousness return to be seen. Encouraged follow up and recheck with PCP next week. Patient verbalized understanding of instructions.   Final Clinical Impressions(s) / UC Diagnoses   Final diagnoses:  Tension headache    New Prescriptions Discharge Medication List as of 05/05/2017  6:40 PM       Controlled Substance Prescriptions [place] Controlled Substance Registry consulted? Not Applicable   Georgetta Haber, NP 05/05/17 1958

## 2017-05-09 ENCOUNTER — Encounter (HOSPITAL_COMMUNITY): Payer: Self-pay | Admitting: Emergency Medicine

## 2017-05-09 ENCOUNTER — Emergency Department (HOSPITAL_COMMUNITY)
Admission: EM | Admit: 2017-05-09 | Discharge: 2017-05-09 | Disposition: A | Discharge status: Home / Self Care | Payer: Medicaid Other | Attending: Emergency Medicine | Admitting: Emergency Medicine

## 2017-05-09 DIAGNOSIS — F17210 Nicotine dependence, cigarettes, uncomplicated: Secondary | ICD-10-CM | POA: Insufficient documentation

## 2017-05-09 DIAGNOSIS — R51 Headache: Secondary | ICD-10-CM | POA: Insufficient documentation

## 2017-05-09 DIAGNOSIS — Z79899 Other long term (current) drug therapy: Secondary | ICD-10-CM | POA: Insufficient documentation

## 2017-05-09 DIAGNOSIS — R519 Headache, unspecified: Secondary | ICD-10-CM

## 2017-05-09 DIAGNOSIS — F419 Anxiety disorder, unspecified: Secondary | ICD-10-CM | POA: Insufficient documentation

## 2017-05-09 LAB — CBC WITH DIFFERENTIAL/PLATELET
BASOS ABS: 0 10*3/uL (ref 0.0–0.1)
Basophils Relative: 0 %
EOS PCT: 0 %
Eosinophils Absolute: 0 10*3/uL (ref 0.0–0.7)
HCT: 44.6 % (ref 39.0–52.0)
Hemoglobin: 15.2 g/dL (ref 13.0–17.0)
LYMPHS ABS: 2.7 10*3/uL (ref 0.7–4.0)
LYMPHS PCT: 25 %
MCH: 30.9 pg (ref 26.0–34.0)
MCHC: 34.1 g/dL (ref 30.0–36.0)
MCV: 90.7 fL (ref 78.0–100.0)
MONO ABS: 0.4 10*3/uL (ref 0.1–1.0)
Monocytes Relative: 4 %
Neutro Abs: 7.7 10*3/uL (ref 1.7–7.7)
Neutrophils Relative %: 71 %
PLATELETS: 165 10*3/uL (ref 150–400)
RBC: 4.92 MIL/uL (ref 4.22–5.81)
RDW: 12.8 % (ref 11.5–15.5)
WBC: 10.9 10*3/uL — ABNORMAL HIGH (ref 4.0–10.5)

## 2017-05-09 LAB — BASIC METABOLIC PANEL
Anion gap: 9 (ref 5–15)
BUN: 13 mg/dL (ref 6–20)
CALCIUM: 8.9 mg/dL (ref 8.9–10.3)
CO2: 24 mmol/L (ref 22–32)
CREATININE: 1.04 mg/dL (ref 0.61–1.24)
Chloride: 103 mmol/L (ref 101–111)
GFR calc Af Amer: >60 mL/min (ref >60)
GLUCOSE: 126 mg/dL — AB (ref 65–99)
Potassium: 3.6 mmol/L (ref 3.5–5.1)
Sodium: 136 mmol/L (ref 135–145)

## 2017-05-09 MED ORDER — KETOROLAC TROMETHAMINE 30 MG/ML IJ SOLN
30.0000 mg | Freq: Once | INTRAMUSCULAR | Status: AC
  Administered 2017-05-09: 30 mg via INTRAVENOUS
  Filled 2017-05-09: qty 1

## 2017-05-09 MED ORDER — SODIUM CHLORIDE 0.9 % IV BOLUS (SEPSIS)
1000.0000 mL | Freq: Once | INTRAVENOUS | Status: AC
Start: 2017-05-09 — End: 2017-05-09
  Administered 2017-05-09: 1000 mL via INTRAVENOUS

## 2017-05-09 MED ORDER — METOCLOPRAMIDE HCL 5 MG/ML IJ SOLN
10.0000 mg | Freq: Once | INTRAMUSCULAR | Status: AC
  Administered 2017-05-09: 10 mg via INTRAVENOUS
  Filled 2017-05-09: qty 2

## 2017-05-09 MED ORDER — DIPHENHYDRAMINE HCL 50 MG/ML IJ SOLN
25.0000 mg | Freq: Once | INTRAMUSCULAR | Status: AC
Start: 2017-05-09 — End: 2017-05-09
  Administered 2017-05-09: 25 mg via INTRAVENOUS
  Filled 2017-05-09: qty 1

## 2017-05-09 MED ORDER — SUMATRIPTAN SUCCINATE 100 MG PO TABS: 100.0000 mg | ORAL_TABLET | ORAL | 0 refills | Status: DC | PRN

## 2017-05-09 NOTE — ED Notes (Signed)
assesment completed with EDP with the use of stratus interpreter

## 2017-05-09 NOTE — Discharge Instructions (Signed)
See your Physician for recheck.  Return if any problems.  °

## 2017-05-09 NOTE — ED Triage Notes (Addendum)
Patient reports intermittent occipital/frontal headache onset last week unrelieved by OTC pain medication , denies head injury , headache is  associated with blurred vision and mild nausea . Arabic interpreter service utilized at triage .

## 2017-05-09 NOTE — ED Provider Notes (Signed)
[facility] EMERGENCY DEPARTMENT Provider Note   CSN: [id number] Arrival date & time: 05/09/17  1902     History   Chief Complaint Chief Complaint  Patient presents with  . Headache    HPI Daniel Nelson is a 31 y.o. male.  The history is provided by the patient. A language interpreter was used.  Headache   This is a new problem. The current episode started more than 1 week ago. The problem occurs constantly. The problem has not changed since onset.The headache is associated with nothing. The pain is located in the occipital region. The pain is moderate. The pain does not radiate. Associated symptoms include vomiting. He has tried nothing for the symptoms. The treatment provided no relief.    Past Medical History:  Diagnosis Date  . Headache   . Paresthesia     Patient Active Problem List   Diagnosis Date Noted  . Chronic pain syndrome 10/28/2016  . Tension-type headache, not intractable 07/09/2016  . Anxiety state 06/10/2016  . Deviated septum 03/27/2016  . Paresthesias 02/28/2016  . Chronic headaches 01/24/2016  . Epigastric pain 01/24/2016  . Myalgia 11/22/2015  . Seasonal allergies 11/11/2015  . H. pylori infection 08/17/2015  . Otalgia 08/01/2015  . H/O eye surgery 08/01/2015    Past Surgical History:  Procedure Laterality Date  . APPENDECTOMY    . EYE SURGERY Right "as a young child"       Home Medications    Prior to Admission medications   Medication Sig Start Date End Date Taking? Authorizing Provider  amitriptyline (ELAVIL) 25 MG tablet Take 1 tablet (25 mg total) by mouth at bedtime. 04/26/17   Tobey Grim, MD  cetirizine (ZYRTEC) 10 MG tablet Take 1 tablet (10 mg total) by mouth daily. 05/05/17   Linus Mako B, NP  ipratropium (ATROVENT) 0.06 % nasal spray Place 2 sprays into both nostrils 4 (four) times daily. 01/13/17   Deatra Canter, FNP  LORazepam (ATIVAN) 0.5 MG tablet Take 1 tablet (0.5 mg total) by  mouth daily as needed for anxiety. 04/09/17   Tobey Grim, MD  methocarbamol (ROBAXIN) 500 MG tablet Take 1 tablet (500 mg total) by mouth 2 (two) times daily. 05/05/17   Georgetta Haber, NP  naproxen (NAPROSYN) 500 MG tablet Take 1 tablet (500 mg total) by mouth 2 (two) times daily. Prn neck and head pain 04/30/17   Hayden Rasmussen, NP  neomycin-polymyxin-hydrocortisone (CORTISPORIN) 3.5-10000-1 OTIC suspension Place 4 drops into both ears 3 (three) times daily. 01/13/17   Deatra Canter, FNP  ondansetron (ZOFRAN) 4 MG tablet Take 1 tablet (4 mg total) by mouth every 6 (six) hours. Prn nausea 04/30/17   Hayden Rasmussen, NP  predniSONE (DELTASONE) 20 MG tablet 3 tabs po day one, then 2 po daily x 4 days 02/05/17   Lurene Shadow, PA-C  venlafaxine XR (EFFEXOR-XR) 37.5 MG 24 hr capsule TAKE ONE CAPSULE BY MOUTH DAILY WITH BREAKFAST. 01/05/17   Tobey Grim, MD    Family History Family History  Problem Relation Age of Onset  . Rheum arthritis Mother   . Healthy Father   . Colon cancer Neg Hx     Social History Social History  Substance Use Topics  . Smoking status: Current Some Day Smoker    Types: Cigarettes  . Smokeless tobacco: Never Used     Comment: tobacco info given 01/07/16  . Alcohol use No     Allergies  Duloxetine; Tramadol; and Dicyclomine   Review of Systems Review of Systems  Gastrointestinal: Positive for vomiting.  Neurological: Positive for headaches.  All other systems reviewed and are negative.    Physical Exam Updated Vital Signs BP 108/78 (BP Location: Left Arm)   Pulse 70   Temp 98.1 F (36.7 C) (Oral)   Resp 15   Ht 5\' 10"  (1.778 m)   Wt 83.9 kg (185 lb)   SpO2 100%   BMI 26.54 kg/m   Physical Exam  Constitutional: He appears well-developed and well-nourished.  HENT:  Head: Normocephalic and atraumatic.  Right Ear: External ear normal.  Left Ear: External ear normal.  Nose: Nose normal.  Mouth/Throat: Oropharynx is clear and moist.    Eyes: Conjunctivae are normal.  Neck: Neck supple.  Cardiovascular: Normal rate and regular rhythm.   No murmur heard. Pulmonary/Chest: Effort normal and breath sounds normal. No respiratory distress.  Abdominal: Soft. There is no tenderness.  Musculoskeletal: He exhibits no edema.  Neurological: He is alert.  Skin: Skin is warm and dry.  Psychiatric: He has a normal mood and affect.  Nursing note and vitals reviewed.    ED Treatments / Results  Labs (all labs ordered are listed, but only abnormal results are displayed) Labs Reviewed  CBC WITH DIFFERENTIAL/PLATELET - Abnormal; Notable for the following:       Result Value   WBC 10.9 (*)    All other components within normal limits  BASIC METABOLIC PANEL - Abnormal; Notable for the following:    Glucose, Bld 126 (*)    All other components within normal limits    EKG  EKG Interpretation None       Radiology No results found.  Procedures Procedures (including critical care time)  Medications Ordered in ED Medications  sodium chloride 0.9 % bolus 1,000 mL (1,000 mLs Intravenous New Bag/Given 05/09/17 2019)  ketorolac (TORADOL) 30 MG/ML injection 30 mg (30 mg Intravenous Given 05/09/17 2022)  metoCLOPramide (REGLAN) injection 10 mg (10 mg Intravenous Given 05/09/17 2020)  diphenhydrAMINE (BENADRYL) injection 25 mg (25 mg Intravenous Given 05/09/17 2021)     Initial Impression / Assessment and Plan / ED Course  I have reviewed the triage vital signs and the nursing notes.  Pertinent labs & imaging results that were available during my care of the patient were reviewed by me and considered in my medical decision making (see chart for details).   Pt has had ct scan in May of this year and an mri of his brain in 2017.  I do not feel pt needs any further imagining.  Pt reports he feels better after Iv fluids, reglan, benadryl and torodol.    Final Clinical Impressions(s) / ED Diagnoses   Final diagnoses:  Bad  headache    New Prescriptions New Prescriptions   SUMATRIPTAN (IMITREX) 100 MG TABLET    Take 1 tablet (100 mg total) by mouth every 2 (two) hours as needed for migraine. May repeat in 2 hours if headache persists or recurs.    An After Visit Summary was printed and given to the patient.    Elson AreasSofia, Fedrick Cefalu K, Cordelia Poche-C 05/09/17 2152    Tegeler, Canary Brimhristopher J, MD 05/10/17 [phone]

## 2017-05-21 ENCOUNTER — Ambulatory Visit (INDEPENDENT_AMBULATORY_CARE_PROVIDER_SITE_OTHER): Payer: Medicaid Other | Admitting: Family Medicine

## 2017-05-21 ENCOUNTER — Encounter: Payer: Self-pay | Admitting: Psychology

## 2017-05-21 VITALS — BP 98/60 | HR 67 | Temp 98.1°F | Wt 162.0 lb

## 2017-05-21 DIAGNOSIS — F411 Generalized anxiety disorder: Secondary | ICD-10-CM | POA: Diagnosis not present

## 2017-05-21 DIAGNOSIS — G8929 Other chronic pain: Secondary | ICD-10-CM

## 2017-05-21 DIAGNOSIS — R51 Headache: Secondary | ICD-10-CM

## 2017-05-21 NOTE — Assessment & Plan Note (Addendum)
Increased anxiety. Denies SI/HI. Patient not willing on my exam to take medications, as he is afraid of medication side effects. Patient requesting referral to psychiatry. Have discussed with Behavior Health who have agreed to see patient and stated they could help with referral to psychiatry.  -Behavioral health consult, appreciate recommendations -encouraged patient to make a follow up appointment with PCP as soon as possible, and he verbalized understanding and agreement

## 2017-05-21 NOTE — Patient Instructions (Signed)
Migraine Headache A migraine headache is a very strong throbbing pain on one side or both sides of your head. Migraines can also cause other symptoms. Talk with your doctor about what things may bring on (trigger) your migraine headaches. Follow these instructions at home: Medicines  Take over-the-counter and prescription medicines only as told by your doctor.  Do not drive or use heavy machinery while taking prescription pain medicine.  To prevent or treat constipation while you are taking prescription pain medicine, your doctor may recommend that you: ? Drink enough fluid to keep your pee (urine) clear or pale yellow. ? Take over-the-counter or prescription medicines. ? Eat foods that are high in fiber. These include fresh fruits and vegetables, whole grains, and beans. ? Limit foods that are high in fat and processed sugars. These include fried and sweet foods. Lifestyle  Avoid alcohol.  Do not use any products that contain nicotine or tobacco, such as cigarettes and e-cigarettes. If you need help quitting, ask your doctor.  Get at least 8 hours of sleep every night.  Limit your stress. General instructions   Keep a journal to find out what may bring on your migraines. For example, write down: ? What you eat and drink. ? How much sleep you get. ? Any change in what you eat or drink. ? Any change in your medicines.  If you have a migraine: ? Avoid things that make your symptoms worse, such as bright lights. ? It may help to lie down in a dark, quiet room. ? Do not drive or use heavy machinery. ? Ask your doctor what activities are safe for you.  Keep all follow-up visits as told by your doctor. This is important. Contact a doctor if:  You get a migraine that is different or worse than your usual migraines. Get help right away if:  Your migraine gets very bad.  You have a fever.  You have a stiff neck.  You have trouble seeing.  Your muscles feel weak or like you  cannot control them.  You start to lose your balance a lot.  You start to have trouble walking.  You pass out (faint). This information is not intended to replace advice given to you by your health care provider. Make sure you discuss any questions you have with your health care provider. Document Released: 04/14/2008 Document Revised: 01/24/2016 Document Reviewed: 12/23/2015 Elsevier Interactive Patient Education  2017 [employer]   It was a pleasure meeting you today.   Today we discussed migraine headaches. I have referred you to neurology. I will also have behavioral health talk to you today about anxiety.   Please follow up with your PCP as soon as you can for anxiety management. Please follow up as needed for headaches or sooner if symptoms persist or worsen. Please call the clinic immediately if you have concerns   Our clinic's number is [phone]. Please call with questions or concerns.   Thank you,  Oralia ManisSherin Jalah Warmuth, DO

## 2017-05-21 NOTE — Progress Notes (Signed)
   Subjective:    Patient ID: Daniel Nelson, male    DOB: 11/23/1985, 31 y.o.   MRN: [id number]   CC: headache and dizziness   HPI: Headache  Patient states headache is all over his head. Pain begins in back of his head and moves all the way to his forehead and his eyes. Patient states that when pain occurs he cannot see well and cannot focus his vision. States it hurts to move his head side to side. Endorses nausea/vomiting associated with headache. Denies photo or phonophobia. States pain is not sudden in onset, but rather gradually worsens. Denies hearing changes. States pain has occurred x2 weeks but on further questions states it has been ongoing and related to anxiety. Patient states he does not know if anxiety is causing headache or if increased headache is causing anxiety. Patient was seen in ED on 10/21 and given imitrex. Patient stated he only took it once and it did not help. Stated it made him feel a heat inside head and that radiated to stomach. Per chart review patient has had further work up for headaches including CT scanning in May 2018 which was negative.   Anxiety States increased anxiety. States he decided to stop all his medications due to anxiety over side effects. States increased anxiety is causing depression and decreased motivation. Denies SI or HI. States he has not been able to work due to increased anxiety but is too nervous to try medications. Stated he wants to see a psychiatrist but has been having difficulty with referral. Stated he has waited 3 months with no psychiatry follow up made.   Objective:  BP 98/60   Pulse 67   Temp 98.1 F (36.7 C) (Oral)   Wt 162 lb (73.5 kg)   SpO2 96%   BMI 23.24 kg/m  Vitals and nursing note reviewed  General: well nourished  HEENT: normocephalic, PERRL, EOMI, no AV nicking  Neck: supple, non-tender, without lymphadenopathy Cardiac: RRR, clear S1 and S2, no murmurs, rubs, or gallops Respiratory: clear to auscultation  bilaterally, no increased work of breathing Extremities: 5/5 muscle strength, normal grip strength  Skin: warm and dry, no rashes noted Neuro: awake and alert, CN2-12 intact, sensation intact bilaterally, no focal deficits   Assessment & Plan:    Chronic headaches Likely 2/2 anxiety. Patient not willing to take medications. Offered to call previous neurologist for outpatient referral, but patient refusing. Stated he did not like that neurologist.  -outpatient referral to neurology  -advised to use Imitrex before headache is severe for most benefit -tylenol and advil prn  -follow up with PCP as needed   Anxiety state Increased anxiety. Denies SI/HI. Patient not willing on my exam to take medications, as he is afraid of medication side effects. Patient requesting referral to psychiatry. Have discussed with Behavior Health who have agreed to see patient and stated they could help with referral to psychiatry.  -Behavioral health consult, appreciate recommendations -encouraged patient to make a follow up appointment with PCP as soon as possible, and he verbalized understanding and agreement   Return if symptoms worsen or fail to improve, for Please make appointment with Dr. Gwendolyn GrantWalden (PCP) for anxiety follow up.   Oralia ManisSherin Elfida Shimada, DO, PGY-1

## 2017-05-21 NOTE — Assessment & Plan Note (Addendum)
Likely 2/2 anxiety. Patient not willing to take medications. Offered to call previous neurologist for outpatient referral, but patient refusing. Stated he did not like that neurologist.  -outpatient referral to neurology  -advised to use Imitrex before headache is severe for most benefit -tylenol and advil prn  -follow up with PCP as needed

## 2017-05-21 NOTE — Progress Notes (Signed)
Dr. Darin EngelsAbraham requested a Behavioral Health Consult.   Presenting Issue:  Patient is presenting with anxiety symptoms.   Report of symptoms:  Patient reports multiple aches, including headaches, body aches, neck and arm pain as well as feeling anxious and tense all the time. These symptoms began roughly a year and a half ago. He reported around that time he experienced his body feeling fatigued and heavy and going to the emergency room. He is unsure what triggered the anxiety but thinks it may be a false STD diagnosis, which turned out to be regular infection but this caused him to be very worried and scared.   Daniel Nelson expressed some concern that all of his body aches are due to organ problems, however, he has gotten labs and tests to examine medical underpinnings and all were negative according to him.   Impact on function:  Daniel Nelson anxiety and headaches interfere with his ability to work and engage in pleasant activities. He is unable to relax even when he tries to stay busy.  Psychiatric History - Diagnoses: Anxiety - Pharmacotherapy: Elavil, Ativan, effexor. Patient was unsure of which medication exactly he was taking but reported that he started on 5 mg of a medication which was prescribed for 15 days. He took it for 4 days but discontinued after feeling nauseous and having abdominal pain. PCP prescribed him 10 mg and he said he did better on that and took it for 14 days. PCP put him on 25 mg and he said he stopped taking the medication b/c he was scared. He does not currently take anything.  - [facility]  Current and history of substance use:  Denied smoking, drinking and drug use   Per PCP he denied SI and HI  Assessment / Plan / Recommendations:  Daniel Nelson is experiencing severe anxiety and tension, which are impacting his work and social life and are very distressing to him. His affect was appropriate during the visit. Per PCP he denied SI and HI.  [facility] anxiety and discussed the benefit of intervening both behaviorally and pharmacologically to treat severe anxiety. [facility] also explained that some medications take longer than a few days/weeks to work and that staying on the medication for the prescribed time is important to determine whether they reduce symptoms. Daniel Nelson had discontinued anxiety medication previously due to unplesant side effects (nausea, stomach pain) but is willing to try medication again and will follow up with PCP and discuss starting medication again. [facility]. In the meantime he will practice deep breathing 2x daily to help with reducing some of the tension.   Warmhandoff:  Complete

## 2017-05-21 NOTE — Assessment & Plan Note (Signed)
Assessment / Plan / Recommendations:  Daniel Nelson is experiencing severe anxiety and tension, which are impacting his work and social life and are very distressing to him. His affect was appropriate during the visit. Per PCP he denied SI and HI.  [facility] about anxiety and discussed the benefit of intervening both behaviorally and pharmacologically to treat severe anxiety. [facility] also explained that some medications take longer than a few days/weeks to work and that staying on the medication for the prescribed time is important to determine whether they reduce symptoms. Mohamd had discontinued anxiety medication previously due to unplesant side effects (nausea, stomach pain) but is willing to try medication again and will follow up with PCP and discuss starting medication again. [facility]. In the meantime he will practice deep breathing 2x daily to help with reducing some of the tension.  Patient reported he has trouble making appointments due to language barrier so [facility] went to the front desk to help him make the appointments but they had left for the day. [facility] to call him with an interpreter to schedule the 2 appointments.

## 2017-05-24 ENCOUNTER — Encounter: Payer: Self-pay | Admitting: Psychology

## 2017-05-24 NOTE — Progress Notes (Signed)
Daniel Nelson asked me to call this patient to set up an appointment with his PCP and with [facility]. Through a translater, we set up his appointment with Byrd HesselbachMaria for Friday, November 9 at 10:30 a.m.. He will see Dr. Darin EngelsAbraham on Tuesday, November 13 at 1:45.

## 2017-05-25 ENCOUNTER — Encounter (HOSPITAL_COMMUNITY): Payer: Self-pay

## 2017-05-25 ENCOUNTER — Emergency Department (HOSPITAL_COMMUNITY)
Admission: EM | Admit: 2017-05-25 | Discharge: 2017-05-25 | Disposition: A | Discharge status: Home / Self Care | Payer: Medicaid Other | Attending: Emergency Medicine | Admitting: Emergency Medicine

## 2017-05-25 DIAGNOSIS — Z5321 Procedure and treatment not carried out due to patient leaving prior to being seen by health care provider: Secondary | ICD-10-CM | POA: Insufficient documentation

## 2017-05-25 DIAGNOSIS — R110 Nausea: Secondary | ICD-10-CM | POA: Diagnosis present

## 2017-05-25 LAB — COMPREHENSIVE METABOLIC PANEL
ALBUMIN: 4.3 g/dL (ref 3.5–5.0)
ALK PHOS: 43 U/L (ref 38–126)
ALT: 10 U/L — ABNORMAL LOW (ref 17–63)
AST: 14 U/L — AB (ref 15–41)
Anion gap: 8 (ref 5–15)
BILIRUBIN TOTAL: 1 mg/dL (ref 0.3–1.2)
BUN: 9 mg/dL (ref 6–20)
CALCIUM: 9.2 mg/dL (ref 8.9–10.3)
CO2: 24 mmol/L (ref 22–32)
Chloride: 104 mmol/L (ref 101–111)
Creatinine, Ser: 0.96 mg/dL (ref 0.61–1.24)
GFR calc Af Amer: >60 mL/min (ref >60)
GFR calc non Af Amer: >60 mL/min (ref >60)
GLUCOSE: 104 mg/dL — AB (ref 65–99)
Potassium: 3.7 mmol/L (ref 3.5–5.1)
Sodium: 136 mmol/L (ref 135–145)
TOTAL PROTEIN: 7.2 g/dL (ref 6.5–8.1)

## 2017-05-25 LAB — CBC
HCT: 43.9 % (ref 39.0–52.0)
Hemoglobin: 15.7 g/dL (ref 13.0–17.0)
MCH: 32.2 pg (ref 26.0–34.0)
MCHC: 35.8 g/dL (ref 30.0–36.0)
MCV: 90.1 fL (ref 78.0–100.0)
PLATELETS: 149 10*3/uL — AB (ref 150–400)
RBC: 4.87 MIL/uL (ref 4.22–5.81)
RDW: 12.9 % (ref 11.5–15.5)
WBC: 7.3 10*3/uL (ref 4.0–10.5)

## 2017-05-25 NOTE — ED Triage Notes (Signed)
Upon talking to patient he reports he has some headache. Pt alert and oriented X4, denies dizziness. Vitals stable.

## 2017-05-25 NOTE — ED Triage Notes (Addendum)
[facility]- pt coming in after he had an episode of dizziness while driving. Pt begin hyperventilating and became nauseous. Pt alert and oriented and now only complains of nausea. 128/80, HR 100, rr18, CBG 124.

## 2017-05-27 ENCOUNTER — Encounter: Payer: Self-pay | Admitting: Student

## 2017-05-27 ENCOUNTER — Ambulatory Visit: Payer: Medicaid Other | Admitting: Student

## 2017-05-27 ENCOUNTER — Other Ambulatory Visit: Payer: Self-pay

## 2017-05-27 VITALS — BP 100/60 | HR 63 | Temp 98.2°F | Ht 73.0 in | Wt 161.4 lb

## 2017-05-27 DIAGNOSIS — F411 Generalized anxiety disorder: Secondary | ICD-10-CM

## 2017-05-27 DIAGNOSIS — F0630 Mood disorder due to known physiological condition, unspecified: Secondary | ICD-10-CM | POA: Diagnosis not present

## 2017-05-27 DIAGNOSIS — G44209 Tension-type headache, unspecified, not intractable: Secondary | ICD-10-CM | POA: Diagnosis not present

## 2017-05-27 MED ORDER — AMITRIPTYLINE HCL 25 MG PO TABS: 25.0000 mg | ORAL_TABLET | Freq: Every day | ORAL | 1 refills | Status: DC

## 2017-05-27 NOTE — Patient Instructions (Addendum)
It was great seeing you today! We have addressed the following issues today  Headache: this is likely a tension headache.  We have also sent in a prescription for amitriptyline to pharmacy.  Please pick up this prescription and start taking today.  You already have a referral to psychiatrist.  I suggest waiting on this patiently.    If we did any lab work today, and the results require attention, either me or my nurse will get in touch with you. If everything is normal, you will get a letter in mail and a message via . If you don't hear from us in two weeks, please give us a call. Otherwise, we look forward to seeing you again at your next visit. If you have any questions or concerns before then, please call the clinic at [phone].  Please bring all your medications to every doctors visit  Sign up for My Chart to have easy access to your labs results, and communication with your Primary care physician.    Please check-out at the front desk before leaving the clinic.    Take Care,   Dr. Alanda SlimGonfa   Tension Headache A tension headache is pain, pressure, or aching that is felt over the front and sides of your head. These headaches can last from 30 minutes to several days. Follow these instructions at home: Managing pain  Take over-the-counter and prescription medicines only as told by your doctor.  Lie down in a dark, quiet room when you have a headache.  If directed, apply ice to your head and neck area: ? Put ice in a plastic bag. ? Place a towel between your skin and the bag. ? Leave the ice on for 20 minutes, 2-3 times per day.  Use a heating pad or a hot shower to apply heat to your head and neck area as told by your doctor. Eating and drinking  Eat meals on a regular schedule.  Do not drink a lot of alcohol.  Do not use a lot of caffeine, or stop using caffeine. General instructions  Keep all follow-up visits as told by your doctor. This is important.  Keep a  journal to find out if certain things bring on headaches. For example, write down: ? What you eat and drink. ? How much sleep you get. ? Any change to your diet or medicines.  Try getting a massage, or doing other things that help you to relax.  Lessen stress.  Sit up straight. Do not tighten (tense) your muscles.  Do not use tobacco products. This includes cigarettes, chewing tobacco, or e-cigarettes. If you need help quitting, ask your doctor.  Exercise regularly as told by your doctor.  Get enough sleep. This may mean 7-9 hours of sleep. Contact a doctor if:  Your symptoms are not helped by medicine.  You have a headache that feels different from your usual headache.  You feel sick to your stomach (nauseous) or you throw up (vomit).  You have a fever. Get help right away if:  Your headache becomes very bad.  You keep throwing up.  You have a stiff neck.  You have trouble seeing.  You have trouble speaking.  You have pain in your eye or ear.  Your muscles are weak or you lose muscle control.  You lose your balance or you have trouble walking.  You feel like you will pass out (faint) or you pass out.  You have confusion. This information is not intended to replace  advice given to you by your health care provider. Make sure you discuss any questions you have with your health care provider. Document Released: 09/30/2009 Document Revised: 03/05/2016 Document Reviewed: 10/29/2014 Elsevier Interactive Patient Education  [employer]2018 [employer] Inc.

## 2017-05-27 NOTE — Progress Notes (Signed)
Subjective:    Daniel BreedingMohamad Nelson is a 31 y.o. old male here for headache.  Patient is here with his mother.  Video interpreter with ID [phone] was used for this encounter.  Patient is a very poor historian.  HPI Headache: feels heavy all the way from head to chest and back. He also feels dry mouth and tongue.  Pain is mainly over the back of his neck into his head. This has been going on for one year and half. It is getting worse for the last three weeks. He says he is fatigued recently.  He has history of anxiety and depression.  He was prescribed different kinds of antidepressants in the past.  He is also followed in mood clinic.  He does not remember what medications he takes.  He also states that he is anxious about taking his medications.  He could not be specific where he feels anxious and he needs medications. He also reports issues with his insurance and that he has not been able to get his medications.  On the other side, he stated that he has about 25 bottles of medications at home and does not know which is which.  He also reports blurry vision for the last 3 years.  He reports history of laser surgery in his right eye about [place]  He endorses nausea for 3 weeks but denies emesis.  He denies numbness or tingling in his arms or legs.   PMH/Problem List: has Otalgia; H/O eye surgery; H. pylori infection; Seasonal allergies; Myalgia; Chronic headaches; Epigastric pain; Paresthesias; Deviated septum; Anxiety state; Tension-type headache, not intractable; Chronic pain syndrome; and Mood disorder in conditions classified elsewhere on their problem list.   has a past medical history of Headache and Paresthesia.  FH:  Family History  Problem Relation Age of Onset  . Rheum arthritis Mother   . Healthy Father   . Colon cancer Neg Hx     SH Social History   Tobacco Use  . Smoking status: Current Some Day Smoker    Types: Cigarettes  . Smokeless tobacco: Never Used  .  Tobacco comment: tobacco info given 01/07/16  Substance Use Topics  . Alcohol use: No    Alcohol/week: 0.0 oz  . Drug use: No    Review of Systems Review of systems negative except for pertinent positives and negatives in history of present illness above.     Objective:     Vitals:   05/27/17 1446  BP: 100/60  Pulse: 63  Temp: 98.2 F (36.8 C)  TempSrc: Oral  SpO2: 97%  Weight: 161 lb 6.4 oz (73.2 kg)  Height: 6\' 1"  (1.854 m)   Body mass index is 21.29 kg/m.  Physical Exam GENERAL: appears well, no ditress HEENT: PERRLA, EOMI LUNGS:  No IWOB HEART:  RRR with no M/R/G EXT:  No C/E/E NEURO:   CN II-XII intact, motor 5/5 in all extremities, light sensation intact in all dermatomes, biceps and patellar reflex 2+ bilaterally PSYCH: appropriately dressed. Maintains good eye contact. Mood is depressed with a mildly restricted affect. No suicidal or homicidal ideation. Does not appear to be responding to any internal stimuli. Not able to maintain train of thought and concentrate on the questions. Cognitive ability may be below average.      Assessment and Plan:  1. Tension-type headache, not intractable, unspecified chronicity pattern: discussed about relaxation techniques and exercises.  I have low suspicion for migraine or intracranial processes.  His  underlying mood issue could have some roles.  2. Mood disorder in conditions classified elsewhere: suspect some element of depression, anxiety and ?PTSD.  Unfortunately, he does not seem to take his medications.  It is also unclear to me which medicines he is supposed to be taking.  He has amitriptyline and Effexor listed on his chart.  He does not recognize any of his medications by name. He says he has 25 bottles of medications at home but does not take any of them.  Called his pharmacy if there is an issue dispensing his medicines. They could not find amitriptyline on his chart.  He had Imitrex, Zofran and Robaxin ready for pickup.  I  refilled his amitriptyline 25 daily and discontinued his Imitrex and effexor. I emphasized the importance of taking this medication. I also explained to him that it may take 4-6 weeks before we make a conclusion that the medicine is working or not. I also advised him to bring all his medication bottles to his next office visits.   Return if symptoms worsen or fail to improve.  Almon Herculesaye T Addie Cederberg, MD 05/27/17 Pager: [phone]

## 2017-05-28 ENCOUNTER — Ambulatory Visit: Payer: Medicaid Other

## 2017-05-31 NOTE — Progress Notes (Signed)
   Subjective:    Patient ID: Daniel Nelson, male    DOB: 11/11/1985, 31 y.o.   MRN: [id number]   CC:  Anxiety & headaches   HPI: Headaches  Pain coming from back of neck radiating to head. States pain is all over his head. Feels like his "head is hard". Endorses dizziness, hearing changes (pressure, not balanced), vision changes, nausea and vomiting. Denies photo or phonophobia. Patient has been prescribed imitrex in the past but does not take medications for fear of side effects. Patient not willing to take medications today. States he wants a referral to psychiatry. Patient has previously been referred to neurology in the past. Patient frustrated that every doctor tells him a different diagnosis. Wants to have repeat imaging done.   Anxiety Seen on 11/2 and referred to behavioral health. At that time patient not taking any medications for anxiety over side effects. Seen again on 11/8. Re-started on amitriptyline 25mg  daily and discontinued imitrex and effexor. Patient has not started any medications and has not had further behavioral health follow up. States he would like referral to psychiatry for medication management and counseling. Denies SI/HI. States anxiety prevents him from leaving his house and performing job duties.   Objective:  BP (!) 82/48   Pulse 64   Temp 98.1 F (36.7 C) (Oral)   Wt 165 lb (74.8 kg)   SpO2 98%   BMI 21.77 kg/m  Vitals and nursing note reviewed  General: well nourished, in no acute distress Cardiac: RRR, clear S1 and S2, no murmurs, rubs, or gallops Respiratory: clear to auscultation bilaterally, no increased work of breathing Extremities: no edema or cyanosis. Warm, well perfused.  Skin: warm and dry, no rashes noted Neuro: alert and oriented, no focal deficits. CN 2-12 grossly intact. Sensation intact bilaterally.   Assessment & Plan:    Anxiety state Patient with GAD 7 score of 21 + extremely difficult and PHQ 9 of 18 + extremely  difficult. Patient refusing to use medications for fear of side effects. States that he would consider restarting amitriptyline 25mg . Patient to speak to behavioral health today. Agreeable for follow up until psychiatry appointment. No SI/HI.  -referral to psychiatry -follow up with behavioral health -patient verbalized understanding to call [name] for [facility] and set up appointment. Stated he has a family member who can help translate to make appointment    Chronic headaches Likely 2/2 anxiety. Patient not willing to take medications. Patient already with ambulatory referral to neurologist.  -advised to use advil/aleve prn  -follow up with PCP as needed -no imaging needed at this time as no neuro deficits and normal exam    Return if symptoms worsen or fail to improve.   Oralia ManisSherin Sehaj Mcenroe, DO, PGY-1

## 2017-06-01 ENCOUNTER — Ambulatory Visit (INDEPENDENT_AMBULATORY_CARE_PROVIDER_SITE_OTHER): Payer: Medicaid Other | Admitting: Family Medicine

## 2017-06-01 ENCOUNTER — Encounter: Payer: Self-pay | Admitting: Family Medicine

## 2017-06-01 ENCOUNTER — Encounter: Payer: Self-pay | Admitting: Licensed Clinical Social Worker

## 2017-06-01 ENCOUNTER — Other Ambulatory Visit: Payer: Self-pay

## 2017-06-01 VITALS — BP 82/48 | HR 64 | Temp 98.1°F | Wt 165.0 lb

## 2017-06-01 DIAGNOSIS — F411 Generalized anxiety disorder: Secondary | ICD-10-CM

## 2017-06-01 DIAGNOSIS — R51 Headache: Secondary | ICD-10-CM | POA: Diagnosis not present

## 2017-06-01 DIAGNOSIS — R519 Headache, unspecified: Secondary | ICD-10-CM

## 2017-06-01 NOTE — Patient Instructions (Signed)
Generalized Anxiety Disorder, Adult Generalized anxiety disorder (GAD) is a mental health disorder. People with this condition constantly worry about everyday events. Unlike normal anxiety, worry related to GAD is not triggered by a specific event. These worries also do not fade or get better with time. GAD interferes with life functions, including relationships, work, and school. GAD can vary from mild to severe. People with severe GAD can have intense waves of anxiety with physical symptoms (panic attacks). What are the causes? The exact cause of GAD is not known. What increases the risk? This condition is more likely to develop in:  Women.  People who have a family history of anxiety disorders.  People who are very shy.  People who experience very stressful life events, such as the death of a loved one.  People who have a very stressful family environment.  What are the signs or symptoms? People with GAD often worry excessively about many things in their lives, such as their health and family. They may also be overly concerned about:  Doing well at work.  Being on time.  Natural disasters.  Friendships.  Physical symptoms of GAD include:  Fatigue.  Muscle tension or having muscle twitches.  Trembling or feeling shaky.  Being easily startled.  Feeling like your heart is pounding or racing.  Feeling out of breath or like you cannot take a deep breath.  Having trouble falling asleep or staying asleep.  Sweating.  Nausea, diarrhea, or irritable bowel syndrome (IBS).  Headaches.  Trouble concentrating or remembering facts.  Restlessness.  Irritability.  How is this diagnosed? Your health care provider can diagnose GAD based on your symptoms and medical history. You will also have a physical exam. The health care provider will ask specific questions about your symptoms, including how severe they are, when they started, and if they come and go. Your health care  provider may ask you about your use of alcohol or drugs, including prescription medicines. Your health care provider may refer you to a mental health specialist for further evaluation. Your health care provider will do a thorough examination and may perform additional tests to rule out other possible causes of your symptoms. To be diagnosed with GAD, a person must have anxiety that:  Is out of his or her control.  Affects several different aspects of his or her life, such as work and relationships.  Causes distress that makes him or her unable to take part in normal activities.  Includes at least three physical symptoms of GAD, such as restlessness, fatigue, trouble concentrating, irritability, muscle tension, or sleep problems.  Before your health care provider can confirm a diagnosis of GAD, these symptoms must be present more days than they are not, and they must last for six months or longer. How is this treated? The following therapies are usually used to treat GAD:  Medicine. Antidepressant medicine is usually prescribed for long-term daily control. Antianxiety medicines may be added in severe cases, especially when panic attacks occur.  Talk therapy (psychotherapy). Certain types of talk therapy can be helpful in treating GAD by providing support, education, and guidance. Options include: ? Cognitive behavioral therapy (CBT). People learn coping skills and techniques to ease their anxiety. They learn to identify unrealistic or negative thoughts and behaviors and to replace them with positive ones. ? Acceptance and commitment therapy (ACT). This treatment teaches people how to be mindful as a way to cope with unwanted thoughts and feelings. ? Biofeedback. This process trains you to   manage your body's response (physiological response) through breathing techniques and relaxation methods. You will work with a therapist while machines are used to monitor your physical symptoms.  Stress  management techniques. These include yoga, meditation, and exercise.  A mental health specialist can help determine which treatment is best for you. Some people see improvement with one type of therapy. However, other people require a combination of therapies. Follow these instructions at home:  Take over-the-counter and prescription medicines only as told by your health care provider.  Try to maintain a normal routine.  Try to anticipate stressful situations and allow extra time to manage them.  Practice any stress management or self-calming techniques as taught by your health care provider.  Do not punish yourself for setbacks or for not making progress.  Try to recognize your accomplishments, even if they are small.  Keep all follow-up visits as told by your health care provider. This is important. Contact a health care provider if:  Your symptoms do not get better.  Your symptoms get worse.  You have signs of depression, such as: ? A persistently sad, cranky, or irritable mood. ? Loss of enjoyment in activities that used to bring you joy. ? Change in weight or eating. ? Changes in sleeping habits. ? Avoiding friends or family members. ? Loss of energy for normal tasks. ? Feelings of guilt or worthlessness. Get help right away if:  You have serious thoughts about hurting yourself or others. If you ever feel like you may hurt yourself or others, or have thoughts about taking your own life, get help right away. You can go to your nearest emergency department or call:  Your local emergency services (911 in the [place]  A suicide crisis helpline, such as the National Suicide Prevention Lifeline at 720-431-56091-601 414 4752. This is open 24 hours a day.  Summary  Generalized anxiety disorder (GAD) is a mental health disorder that involves worry that is not triggered by a specific event.  People with GAD often worry excessively about many things in their lives, such as their health and  family.  GAD may cause physical symptoms such as restlessness, trouble concentrating, sleep problems, frequent sweating, nausea, diarrhea, headaches, and trembling or muscle twitching.  A mental health specialist can help determine which treatment is best for you. Some people see improvement with one type of therapy. However, other people require a combination of therapies. This information is not intended to replace advice given to you by your health care provider. Make sure you discuss any questions you have with your health care provider. Document Released: 10/31/2012 Document Revised: 05/26/2016 Document Reviewed: 05/26/2016 Elsevier Interactive Patient Education  [employer]2018 Else[employer] Inc.   It was a pleasure meeting you today.   Today we discussed your headaches and anxiety.  For headaches you may take over the counter aleve as needed.   For your anxiety I have referred you to psychiatry. I think you would benefit most from continued follow up with psychiatry/behavioral health.   Please follow up as needed or sooner if symptoms persist or worsen. Please call the clinic immediately if you have concerns.   Our clinic's number is [phone]. Please call with questions or concerns.   Thank you,  Oralia ManisSherin Fumiko Cham, DO

## 2017-06-01 NOTE — Assessment & Plan Note (Signed)
Patient with GAD 7 score of 21 + extremely difficult and PHQ 9 of 18 + extremely difficult. Patient refusing to use medications for fear of side effects. States that he would consider restarting amitriptyline 25mg . Patient to speak to behavioral health today. Agreeable for follow up until psychiatry appointment. No SI/HI.  -referral to psychiatry -follow up with behavioral health -patient verbalized understanding to call [name] for [facility] and set up appointment. Stated he has a family member who can help translate to make appointment

## 2017-06-01 NOTE — Assessment & Plan Note (Signed)
Likely 2/2 anxiety. Patient not willing to take medications. Patient already with ambulatory referral to neurologist.  -advised to use advil/aleve prn  -follow up with PCP as needed -no imaging needed at this time as no neuro deficits and normal exam

## 2017-06-01 NOTE — Progress Notes (Signed)
ESTIMATE TIME:15 minutes Type of Service: [facility] warm handoff  Interpreter:Yes.   Daniel DavenportSandra #[id number] via [employer]   SUBJECTIVE: Daniel Nelson is a 31 y.o. male referred by Dr. Darin EngelsAbraham for: symptoms of  anxiety and depression. Patient was accompanied by his wife. Patient states he received a referral to psychiatry in the past and has been waiting a long time. Informed LCSW he only wants to see psychiatry for treatment.   GOALS: Patient will reduce symptoms of: anxiety, depression and mood instability, and increase  ability [name] and self-management skills, INTERVENTION: , Reflective listening, discussed Referral to Psychologist with [facility]   PHQ 9=18, indication of: severe depression. GAD-7=21,indication of : severe anxiety.  ISSUES DISCUSSED: Integrated care services, support system, previous and current coping skills, community resources , and options patient is willing to consider.    ASSESSMENT:Patient currently experiencing symptoms of depression and anxiety. Patient states he has tried relaxed breathing and it does not work for him. [facility]. Patient understands it maybe a long wait for psychiatry and willing to consider other options.  Patient may benefit from, and is in agreement to receive further assessment and therapeutic interventions from the mood clinic to assist with managing his symptoms.    PLAN:   1.Patient will F/U with Dr. Pascal Nelson for [facility]  2. Behavioral recommendations: none at this time  3. Referral:[facility] with [facility]. Pascal Nelson,   4. PCP made a referral to psychiatry    Warm Hand Off Completed.     Daniel Hineseborah Daniel Cueto, LCSW Licensed Clinical Social Worker Daniel Nelson   [phone] 11:32 AM

## 2017-06-30 ENCOUNTER — Other Ambulatory Visit: Payer: Self-pay

## 2017-06-30 ENCOUNTER — Ambulatory Visit (HOSPITAL_COMMUNITY)
Admission: EM | Admit: 2017-06-30 | Discharge: 2017-06-30 | Disposition: A | Discharge status: Home / Self Care | Payer: Medicaid Other | Attending: Family Medicine | Admitting: Family Medicine

## 2017-06-30 ENCOUNTER — Encounter (HOSPITAL_COMMUNITY): Payer: Self-pay | Admitting: *Deleted

## 2017-06-30 DIAGNOSIS — G8929 Other chronic pain: Secondary | ICD-10-CM

## 2017-06-30 DIAGNOSIS — R51 Headache: Secondary | ICD-10-CM | POA: Diagnosis not present

## 2017-06-30 MED ORDER — KETOROLAC TROMETHAMINE 60 MG/2ML IM SOLN: 60.0000 mg | Freq: Once | INTRAMUSCULAR | Status: DC

## 2017-06-30 MED ORDER — KETOROLAC TROMETHAMINE 60 MG/2ML IM SOLN
INTRAMUSCULAR | Status: AC
  Filled 2017-06-30: qty 2

## 2017-06-30 MED ORDER — ONDANSETRON 4 MG PO TBDP
ORAL_TABLET | ORAL | Status: AC
  Filled 2017-06-30: qty 1

## 2017-06-30 MED ORDER — ONDANSETRON HCL 4 MG PO TABS: 4.0000 mg | ORAL_TABLET | Freq: Three times a day (TID) | ORAL | 0 refills | Status: DC | PRN

## 2017-06-30 MED ORDER — ONDANSETRON 4 MG PO TBDP
4.0000 mg | ORAL_TABLET | Freq: Once | ORAL | Status: AC
  Administered 2017-06-30: 4 mg via ORAL

## 2017-06-30 NOTE — ED Provider Notes (Signed)
[facility]    CSN: [id number] Arrival date & time: 06/30/17  1337     History   Chief Complaint Chief Complaint  Patient presents with  . Headache    HPI Daniel Nelson is a 31 y.o. male.   Daniel presents with his family with complaints of headache, fatigue, poor sleep and depressed mood which have been ongoing for him for greater than a year. Arabic video interpreter used to collect history and physical. The pain is to the top of his head, neck and upper back, as well as his forehead. He has had both CT and MRI of his head in the past for this, he is following with a PCP. He was started on amitriptyline, and the dose was increased. He states he is unable to tolerate the increased dose therefore he stopped taking it. He has not taken any other medications for his symptoms. He states he has been unable to fill any other prescriptions due to cost. He has mild nausea. He has a depressed mood, denies suicidal or homicidal ideation. Pain is worse with movement of the neck and head.    ROS per HPI.       Past Medical History:  Diagnosis Date  . Headache   . Paresthesia     Patient Active Problem List   Diagnosis Date Noted  . Mood disorder in conditions classified elsewhere 05/27/2017  . Chronic pain syndrome 10/28/2016  . Tension-type headache, not intractable 07/09/2016  . Anxiety state 06/10/2016  . Deviated septum 03/27/2016  . Paresthesias 02/28/2016  . Chronic headaches 01/24/2016  . Epigastric pain 01/24/2016  . Myalgia 11/22/2015  . Seasonal allergies 11/11/2015  . H. pylori infection 08/17/2015  . Otalgia 08/01/2015  . H/O eye surgery 08/01/2015    Past Surgical History:  Procedure Laterality Date  . APPENDECTOMY    . EYE SURGERY Right "as a young child"       Home Medications    Prior to Admission medications   Medication Sig Start Date End Date Taking? Authorizing Provider  amitriptyline (ELAVIL) 25 MG tablet Take 1 tablet  (25 mg total) at bedtime by mouth. 05/27/17  Yes Almon HerculesGonfa, Taye T, MD  cetirizine (ZYRTEC) 10 MG tablet Take 1 tablet (10 mg total) by mouth daily. 05/05/17   Linus MakoBurky, Flemon Kelty B, NP  ipratropium (ATROVENT) 0.06 % nasal spray Place 2 sprays into both nostrils 4 (four) times daily. 01/13/17   Deatra Canterxford, William J, FNP  LORazepam (ATIVAN) 0.5 MG tablet Take 1 tablet (0.5 mg total) by mouth daily as needed for anxiety. 04/09/17   Tobey GrimWalden, Jeffrey H, MD  methocarbamol (ROBAXIN) 500 MG tablet Take 1 tablet (500 mg total) by mouth 2 (two) times daily. 05/05/17   Georgetta HaberBurky, Christoper Bushey B, NP  naproxen (NAPROSYN) 500 MG tablet Take 1 tablet (500 mg total) by mouth 2 (two) times daily. Prn neck and head pain 04/30/17   Hayden RasmussenMabe, David, NP  neomycin-polymyxin-hydrocortisone (CORTISPORIN) 3.5-10000-1 OTIC suspension Place 4 drops into both ears 3 (three) times daily. 01/13/17   Deatra Canterxford, William J, FNP  ondansetron (ZOFRAN) 4 MG tablet Take 1 tablet (4 mg total) by mouth every 8 (eight) hours as needed for nausea or vomiting. 06/30/17   Georgetta HaberBurky, Pantelis Elgersma B, NP  predniSONE (DELTASONE) 20 MG tablet 3 tabs po day one, then 2 po daily x 4 days 02/05/17   Lurene ShadowPhelps, Erin O, PA-C  SUMAtriptan (IMITREX) 100 MG tablet Take 1 tablet (100 mg total) by mouth every 2 (two) hours  as needed for migraine. May repeat in 2 hours if headache persists or recurs. 05/09/17   Elson AreasSofia, Leslie K, PA-C    Family History Family History  Problem Relation Age of Onset  . Rheum arthritis Mother   . Healthy Father   . Colon cancer Neg Hx     Social History Social History   Tobacco Use  . Smoking status: Current Some Day Smoker    Types: Cigarettes  . Smokeless tobacco: Never Used  . Tobacco comment: tobacco info given 01/07/16  Substance Use Topics  . Alcohol use: No    Alcohol/week: 0.0 oz  . Drug use: No     Allergies   Duloxetine; Tramadol; and Dicyclomine   Review of Systems Review of Systems   Physical Exam Triage Vital Signs ED Triage Vitals    Enc Vitals Group     BP 06/30/17 1429 107/64     Pulse Rate 06/30/17 1429 67     Resp --      Temp 06/30/17 1429 97.8 F (36.6 C)     Temp src --      SpO2 06/30/17 1429 100 %     Weight --      Height --      Head Circumference --      Peak Flow --      Pain Score 06/30/17 1428 6     Pain Loc --      Pain Edu? --      Excl. in GC? --    No data found.  Updated Vital Signs BP 107/64 (BP Location: Right Arm)   Pulse 67   Temp 97.8 F (36.6 C)   SpO2 100%   Visual Acuity Right Eye Distance:   Left Eye Distance:   Bilateral Distance:    Right Eye Near:   Left Eye Near:    Bilateral Near:     Physical Exam  Constitutional: He is oriented to person, place, and time. He appears well-developed and well-nourished.  HENT:  Right Ear: Tympanic membrane and ear canal normal.  Left Ear: Tympanic membrane and ear canal normal.  Eyes: Pupils are equal, round, and reactive to light.  Cardiovascular: Normal rate and regular rhythm.  Pulmonary/Chest: Effort normal and breath sounds normal.  Neurological: He is alert and oriented to person, place, and time. He has normal strength. He is not disoriented. No cranial nerve deficit or sensory deficit. Coordination normal. GCS eye subscore is 4. GCS verbal subscore is 5. GCS motor subscore is 6.  Skin: Skin is warm and dry.  Psychiatric: He has a normal mood and affect. His mood appears not anxious.     UC Treatments / Results  Labs (all labs ordered are listed, but only abnormal results are displayed) Labs Reviewed - No data to display  EKG  EKG Interpretation None       Radiology No results found.  Procedures Procedures (including critical care time)  Medications Ordered in UC Medications  ketorolac (TORADOL) injection 60 mg (60 mg Intramuscular Not Given 06/30/17 1535)  ondansetron (ZOFRAN-ODT) disintegrating tablet 4 mg (4 mg Oral Given 06/30/17 1535)     Initial Impression / Assessment and Plan / UC Course   I have reviewed the triage vital signs and the nursing notes.  Pertinent labs & imaging results that were available during my care of the patient were reviewed by me and considered in my medical decision making (see chart for details).     Without acute neurological  findings. Patient declines toradol at this time. zofran given and prescribed for nausea. To continue to follow with PCP for amitriptyline management and chronic pain. Patient non toxic non distressed in appearance at this time. Patient verbalized understanding and agreeable to plan.    Final Clinical Impressions(s) / UC Diagnoses   Final diagnoses:  Chronic nonintractable headache, unspecified headache type    ED Discharge Orders        Ordered    ondansetron (ZOFRAN) 4 MG tablet  Every 8 hours PRN     06/30/17 1535       Controlled Substance Prescriptions [place] Controlled Substance Registry consulted? Not Applicable   Georgetta Haber, NP 06/30/17 1550

## 2017-06-30 NOTE — Discharge Instructions (Signed)
May try advil or tylenol as needed for headache.  Zofran as needed for nausea.  Please follow up with your primary care doctor to further discuss your medications and treatment.

## 2017-06-30 NOTE — ED Triage Notes (Addendum)
Per pt he's been having really bad headaches for the past month now. Per pt his neck hurts and that radiates to his face, per pt he's having problems seeing out of both of his eyes due to the headaches

## 2017-07-05 ENCOUNTER — Ambulatory Visit: Payer: Medicaid Other | Admitting: Family Medicine

## 2017-07-10 IMAGING — CT CT HEAD W/O CM
2 of 5 series · 11 of 47 positions shown, 13 images · non-contrast
Comparison: None.

CLINICAL DATA: Initial evaluation for acute frontal headache
radiating into the left parietal and year. Dizziness.

EXAM:
CT HEAD WITHOUT CONTRAST
TECHNIQUE: Contiguous axial images were obtained from the base of the skull
through the vertex without intravenous contrast.

[Series 204: coronal · coronal · 0.43mm/px · 8 of 65 slices shown, 10 images]
[im 8/65  brain]
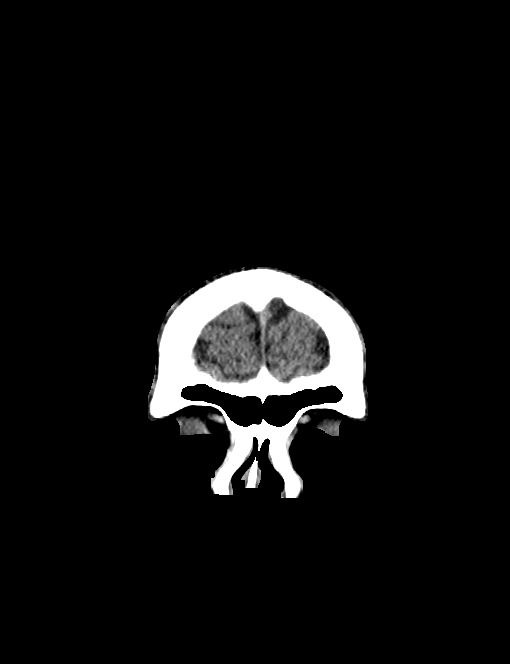
[im 8/65  bone]
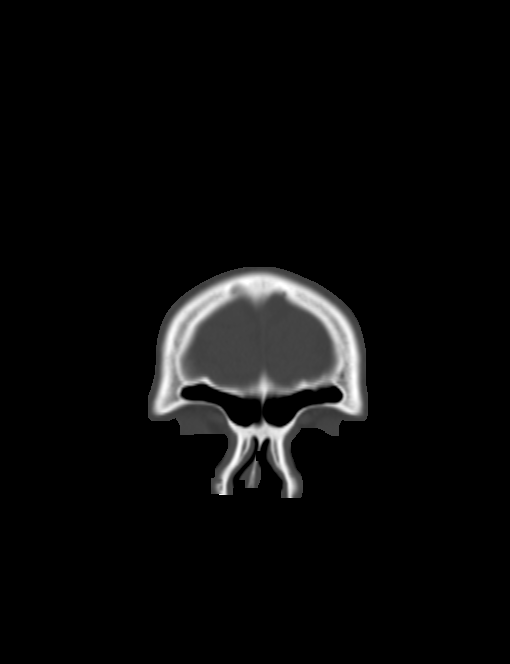
[im 15/65  brain]
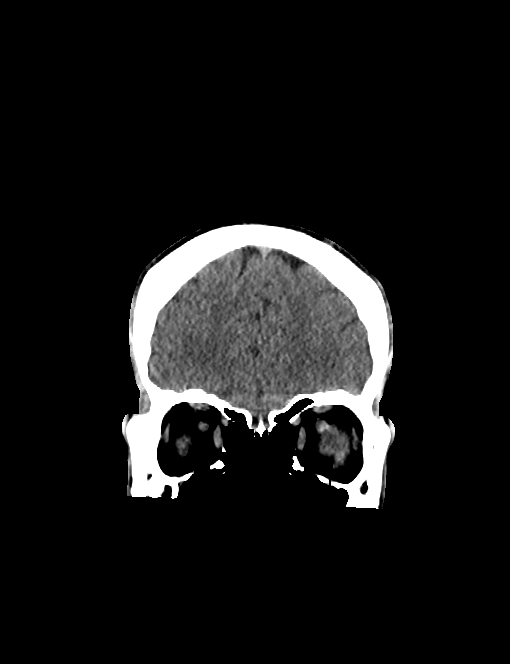
[im 22/65  brain]
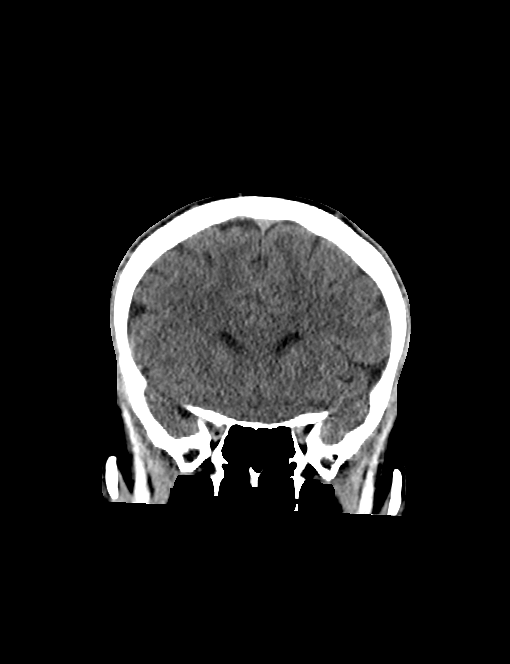
[im 29/65  brain]
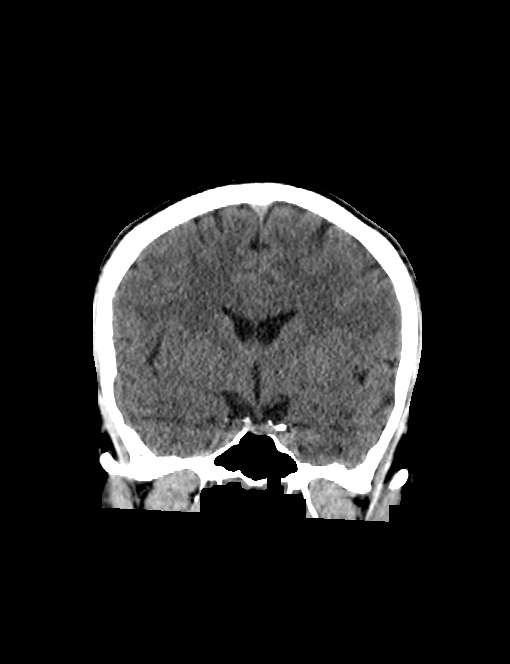
[im 36/65  brain]
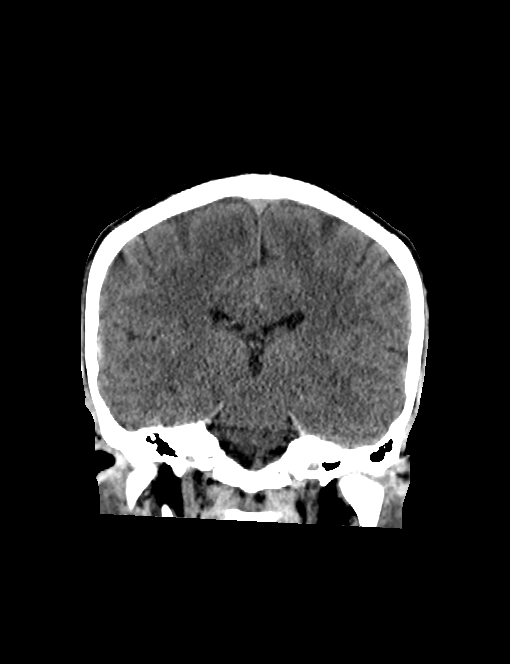
[im 36/65  bone]
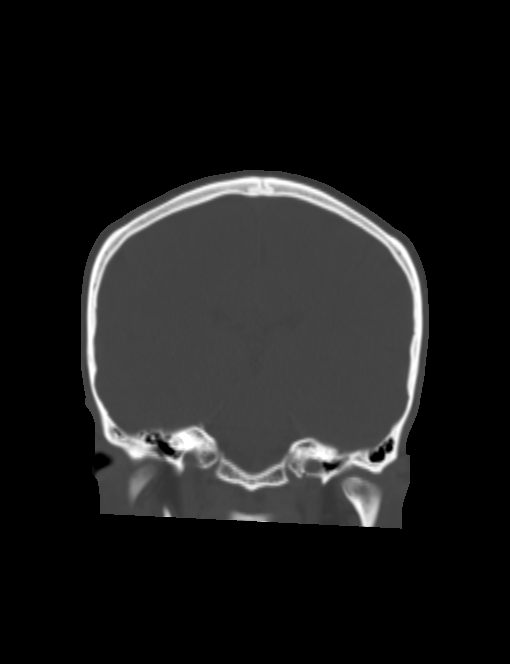
[im 43/65  brain]
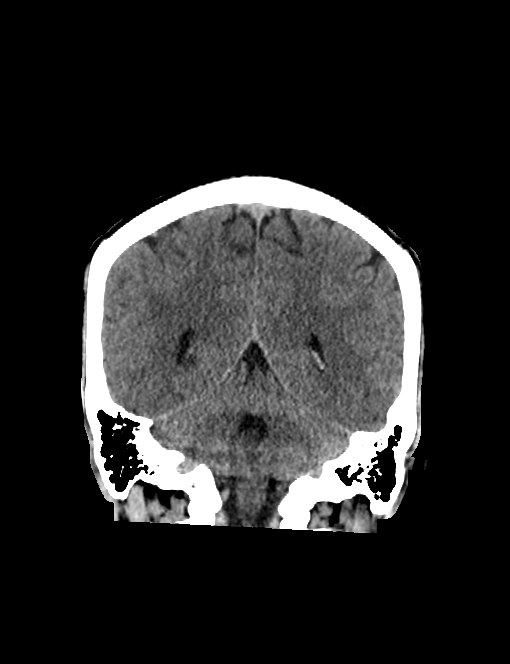
[im 50/65  brain]
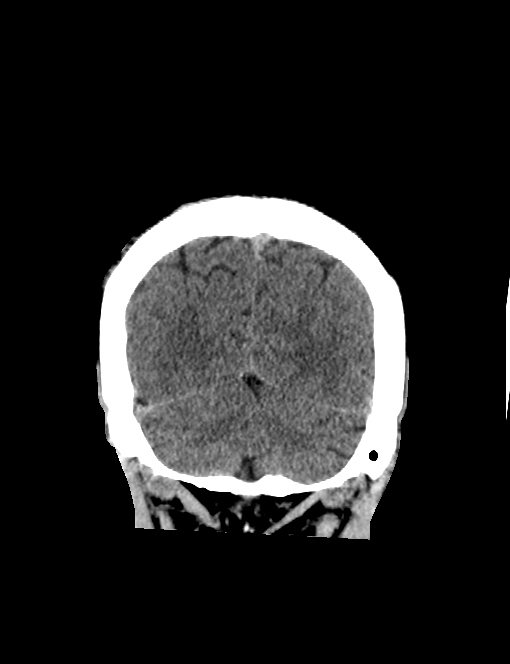
[im 57/65  brain]
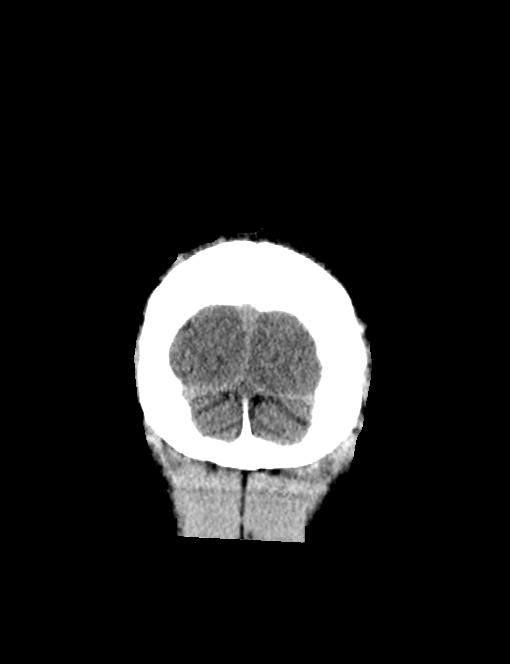

[Series 205: sagittal · sagittal · 0.43mm/px · 3 of 54 slices shown]
[im 18/54  brain]
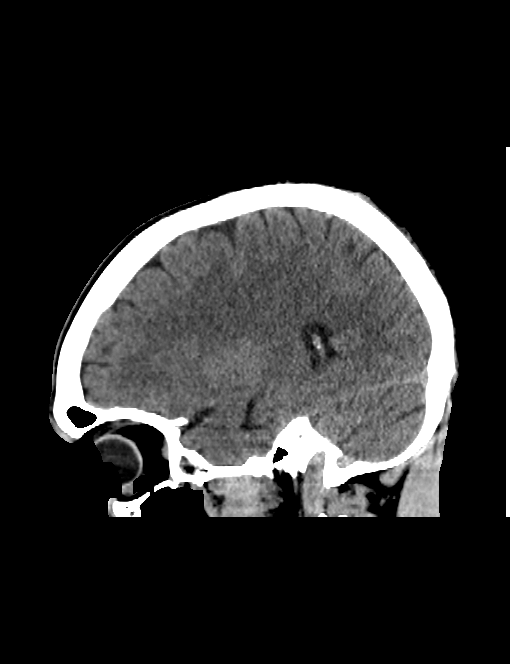
[im 27/54  brain]
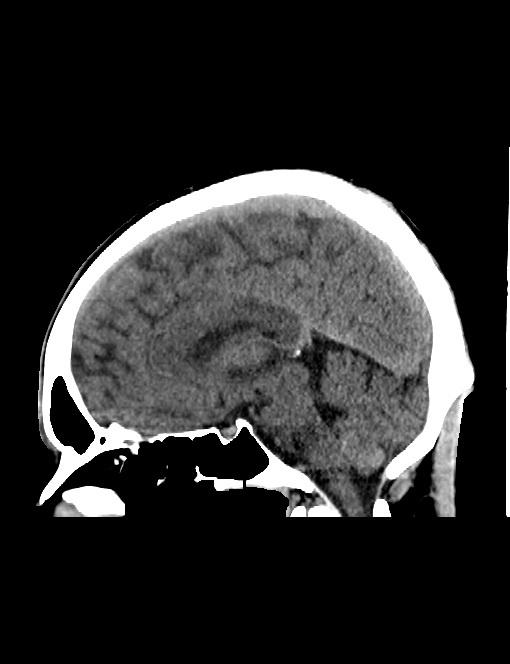
[im 36/54  brain]
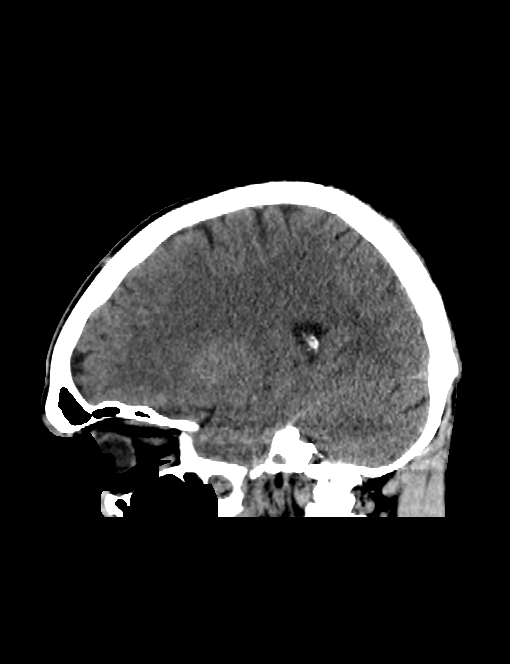

[11 of 47 positions shown; findings below may reference images not displayed]

FINDINGS: There is no acute intracranial hemorrhage or infarct. No mass lesion
or midline shift. Gray-white matter differentiation is well
maintained. Ventricles are normal in size without evidence of
hydrocephalus. CSF containing spaces are within normal limits. No
extra-axial fluid collection.

The calvarium is intact.

Orbital soft tissues are within normal limits.

The paranasal sinuses and mastoid air cells are well pneumatized and
free of fluid.

Scalp soft tissues are unremarkable.
IMPRESSION: Normal head CT.  No acute intracranial process identified.

## 2017-07-19 IMAGING — DX DG CHEST 2V
2 series · 2 of 2 positions shown · non-contrast
Comparison: Chest radiograph November 25, 2015

CLINICAL DATA: Acute onset sharp chest pain, shortness of breath
and LEFT arm pain and numbness. Similar transient symptoms for 4
months.

EXAM:
CHEST  2 VIEW

[chest pa]
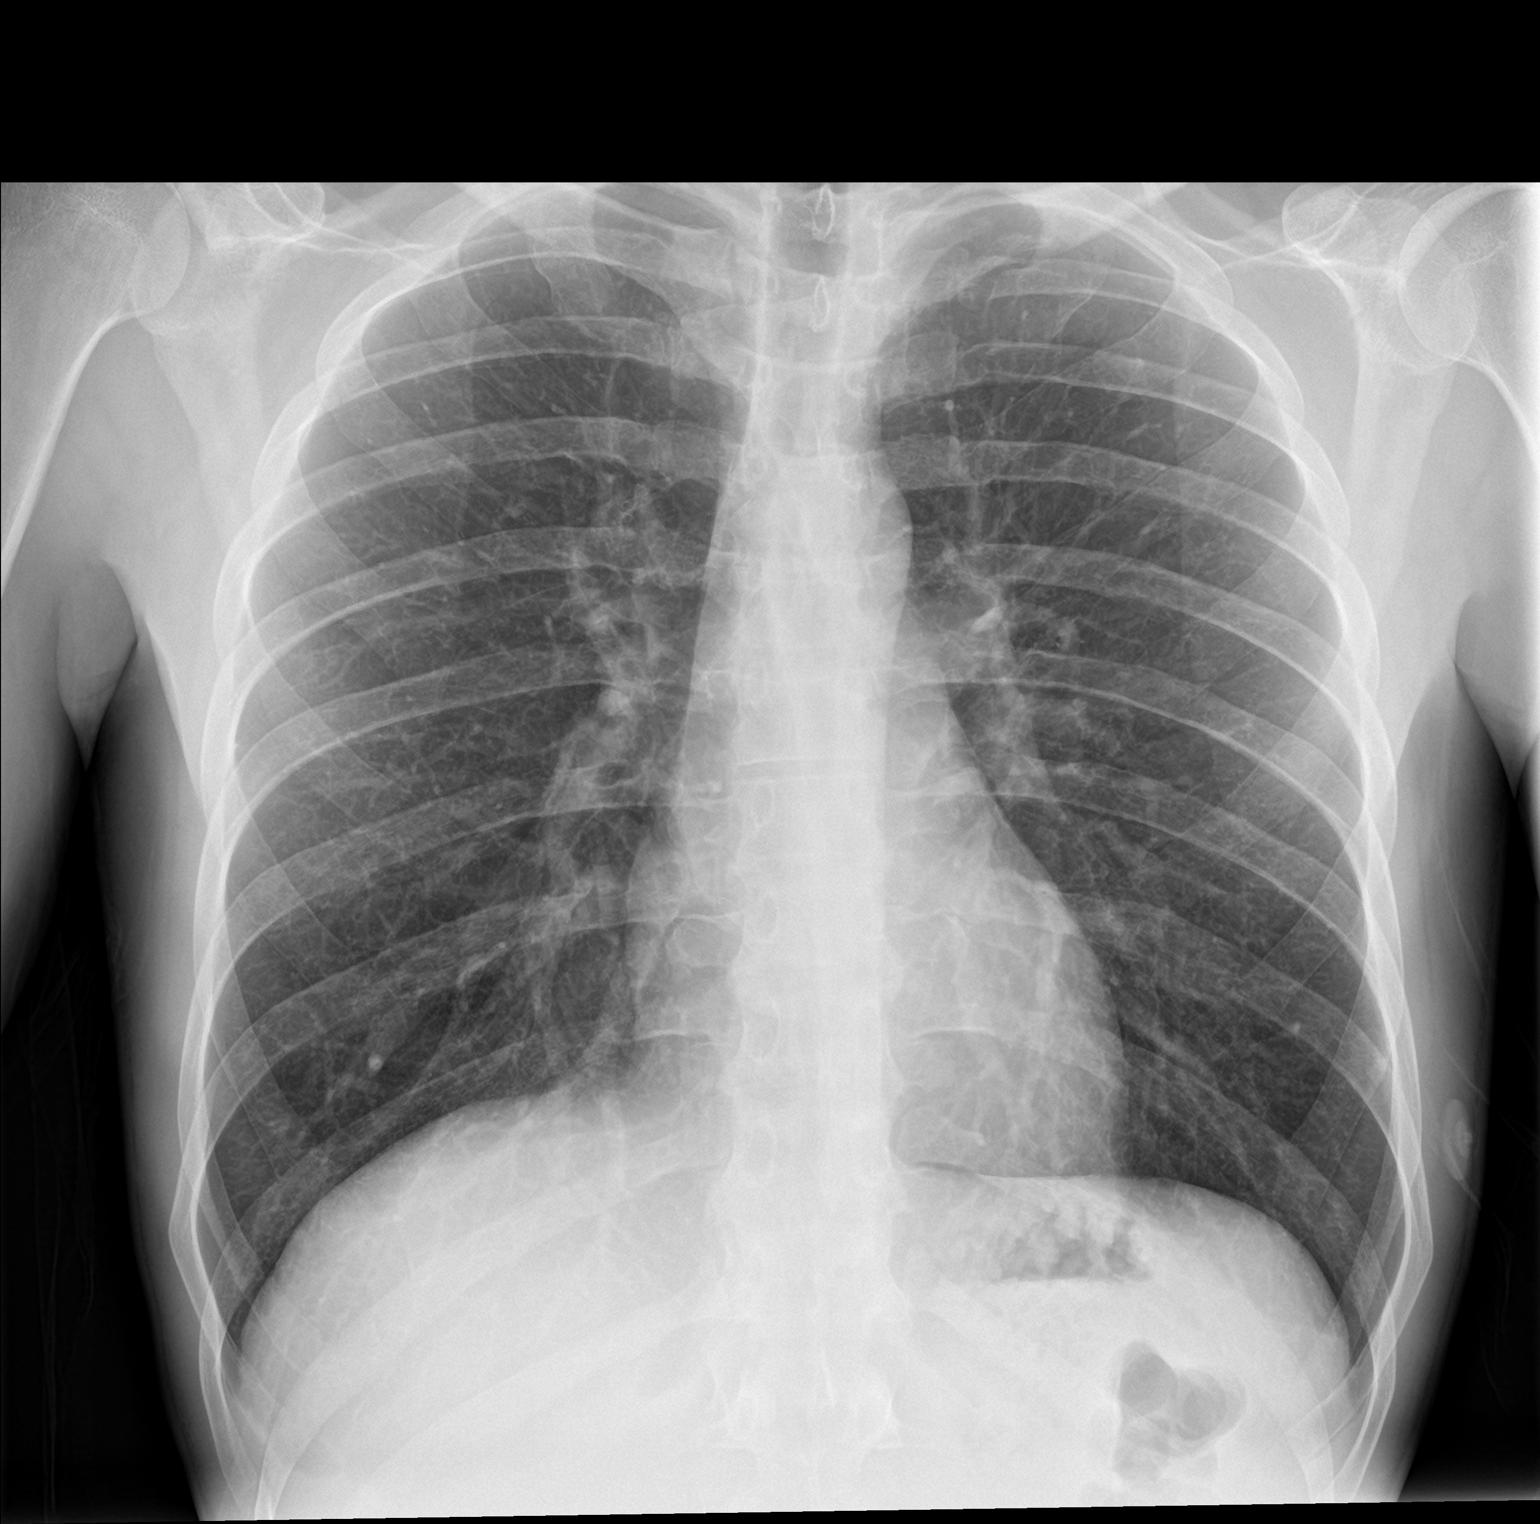

[chest lat]
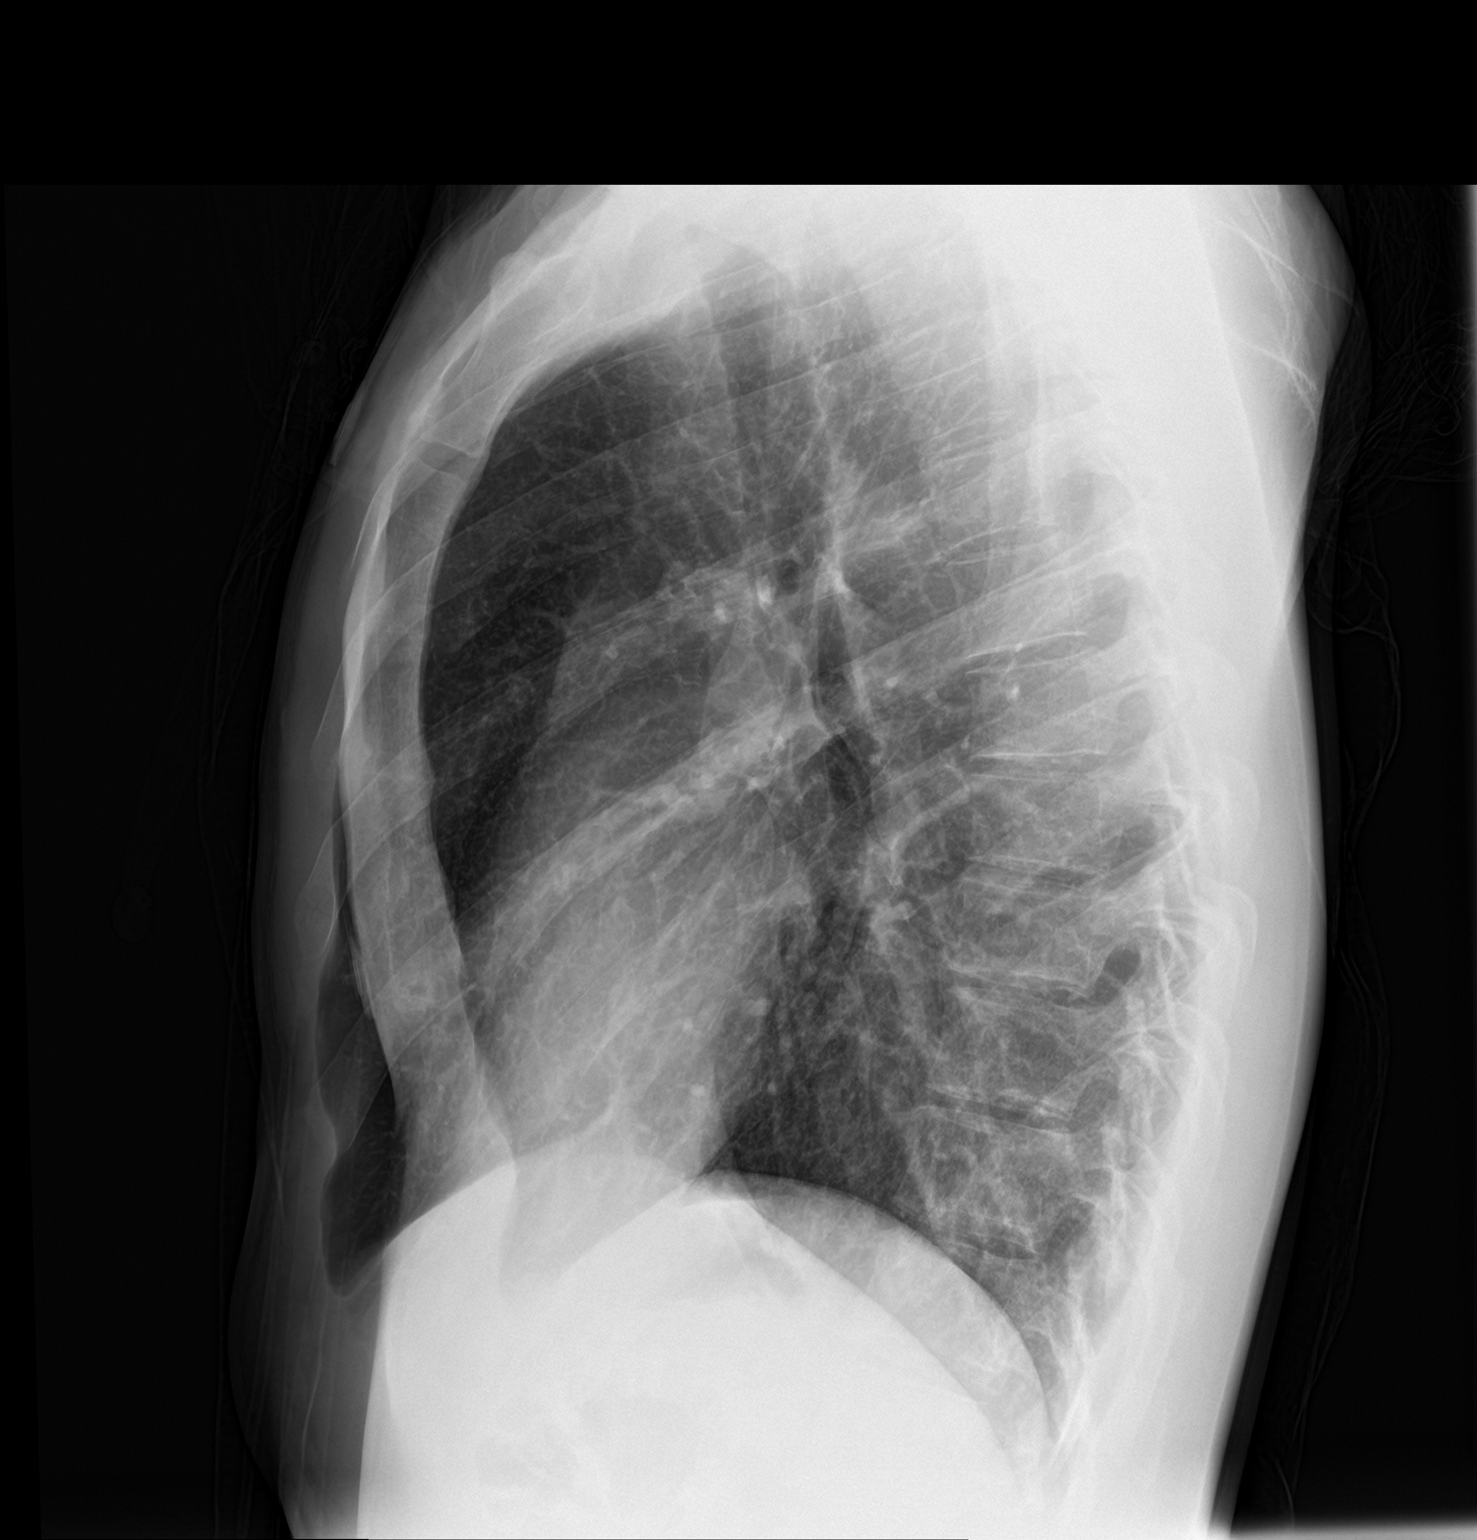

[2 of 2 positions shown; findings below may reference images not displayed]

FINDINGS: Cardiomediastinal silhouette is normal. The lungs are clear without
pleural effusions or focal consolidations. Similar hyperaeration and
mild bronchitic changes. Trachea projects midline and there is no
pneumothorax. Soft tissue planes and included osseous structures are
non-suspicious.
IMPRESSION: Similar hyper aeration hands mild bronchitic changes.

## 2017-07-23 ENCOUNTER — Ambulatory Visit (HOSPITAL_COMMUNITY)
Admission: EM | Admit: 2017-07-23 | Discharge: 2017-07-23 | Disposition: A | Discharge status: Home / Self Care | Payer: Medicaid Other | Attending: Emergency Medicine | Admitting: Emergency Medicine

## 2017-07-23 ENCOUNTER — Ambulatory Visit (INDEPENDENT_AMBULATORY_CARE_PROVIDER_SITE_OTHER): Payer: Medicaid Other

## 2017-07-23 ENCOUNTER — Encounter (HOSPITAL_COMMUNITY): Payer: Self-pay | Admitting: Emergency Medicine

## 2017-07-23 DIAGNOSIS — M542 Cervicalgia: Secondary | ICD-10-CM | POA: Diagnosis not present

## 2017-07-23 DIAGNOSIS — S161XXD Strain of muscle, fascia and tendon at neck level, subsequent encounter: Secondary | ICD-10-CM | POA: Diagnosis not present

## 2017-07-23 MED ORDER — NAPROXEN 500 MG PO TABS: 500.0000 mg | ORAL_TABLET | Freq: Two times a day (BID) | ORAL | 0 refills | Status: DC

## 2017-07-23 NOTE — ED Provider Notes (Signed)
[facility]    CSN: [id number] Arrival date & time: 07/23/17  1325     History   Chief Complaint Chief Complaint  Patient presents with  . Neck Pain  . Numbness    HPI Daniel Nelson is a 32 y.o. male.   Spoke with pt via interp line. States that he has had pain to mid neck intermit for several months now and is causing radiating pain and numbness to RT arm. Denies any injury, no loss of bowels, alert x4,. Has not taken anything that helps.       Past Medical History:  Diagnosis Date  . Headache   . Paresthesia     Patient Active Problem List   Diagnosis Date Noted  . Mood disorder in conditions classified elsewhere 05/27/2017  . Chronic pain syndrome 10/28/2016  . Tension-type headache, not intractable 07/09/2016  . Anxiety state 06/10/2016  . Deviated septum 03/27/2016  . Paresthesias 02/28/2016  . Chronic headaches 01/24/2016  . Epigastric pain 01/24/2016  . Myalgia 11/22/2015  . Seasonal allergies 11/11/2015  . H. pylori infection 08/17/2015  . Otalgia 08/01/2015  . H/O eye surgery 08/01/2015    Past Surgical History:  Procedure Laterality Date  . APPENDECTOMY    . EYE SURGERY Right "as a young child"       Home Medications    Prior to Admission medications   Medication Sig Start Date End Date Taking? Authorizing Provider  amitriptyline (ELAVIL) 25 MG tablet Take 1 tablet (25 mg total) at bedtime by mouth. 05/27/17   Almon Hercules, MD  cetirizine (ZYRTEC) 10 MG tablet Take 1 tablet (10 mg total) by mouth daily. 05/05/17   Linus Mako B, NP  ipratropium (ATROVENT) 0.06 % nasal spray Place 2 sprays into both nostrils 4 (four) times daily. 01/13/17   Deatra Canter, FNP  LORazepam (ATIVAN) 0.5 MG tablet Take 1 tablet (0.5 mg total) by mouth daily as needed for anxiety. 04/09/17   Tobey Grim, MD  methocarbamol (ROBAXIN) 500 MG tablet Take 1 tablet (500 mg total) by mouth 2 (two) times daily. 05/05/17   Georgetta Haber, NP    naproxen (NAPROSYN) 500 MG tablet Take 1 tablet (500 mg total) by mouth 2 (two) times daily. 07/23/17   Coralyn Mark, NP  neomycin-polymyxin-hydrocortisone (CORTISPORIN) 3.5-10000-1 OTIC suspension Place 4 drops into both ears 3 (three) times daily. 01/13/17   Deatra Canter, FNP  ondansetron (ZOFRAN) 4 MG tablet Take 1 tablet (4 mg total) by mouth every 8 (eight) hours as needed for nausea or vomiting. 06/30/17   Georgetta Haber, NP  predniSONE (DELTASONE) 20 MG tablet 3 tabs po day one, then 2 po daily x 4 days 02/05/17   Lurene Shadow, PA-C  SUMAtriptan (IMITREX) 100 MG tablet Take 1 tablet (100 mg total) by mouth every 2 (two) hours as needed for migraine. May repeat in 2 hours if headache persists or recurs. 05/09/17   Elson Areas, PA-C    Family History Family History  Problem Relation Age of Onset  . Rheum arthritis Mother   . Healthy Father   . Colon cancer Neg Hx     Social History Social History   Tobacco Use  . Smoking status: Current Some Day Smoker    Types: Cigarettes  . Smokeless tobacco: Never Used  . Tobacco comment: tobacco info given 01/07/16  Substance Use Topics  . Alcohol use: No    Alcohol/week: 0.0 oz  .  Drug use: No     Allergies   Duloxetine; Tramadol; and Dicyclomine   Review of Systems Review of Systems  Constitutional: Negative.   Respiratory: Negative.   Cardiovascular: Negative.   Gastrointestinal: Negative.   Genitourinary: Negative.   Musculoskeletal: Positive for back pain.       Upper neck cervicale are   Skin: Negative.   Neurological: Positive for numbness.       To RT arm when neck hurts      Physical Exam Triage Vital Signs ED Triage Vitals  Enc Vitals Group     BP 07/23/17 1430 95/65     Pulse Rate 07/23/17 1430 70     Resp 07/23/17 1430 16     Temp 07/23/17 1430 98.1 F (36.7 C)     Temp Source 07/23/17 1430 Oral     SpO2 07/23/17 1430 100 %     Weight 07/23/17 1429 165 lb (74.8 kg)     Height --       Head Circumference --      Peak Flow --      Pain Score 07/23/17 1429 6     Pain Loc --      Pain Edu? --      Excl. in GC? --    No data found.  Updated Vital Signs BP 95/65 (BP Location: Left Arm)   Pulse 70   Temp 98.1 F (36.7 C) (Oral)   Resp 16   Wt 165 lb (74.8 kg)   SpO2 100%   BMI 21.77 kg/m       Physical Exam  Constitutional: He appears well-developed.  Cardiovascular: Normal rate and regular rhythm.  Pulmonary/Chest: Effort normal and breath sounds normal.  Abdominal: Soft. Bowel sounds are normal.  Musculoskeletal: He exhibits tenderness.  C1 and C2   Neurological: He is alert.  Full sensation to digits   Skin: Skin is warm. Capillary refill takes less than 2 seconds.     UC Treatments / Results  Labs (all labs ordered are listed, but only abnormal results are displayed) Labs Reviewed - No data to display  EKG  EKG Interpretation None       Radiology Dg Cervical Spine Complete  Result Date: 07/23/2017 CLINICAL DATA:  Neck pain that started 2 weeks ago EXAM: CERVICAL SPINE - COMPLETE 4+ VIEW COMPARISON:  12/16/2016 CTA of the neck. Limited cervical MRI 02/13/2016. FINDINGS: There is no evidence of cervical spine fracture or prevertebral soft tissue swelling. Alignment is normal. No other significant bone abnormalities are identified. IMPRESSION: Negative cervical spine radiographs. Electronically Signed   By: Marnee SpringJonathon  Watts M.D.   On: 07/23/2017 15:59    Procedures Procedures (including critical care time)  Medications Ordered in UC Medications - No data to display   Initial Impression / Assessment and Plan / UC Course  I have reviewed the triage vital signs and the nursing notes.  Pertinent labs & imaging results that were available during my care of the patient were reviewed by me and considered in my medical decision making (see chart for details).    Reviewed hx and previous charts pt has had this in the past  Has had ct scan  completed with no findings  X ray no change today  Will need to see a neurology for further testing   Will need to take nsaids for pain in neck   Final Clinical Impressions(s) / UC Diagnoses   Final diagnoses:  Neck pain  Acute strain of neck  muscle, subsequent encounter    ED Discharge Orders        Ordered    naproxen (NAPROSYN) 500 MG tablet  2 times daily     07/23/17 1545       Controlled Substance Prescriptions [place] Controlled Substance Registry consulted? Not Applicable   Coralyn Mark, NP 07/23/17 [phone]

## 2017-07-23 NOTE — ED Triage Notes (Signed)
Pt reports right sided neck pain and bilateral arm tingling for 2 weeks. PT also reports dizziness.

## 2017-07-23 NOTE — Discharge Instructions (Signed)
Take pain meds as needed  We did not see any change or injury on the x ray  You will need to see your doctor and follow up with neurology if sx continue

## 2017-08-28 ENCOUNTER — Other Ambulatory Visit: Payer: Self-pay | Admitting: Student

## 2017-08-28 DIAGNOSIS — F0630 Mood disorder due to known physiological condition, unspecified: Secondary | ICD-10-CM

## 2017-08-28 DIAGNOSIS — F411 Generalized anxiety disorder: Secondary | ICD-10-CM

## 2017-09-09 ENCOUNTER — Ambulatory Visit: Payer: Medicaid Other | Admitting: Neurology

## 2017-09-09 ENCOUNTER — Encounter: Payer: Self-pay | Admitting: Neurology

## 2017-09-09 VITALS — BP 105/70 | HR 59 | Ht 73.0 in | Wt 164.0 lb

## 2017-09-09 DIAGNOSIS — R51 Headache: Secondary | ICD-10-CM

## 2017-09-09 DIAGNOSIS — G8929 Other chronic pain: Secondary | ICD-10-CM

## 2017-09-09 MED ORDER — NORTRIPTYLINE HCL 25 MG PO CAPS: 75.0000 mg | ORAL_CAPSULE | Freq: Every day | ORAL | 11 refills | Status: DC

## 2017-09-09 MED ORDER — SUMATRIPTAN SUCCINATE 50 MG PO TABS: 50.0000 mg | ORAL_TABLET | ORAL | 3 refills | Status: DC | PRN

## 2017-09-09 MED ORDER — NORTRIPTYLINE HCL 10 MG PO CAPS: 30.0000 mg | ORAL_CAPSULE | Freq: Every day | ORAL | 5 refills | Status: DC

## 2017-09-09 NOTE — Progress Notes (Signed)
PATIENT: Daniel Nelson DOB: 1986/05/25  Chief Complaint  Patient presents with  . Headache    He is here for his mother, Daniel Nelson and an interpreter from [place].  States he has kept a constant for two years.  He does not medicate his pain.  He will sometimes have blurred vision and/or dizziness.  His PCP prescribed sumatriptan and amitriptyline but he never started either medication.  Marland Kitchen PCP    Daniel Reasons, MD     HISTORICAL  Daniel Nelson is a 32 year old male, seen in refer by his primary care doctor Daniel Nelson for evaluation of chronic headaches, he is accompanied by his mother, and interpreter at today's clinical visit.  He is a refugee from [place],  He complains of frequent headaches since constant daily, and occipital, upper cervical region, over the years, he presented to the emergency room multiple times, had multiple studies in the past.  CT head was normal in May 2018, CT angiogram of head and neck was normal in May 2018  MRI of cervical spine in 2017, mild disc bulging C5-6, no evidence of cord or nerve root compression.  MRA of the brain was normal in July 2017.  Laboratory evaluations in November 2018, normal CBC, he showed no significant abnormality, normal ESR, B12,  He now drives [employer], complains of constant headache, difficulty sleeping, depressed looking,  REVIEW OF SYSTEMS: Full 14 system review of systems performed and notable only for as above  ALLERGIES: Allergies  Allergen Reactions  . Duloxetine     flushing  . Tramadol Other (See Comments)    unknown  . Dicyclomine Anxiety    Potential anxiety and GI distress?    HOME MEDICATIONS: No current outpatient medications on file.   No current facility-administered medications for this visit.     PAST MEDICAL HISTORY: Past Medical History:  Diagnosis Date  . Headache   . Paresthesia     PAST SURGICAL HISTORY: Past Surgical History:  Procedure Laterality Date  .  APPENDECTOMY    . EYE SURGERY Right "as a young child"    FAMILY HISTORY: Family History  Problem Relation Age of Onset  . Rheum arthritis Mother   . Healthy Father   . Colon cancer Neg Hx     SOCIAL HISTORY:  Social History   Socioeconomic History  . Marital status: Married    Spouse name: Not on file  . Number of children: 2  . Years of education: Not on file  . Highest education level: Not on file  Social Needs  . Financial resource strain: Not on file  . Food insecurity - worry: Not on file  . Food insecurity - inability: Not on file  . Transportation needs - medical: Not on file  . Transportation needs - non-medical: Not on file  Occupational History  . Occupation: Teacher, adult education care at [employer]  . Smoking status: Current Some Day Smoker    Types: Cigarettes  . Smokeless tobacco: Never Used  . Tobacco comment: tobacco info given 01/07/16  Substance and Sexual Activity  . Alcohol use: No    Alcohol/week: 0.0 oz  . Drug use: No  . Sexual activity: Not on file  Other Topics Concern  . Not on file  Social History Narrative   [place] refugee -- arrived in the [place] on March 06, 2015.   Lives at home with wife and children.   Right-handed.   Occasional caffeine use.  PHYSICAL EXAM   Vitals:   09/09/17 1013  BP: 105/70  Pulse: (!) 59  Weight: 164 lb (74.4 kg)  Height: '6\' 1"'$  (1.854 m)    Not recorded      Body mass index is 21.64 kg/m.  PHYSICAL EXAMNIATION:  Gen: NAD, conversant, well nourised, obese, well groomed                     Cardiovascular: Regular rate rhythm, no peripheral edema, warm, nontender. Eyes: Conjunctivae clear without exudates or hemorrhage Neck: Supple, no carotid bruits. Pulmonary: Clear to auscultation bilaterally   NEUROLOGICAL EXAM:  MENTAL STATUS: Speech:    Speech is normal; fluent and spontaneous with normal comprehension.  Cognition:     Orientation to time, place and person     Normal  recent and remote memory     Normal Attention span and concentration     Normal Language, naming, repeating,spontaneous speech     Fund of knowledge   CRANIAL NERVES: CN II: Visual fields are full to confrontation. Fundoscopic exam is normal with sharp discs and no vascular changes. Pupils are round equal and briskly reactive to light. CN III, IV, VI: extraocular movement are normal. No ptosis. CN V: Facial sensation is intact to pinprick in all 3 divisions bilaterally. Corneal responses are intact.  CN VII: Face is symmetric with normal eye closure and smile. CN VIII: Hearing is normal to rubbing fingers CN IX, X: Palate elevates symmetrically. Phonation is normal. CN XI: Head turning and shoulder shrug are intact CN XII: Tongue is midline with normal movements and no atrophy.  MOTOR: There is no pronator drift of out-stretched arms. Muscle bulk and tone are normal. Muscle strength is normal.  REFLEXES: Reflexes are 2+ and symmetric at the biceps, triceps, knees, and ankles. Plantar responses are flexor.  SENSORY: Intact to light touch, pinprick, positional sensation and vibratory sensation are intact in fingers and toes.  COORDINATION: Rapid alternating movements and fine finger movements are intact. There is no dysmetria on finger-to-nose and heel-knee-shin.    GAIT/STANCE: Posture is normal. Gait is steady with normal steps, base, arm swing, and turning. Heel and toe walking are normal. Tandem gait is normal.  Romberg is absent.   DIAGNOSTIC DATA (LABS, IMAGING, TESTING) - I reviewed patient records, labs, notes, testing and imaging myself where available.   ASSESSMENT AND PLAN  Daniel Nelson is a 32 y.o. male    Chronic headaches  Starting nortriptyline, titrating from '10mg'$  to 30 mg every night as headache prevention,  Imitrex as needed  Marcial Pacas, M.D. Ph.D.  [facility] [address], [place], [place] [address] Ph: [phone] Fax: [phone]  CC: Daniel Reasons, MD

## 2017-10-26 ENCOUNTER — Telehealth: Payer: Self-pay

## 2017-10-26 DIAGNOSIS — F0630 Mood disorder due to known physiological condition, unspecified: Secondary | ICD-10-CM

## 2017-10-26 NOTE — Telephone Encounter (Signed)
Pt walked in to nurse clinic, called interpreter to assess what patient needed. States he has been waiting on a referral to "mental health clinic" but no one has called him. Will forward to MD

## 2017-10-27 NOTE — Telephone Encounter (Signed)
I have put in a referral to [facility].  If they're not accepting patients, [facility]

## 2017-11-24 ENCOUNTER — Other Ambulatory Visit: Payer: Self-pay

## 2017-11-24 ENCOUNTER — Ambulatory Visit: Payer: Medicaid Other | Admitting: Internal Medicine

## 2017-11-24 VITALS — BP 104/68 | HR 53 | Temp 97.9°F | Ht 73.0 in | Wt 165.0 lb

## 2017-11-24 DIAGNOSIS — R51 Headache: Secondary | ICD-10-CM | POA: Diagnosis not present

## 2017-11-24 DIAGNOSIS — F419 Anxiety disorder, unspecified: Secondary | ICD-10-CM

## 2017-11-24 DIAGNOSIS — G8929 Other chronic pain: Secondary | ICD-10-CM

## 2017-11-24 MED ORDER — ACETAMINOPHEN ER 650 MG PO TBCR: 650.0000 mg | EXTENDED_RELEASE_TABLET | Freq: Three times a day (TID) | ORAL | 0 refills | Status: DC | PRN

## 2017-11-24 NOTE — Progress Notes (Addendum)
   [facility] Phone: [phone]   Date of Visit: 11/24/2017   HPI:  Headaches:  - reports of acute on chronic headaches  - has been see by neurology for this and recommended amitriptyline. He reports only taking one tablet at night time. He did not try to increase to two tablets as recommended on the prescription.  - pain is mainly in the back of the head and radiates to his ears and eye bilaterally. Reports of associated blurred vision, photophobia, phonophobia and nausea at times.  - no recent injury - he has also had a MRI and CT head in the past for his chronic HA which have been normal.  - he asks why he has not been given Tramadol anymore. I discussed that this medication is not for chronic pain and not the best option for headaches  - he currently does not have headache - he also reports that he has been very stressed and on edge. This is a chronic issue but recently has increased. He denies any new stressors. He gets blurred vision mostly when he is anxious. He would like a referral to a psychiatrists as he reports he has a lot of things to discuss. No SI/HI. He reports that he cannot go back to work because of the stress.   ROS: See HPI.  PMFSH:  PMH: Seasonal Allergies Headaches Mood DO   PHYSICAL EXAM: BP 104/68   Pulse (!) 53   Temp 97.9 F (36.6 C) (Oral)   Ht  (1.854 m)   Wt 165 lb (74.8 kg)   SpO2 99%   BMI 21.77 kg/m  GEN: In no distress, sitting comfortably.  HEENT: Atraumatic, normocephalic, neck supple, EOMI, sclera clear, PERRL, no tenderness to palpation of the bilateral temples or TMJ. Tympanic membranes are normal bilaterally  CV: RRR, no murmurs, rubs, or gallops PULM: CTAB, normal effort MSK: no midline cervical tenderness. Mild tenderness of the right and left upper border of the trapezius. Normal flexion of neck without pain. Mild discomfort with extension and rotation.  SKIN: No rash or cyanosis; warm and  well-perfused EXTR: No lower extremity edema or calf tenderness PSYCH: Mood and affect euthymic, normal rate and volume of speech.  NEURO: Awake, alert, no focal deficits grossly, normal speech  ASSESSMENT/PLAN:  Headaches:  Has been seen by neurology for this. Has had brain imaging which have been unremarkable. We discussed the importance of taking the prescribed medication regularly to help prevent headaches. Recommended starting with Amitriptyline  qhs x 1 week, then increase to  qhs the second week, then increase and maintain  qhs starting the third week. His neck pain is muscular in etiology. No concern for intracranial infection.   Anxiety/Stress:  Referral to psychiatry made per patient request.  Palma Holter, MD PGY 3 [facility]

## 2017-11-24 NOTE — Patient Instructions (Addendum)
Start with 1 tablet at  Bedtime for 1 week, then increase to 2 tablets at bedtime for 1 week, then increase to 3 tablets at bedtime starting the third week.   I made a referral to the psychiatrist

## 2017-12-09 ENCOUNTER — Telehealth: Payer: Self-pay | Admitting: Family Medicine

## 2017-12-09 ENCOUNTER — Other Ambulatory Visit: Payer: Self-pay

## 2017-12-09 ENCOUNTER — Ambulatory Visit (HOSPITAL_COMMUNITY)
Admission: EM | Admit: 2017-12-09 | Discharge: 2017-12-09 | Disposition: A | Discharge status: Home / Self Care | Payer: Medicaid Other | Attending: Family Medicine | Admitting: Family Medicine

## 2017-12-09 ENCOUNTER — Encounter (HOSPITAL_COMMUNITY): Payer: Self-pay | Admitting: Emergency Medicine

## 2017-12-09 DIAGNOSIS — G44229 Chronic tension-type headache, not intractable: Secondary | ICD-10-CM | POA: Diagnosis not present

## 2017-12-09 MED ORDER — IBUPROFEN 800 MG PO TABS: 800.0000 mg | ORAL_TABLET | Freq: Three times a day (TID) | ORAL | 0 refills | Status: DC

## 2017-12-09 NOTE — ED Provider Notes (Signed)
[facility]    CSN: [id number] Arrival date & time: 12/09/17  1424     History   Chief Complaint Chief Complaint  Patient presents with  . Headache    HPI Daniel Nelson is a 32 y.o. male.   Daniel presents with complaints of neck and head pain which worsened last night. Arabic interpreter used to collect history and physical. Has not taken any medications for symptoms today. Nausea without vomiting. Has had headache for years now. Has no longer taken nortriptyline as prescribed, states he feels this may have worsened his symptoms. Saw PCP last 5/8. Has seen neurology in the past. Has had negative CT and MRI in the past. Referred to psychiatry at last visit. No vision changes. Some dizziness and lightheadedness. Pain starts to neck and radiates up head.     ROS per HPI.      Past Medical History:  Diagnosis Date  . Headache   . Paresthesia     Patient Active Problem List   Diagnosis Date Noted  . Mood disorder in conditions classified elsewhere 05/27/2017  . Chronic pain syndrome 10/28/2016  . Tension-type headache, not intractable 07/09/2016  . Anxiety state 06/10/2016  . Deviated septum 03/27/2016  . Paresthesias 02/28/2016  . Chronic headaches 01/24/2016  . Epigastric pain 01/24/2016  . Myalgia 11/22/2015  . Seasonal allergies 11/11/2015  . H. pylori infection 08/17/2015  . Otalgia 08/01/2015  . H/O eye surgery 08/01/2015    Past Surgical History:  Procedure Laterality Date  . APPENDECTOMY    . EYE SURGERY Right "as a young child"       Home Medications    Prior to Admission medications   Medication Sig Start Date End Date Taking? Authorizing Provider  acetaminophen (TYLENOL) 650 MG CR tablet Take 1 tablet (650 mg total) by mouth every 8 (eight) hours as needed for pain. 11/24/17   Palma Holter, MD  ibuprofen (ADVIL,MOTRIN) 800 MG tablet Take 1 tablet (800 mg total) by mouth 3 (three) times daily. 12/09/17   Georgetta Haber, NP  nortriptyline (PAMELOR) 10 MG capsule Take 3 capsules (30 mg total) by mouth at bedtime. One tab po qhs xone week, Then 2 tabs qhs xone week, Then 3 tabs qhs  Please disregard previous Rx of Nortriptyline  capsule 09/09/17   Levert Feinstein, MD    Family History Family History  Problem Relation Age of Onset  . Rheum arthritis Mother   . Healthy Father   . Colon cancer Neg Hx     Social History Social History   Tobacco Use  . Smoking status: Current Some Day Smoker    Types: Cigarettes  . Smokeless tobacco: Never Used  . Tobacco comment: tobacco info given 01/07/16  Substance Use Topics  . Alcohol use: No    Alcohol/week: 0.0 oz    Comment: hookah  . Drug use: No     Allergies   Mango flavor; Duloxetine; Tramadol; and Dicyclomine   Review of Systems Review of Systems   Physical Exam Triage Vital Signs ED Triage Vitals  Enc Vitals Group     BP 12/09/17 1443 104/67     Pulse Rate 12/09/17 1443 (!) 58     Resp 12/09/17 1443 18     Temp 12/09/17 1443 98.3 F (36.8 C)     Temp Source 12/09/17 1443 Oral     SpO2 12/09/17 1443 100 %     Weight --      Height --  Head Circumference --      Peak Flow --      Pain Score 12/09/17 1441 4     Pain Loc --      Pain Edu? --      Excl. in GC? --    No data found.  Updated Vital Signs BP 104/67 (BP Location: Left Arm)   Pulse (!) 58   Temp 98.3 F (36.8 C) (Oral)   Resp 18   SpO2 100%    Physical Exam  Constitutional: He is oriented to person, place, and time. He appears well-developed and well-nourished.  HENT:  Head: Normocephalic and atraumatic.  Eyes: Pupils are equal, round, and reactive to light. EOM are normal.  Neck: Muscular tenderness present. No spinous process tenderness present. No neck rigidity. Normal range of motion present.  Cardiovascular: Normal rate and regular rhythm.  Pulmonary/Chest: Effort normal and breath sounds normal.  Lymphadenopathy:    He has no cervical adenopathy.    Neurological: He is alert and oriented to person, place, and time. He has normal strength. He is not disoriented. No cranial nerve deficit or sensory deficit. GCS eye subscore is 4. GCS verbal subscore is 5. GCS motor subscore is 6.  Skin: Skin is warm and dry.     UC Treatments / Results  Labs (all labs ordered are listed, but only abnormal results are displayed) Labs Reviewed - No data to display  EKG None  Radiology No results found.  Procedures Procedures (including critical care time)  Medications Ordered in UC Medications - No data to display  Initial Impression / Assessment and Plan / UC Course  I have reviewed the triage vital signs and the nursing notes.  Pertinent labs & imaging results that were available during my care of the patient were reviewed by me and considered in my medical decision making (see chart for details).     Without red flag findings on exam. Acute on chronic symptoms. Patient declines toradol injection in clinic today, requesting medications to take at home. Inquires about tramadol and discussed that this is not indicated for chronic pain. Discussed ibuprofen with limited use to prevent rebound headache. To continue to follow with PCP and/or neurology. Patient verbalized understanding and agreeable to plan.    Final Clinical Impressions(s) / UC Diagnoses   Final diagnoses:  Chronic tension-type headache, not intractable     Discharge Instructions     Please continue to follow with neurology as well as your primary care doctor to help manage these headaches. May use ibuprofen for pain as needed, please do not take this daily as it can cause rebound headaches.    ED Prescriptions    Medication Sig Dispense Auth. Provider   ibuprofen (ADVIL,MOTRIN) 800 MG tablet Take 1 tablet (800 mg total) by mouth 3 (three) times daily. 21 tablet Georgetta Haber, NP     Controlled Substance Prescriptions [place] Controlled Substance Registry consulted? Not  Applicable   Georgetta Haber, NP 12/09/17 [phone]

## 2017-12-09 NOTE — ED Triage Notes (Signed)
Headache started yesterday, pain into right shoulder/neck

## 2017-12-09 NOTE — Discharge Instructions (Signed)
Please continue to follow with neurology as well as your primary care doctor to help manage these headaches. May use ibuprofen for pain as needed, please do not take this daily as it can cause rebound headaches.

## 2017-12-09 NOTE — Telephone Encounter (Signed)
Patient came in today from Urgent care because they told him to inform us of a change of medications.  I will triage him now, as urgent care saw him and told him to come here asap.

## 2017-12-09 NOTE — Telephone Encounter (Signed)
Pt triaged in RN clinic. [employer] interpreter "Niam" number [phone] provided interpreter services. Pt states he needs his medications changed because he is continuing to get headaches. Denies any SI or HI. Check with Dr. Pollie Meyer about adding pt to overflow vs scheduling with PCP. Advised if no SI or HI to schedule f/u with PCP. Scheduled with Dr. Gwendolyn Grant 12/21/17 at 8:50am. Shawna Orleans, RN

## 2017-12-13 ENCOUNTER — Other Ambulatory Visit: Payer: Self-pay

## 2017-12-13 ENCOUNTER — Encounter (HOSPITAL_COMMUNITY): Payer: Self-pay | Admitting: *Deleted

## 2017-12-13 ENCOUNTER — Encounter (HOSPITAL_COMMUNITY): Payer: Self-pay

## 2017-12-13 ENCOUNTER — Emergency Department (HOSPITAL_COMMUNITY)
Admission: EM | Admit: 2017-12-13 | Discharge: 2017-12-13 | Disposition: A | Discharge status: Home / Self Care | Payer: Medicaid Other | Attending: Emergency Medicine | Admitting: Emergency Medicine

## 2017-12-13 ENCOUNTER — Ambulatory Visit (HOSPITAL_COMMUNITY)
Admission: EM | Admit: 2017-12-13 | Discharge: 2017-12-13 | Disposition: A | Discharge status: Home / Self Care | Payer: Medicaid Other

## 2017-12-13 DIAGNOSIS — R531 Weakness: Secondary | ICD-10-CM | POA: Diagnosis present

## 2017-12-13 DIAGNOSIS — R51 Headache: Secondary | ICD-10-CM | POA: Diagnosis not present

## 2017-12-13 DIAGNOSIS — F17200 Nicotine dependence, unspecified, uncomplicated: Secondary | ICD-10-CM | POA: Insufficient documentation

## 2017-12-13 DIAGNOSIS — R519 Headache, unspecified: Secondary | ICD-10-CM

## 2017-12-13 LAB — CBC
HEMATOCRIT: 43.9 % (ref 39.0–52.0)
Hemoglobin: 14.7 g/dL (ref 13.0–17.0)
MCH: 31.1 pg (ref 26.0–34.0)
MCHC: 33.5 g/dL (ref 30.0–36.0)
MCV: 93 fL (ref 78.0–100.0)
Platelets: 145 10*3/uL — ABNORMAL LOW (ref 150–400)
RBC: 4.72 MIL/uL (ref 4.22–5.81)
RDW: 12 % (ref 11.5–15.5)
WBC: 6.8 10*3/uL (ref 4.0–10.5)

## 2017-12-13 LAB — BASIC METABOLIC PANEL
Anion gap: 6 (ref 5–15)
BUN: 9 mg/dL (ref 6–20)
CALCIUM: 9.2 mg/dL (ref 8.9–10.3)
CO2: 30 mmol/L (ref 22–32)
Chloride: 106 mmol/L (ref 101–111)
Creatinine, Ser: 0.99 mg/dL (ref 0.61–1.24)
GFR calc Af Amer: >60 mL/min (ref >60)
GLUCOSE: 97 mg/dL (ref 65–99)
POTASSIUM: 3.9 mmol/L (ref 3.5–5.1)
Sodium: 142 mmol/L (ref 135–145)

## 2017-12-13 MED ORDER — KETOROLAC TROMETHAMINE 15 MG/ML IJ SOLN
15.0000 mg | Freq: Once | INTRAMUSCULAR | Status: DC
  Filled 2017-12-13: qty 1

## 2017-12-13 NOTE — ED Triage Notes (Addendum)
C/O lightheadedness and weakness x approx 1 wk with "heat sensations in my legs and arms", with worsening over past 2 days.  C/O HA and "off balance" when walking.  Denies sensation of vertigo.  Bilat hand grasps = & strong.  Tongue midline.

## 2017-12-13 NOTE — ED Provider Notes (Signed)
[facility] EMERGENCY DEPARTMENT Provider Note   CSN: [id number] Arrival date & time: 12/13/17  1645     History   Chief Complaint Chief Complaint  Patient presents with  . Weakness  . Dizziness    HPI Daniel Nelson is a 32 y.o. male with a history of headaches, tobacco abuse, anxiety, and chronic pain syndrome who presents to the emergency department with complaints of headache and generally not feeling well for the past 4 days.  Patient describes his discomfort as starting in the neck radiating to the diffuse head.  States it is constant.  Gradual onset, with steady progression, similar to previous headaches.   He has had decreased PO intake as well due to fasting for religious reasons. He states he has had some associated generalized weakness, lightheadedness/off balance sensation, paresthesias to the fingertips, and nausea. He has taken ibuprofen at home with some improvement. No other specific alleviating/aggravating factors. He is not taking the Amitriptyline previously prescribed by neurology for prevention of headaches. Denies change in vision, dizziness like the room spinning, syncope, vomiting, numbness, or seizure activity.    Interpreter utilized throughout Audiological scientist.   HPI  Past Medical History:  Diagnosis Date  . Headache   . Paresthesia     Patient Active Problem List   Diagnosis Date Noted  . Mood disorder in conditions classified elsewhere 05/27/2017  . Chronic pain syndrome 10/28/2016  . Tension-type headache, not intractable 07/09/2016  . Anxiety state 06/10/2016  . Deviated septum 03/27/2016  . Paresthesias 02/28/2016  . Chronic headaches 01/24/2016  . Epigastric pain 01/24/2016  . Myalgia 11/22/2015  . Seasonal allergies 11/11/2015  . H. pylori infection 08/17/2015  . Otalgia 08/01/2015  . H/O eye surgery 08/01/2015    Past Surgical History:  Procedure Laterality Date  . APPENDECTOMY    . EYE SURGERY Right "as a young  child"        Home Medications    Prior to Admission medications   Medication Sig Start Date End Date Taking? Authorizing Provider  acetaminophen (TYLENOL) 650 MG CR tablet Take 1 tablet (650 mg total) by mouth every 8 (eight) hours as needed for pain. Patient not taking: Reported on 12/13/2017 11/24/17   Palma Holter, MD  ibuprofen (ADVIL,MOTRIN) 800 MG tablet Take 1 tablet (800 mg total) by mouth 3 (three) times daily. Patient not taking: Reported on 12/13/2017 12/09/17   Linus Mako B, NP  nortriptyline (PAMELOR) 10 MG capsule Take 3 capsules (30 mg total) by mouth at bedtime. One tab po qhs xone week, Then 2 tabs qhs xone week, Then 3 tabs qhs  Please disregard previous Rx of Nortriptyline  capsule Patient not taking: Reported on 12/13/2017 09/09/17   Levert Feinstein, MD    Family History Family History  Problem Relation Age of Onset  . Rheum arthritis Mother   . Healthy Father   . Colon cancer Neg Hx     Social History Social History   Tobacco Use  . Smoking status: Current Some Day Smoker  . Smokeless tobacco: Never Used  . Tobacco comment: hookah  Substance Use Topics  . Alcohol use: No  . Drug use: No     Allergies   Mango flavor; Duloxetine; Tramadol; and Dicyclomine   Review of Systems Review of Systems  Constitutional: Positive for appetite change. Negative for chills and fever.  Eyes: Negative for visual disturbance.  Respiratory: Negative for shortness of breath.   Cardiovascular: Negative for chest pain.  Musculoskeletal:  Positive for neck pain.  Neurological: Positive for weakness (generalized, non focal) and headaches. Negative for dizziness, seizures, syncope and numbness.       Positive for paresthesias to finger tips  All other systems reviewed and are negative.    Physical Exam Updated Vital Signs BP 131/76   Pulse 62   Temp 98.2 F (36.8 C) (Oral)   Resp 13   SpO2 100%   Physical Exam  Constitutional: He appears  well-developed and well-nourished.  Non-toxic appearance. No distress.  HENT:  Head: Normocephalic and atraumatic.  Right Ear: Tympanic membrane normal.  Left Ear: Tympanic membrane normal.  Nose: Nose normal.  Mouth/Throat: Uvula is midline.  Eyes: Pupils are equal, round, and reactive to light. Conjunctivae and EOM are normal. Right eye exhibits no discharge. Left eye exhibits no discharge.  Neck: Normal range of motion. Neck supple. Muscular tenderness present. No spinous process tenderness present. No neck rigidity.  Cardiovascular: Normal rate and regular rhythm.  No murmur heard. Pulses:      Radial pulses are 2+ on the right side, and 2+ on the left side.       Dorsalis pedis pulses are 2+ on the right side, and 2+ on the left side.  Pulmonary/Chest: Breath sounds normal. No respiratory distress. He has no wheezes. He has no rhonchi. He has no rales.  Abdominal: Soft. He exhibits no distension. There is no tenderness.  Neurological:  Alert. Clear speech. No facial droop. CNIII-XII are intact. Bilateral upper and lower extremities' sensation intact to sharp and dull touch. 5/5 grip strength bilaterally. 5/5 plantar and dorsi flexion bilaterally. Patellar DTRs are 2+ and symmetric . Normal finger to nose bilaterally. Negative pronator drift. Negative Romberg sign. Gait is steady.   Skin: Skin is warm and dry. No rash noted.  Psychiatric: He has a normal mood and affect. His behavior is normal.  Nursing note and vitals reviewed.   ED Treatments / Results  Labs Results for orders placed or performed during the hospital encounter of 12/13/17  Basic metabolic panel  Result Value Ref Range   Sodium 142 135 - 145 mmol/L   Potassium 3.9 3.5 - 5.1 mmol/L   Chloride 106 101 - 111 mmol/L   CO2 30 22 - 32 mmol/L   Glucose, Bld 97 65 - 99 mg/dL   BUN 9 6 - 20 mg/dL   Creatinine, Ser 1.61 0.61 - 1.24 mg/dL   Calcium 9.2 8.9 - 09.6 mg/dL   GFR calc non Af Amer >60 >60 mL/min   GFR calc Af  Amer >60 >60 mL/min   Anion gap 6 5 - 15  CBC  Result Value Ref Range   WBC 6.8 4.0 - 10.5 K/uL   RBC 4.72 4.22 - 5.81 MIL/uL   Hemoglobin 14.7 13.0 - 17.0 g/dL   HCT 04.5 40.9 - 81.1 %   MCV 93.0 78.0 - 100.0 fL   MCH 31.1 26.0 - 34.0 pg   MCHC 33.5 30.0 - 36.0 g/dL   RDW 91.4 78.2 - 95.6 %   Platelets 145 (L) 150 - 400 K/uL   No results found. EKG EKG Interpretation  Date/Time:  Monday Dec 13 2017 16:52:31 EDT Ventricular Rate:  67 PR Interval:  144 QRS Duration: 84 QT Interval:  386 QTC Calculation: 407 R Axis:   93 Text Interpretation:  Normal sinus rhythm Rightward axis No significant change since last tracing Confirmed by Gwyneth Sprout ([id number]) on 12/13/2017 8:01:16 PM   Radiology No results found.  Procedures Procedures (including critical care time)  Medications Ordered in ED Medications  ketorolac (TORADOL) 15 MG/ML injection 15 mg (has no administration in time range)    Initial Impression / Assessment and Plan / ED Course  I have reviewed the triage vital signs and the nursing notes.  Pertinent labs & imaging results that were available during my care of the patient were reviewed by me and considered in my medical decision making (see chart for details).    Patient presents with complaint of headache and associated general malaise. Patient is nontoxic appearing, in no apparent distress, vitals without significant abnormalities. Patient has hx of similar headaches, gradual onset with steady progression in severity- non concerning for [facility], ICH, ischemic CVA, dural venous sinus thrombosis, acute glaucoma, giant cell arteritis, mass, or meningitis. Pt is afebrile with no focal neuro deficits, dizziness like room spinning, change in vision, proptosis, or nuchal rigidity. Patient with general malaise with generalized weakness in setting of fasting for religious purposes. Labs grossly unremarkable- thrombocytopenia at 145 similar to previous. EKG without significant  change from previous. He is requesting DC at time of my evaluation- discussed treatment options in the ER and observation and patient adamant about DC home, requesting suggestions for adult vitamins. Will give IM toradol with DC home with neurology follow up. Encouraged continued use of ibuprofen starting tomorrow as this has helped previously. I discussed treatment plan, need for PCP and/or neurology follow-up, and return precautions with the patient. Provided opportunity for questions, patient confirmed understanding and is in agreement with plan.     Findings and plan of care discussed with supervising physician Dr. Anitra Lauth who is in agreement.    Final Clinical Impressions(s) / ED Diagnoses   Final diagnoses:  Nonintractable headache, unspecified chronicity pattern, unspecified headache type    ED Discharge Orders    None       Cherly Anderson, PA-C 12/14/17 0143    Gwyneth Sprout, MD 12/14/17 2248

## 2017-12-13 NOTE — ED Notes (Signed)
Discussed sxs with Dr Lum Babe: instructed to have pt go to ED.  Pt notified via interpreter; verbalized understanding.

## 2017-12-13 NOTE — Discharge Instructions (Addendum)
You were seen in the emergency department today for a headache, generally not feeling well, with some lightheadedness.  You were given a shot of anti-inflammatory medication to help with the headache. Please resume taking ibuprofen for discomfort tomorrow as needed per your previous prescription.   Centrum is an option for an adult vitamin. Please ask your pharmacist for further recommendations and options for adult vitamins.   Follow-up with your primary care provider or your neurologist Dr. Terrace Arabia in the next 2 to 3 days.  Return to the ER for new or worsening symptoms or any other concerns.

## 2017-12-13 NOTE — ED Triage Notes (Signed)
Pt presents for evaluation of dizziness, weakness, and generalized tingling sensations to bilateral arms and legs. States feels off balance and has headache. States worse x 2 days. Sent from [facility]

## 2017-12-13 NOTE — ED Notes (Signed)
ED Provider at bedside. 

## 2017-12-13 NOTE — ED Notes (Signed)
Pt. Alert and in room with family @ bedside

## 2017-12-21 ENCOUNTER — Ambulatory Visit: Payer: Medicaid Other | Admitting: Family Medicine

## 2017-12-21 ENCOUNTER — Other Ambulatory Visit: Payer: Self-pay

## 2017-12-21 ENCOUNTER — Encounter: Payer: Self-pay | Admitting: Family Medicine

## 2017-12-21 VITALS — BP 100/66 | HR 53 | Temp 97.8°F | Ht 73.0 in | Wt 166.6 lb

## 2017-12-21 DIAGNOSIS — J029 Acute pharyngitis, unspecified: Secondary | ICD-10-CM | POA: Diagnosis not present

## 2017-12-21 DIAGNOSIS — F411 Generalized anxiety disorder: Secondary | ICD-10-CM

## 2017-12-21 DIAGNOSIS — M542 Cervicalgia: Secondary | ICD-10-CM | POA: Diagnosis present

## 2017-12-21 MED ORDER — TRAMADOL HCL 50 MG PO TABS: 50.0000 mg | ORAL_TABLET | Freq: Three times a day (TID) | ORAL | 0 refills | Status: DC | PRN

## 2017-12-21 NOTE — Patient Instructions (Signed)
It was good to see you today.  Take the Amoxicillin twice a day for sore throat.   Come back and see me in a month.  If you are still having trouble with anxiety even though the pain is better, we can start a medicine then.    I will refer you to physical therapy for your neck.

## 2017-12-21 NOTE — Progress Notes (Signed)
Subjective:    Daniel Nelson is a 32 y.o. male who presents to [facility] today for sore throat and anxiety:  1.  Sore throat:  Present for past several days.  No other symptoms -- no cough, no coryza.  Questionable fevers.  No chills.  Some mild nausea without vomiting.  Eating and drinking normally.  NO diarrhea.  NO sick contacts.  No trouble with seasonal allergies.  NO new foods.    2.  Anxiety:  Also with neck pain.  Longtanding issues with anxiety.  Reports "they keep giving my depression medicine but I'm not depressed.  I get out of the house."  Feels anxious when at home.  Better when at work or with other people.    Neck pain is sharp stabbing pain between shoulder blades and extending to BL neck.  Occasional tingling along RIght arm.  No weakness.  He is asking for imaging to see if anything has worsened. No trauma to his neck.  No fevers.  No neck stiffness.  Present for months to years, intermittent exacerbations.    ROS as above per HPI.    The following portions of the patient's history were reviewed and updated as appropriate: allergies, current medications, past medical history, family and social history, and problem list. Patient is a nonsmoker.    PMH reviewed.  Past Medical History:  Diagnosis Date  . Headache   . Paresthesia    Past Surgical History:  Procedure Laterality Date  . APPENDECTOMY    . EYE SURGERY Right "as a young child"    Medications reviewed. No current outpatient medications on file.   No current facility-administered medications for this visit.      Objective:   Physical Exam BP 100/66   Pulse (!) 53   Temp 97.8 F (36.6 C) (Oral)   Ht 6\' 1"  (1.854 m)   Wt 166 lb 9.6 oz (75.6 kg)   SpO2 98%   BMI 21.98 kg/m  Gen:  Patient sitting on exam table, appears stated age in no acute distress Head: Normocephalic atraumatic Eyes: EOMI, PERRL, sclera and conjunctiva non-erythematous Ears:  Canals clear bilaterally.  TMs pearly gray  bilaterally without erythema or bulging.   Nose:  Nasal turbinates normal  Mouth: Mucosa membranes moist. Tonsils +3 (R>L) with some exudates noted.  Neck: LAD noted.  Heart:  RRR, no murmurs auscultated. Pulm:  Clear to auscultation bilaterally with good air movement.  No wheezes or rales noted.   MSK:  TTP along paracervical muscles of neck BL.  Decreased forward flexion and extension, looking left and right due to tightness.  No true stiffness.  Poor muscle tone of shoulder/neck muscles Neuro:  Sensation fully intact BL upper and lower extremities.  Strength 5/5 BL UE's.  CV:  Good radial pulses BL wrists. Psych:  Not anxious or depressed currently.  NO SI/HI  Imp/Plan: 1.  Sore throat: - declined strep swab.  Tx as such.  Centor criteria high  2.  Neck pain: - neck spasm secondary to poor tone/muscle mass - think PT will be most beneficial to him - No red flags.  - tramadol as pain relief.   - heat and massage for relief.   - FU in 1 month  3.  Anxiety:  - think a medication might help -- but also thinks that the pain might be what is triggering his anxiety.   - FU in 1 month.  IF still with anxiety that visit, will re-address buspar or  another medication specifically for anxiety - doesn't want to be on an anti-depressive medication.  Also declined any counseling

## 2018-01-10 ENCOUNTER — Ambulatory Visit: Payer: Medicaid Other | Admitting: Neurology

## 2018-01-10 NOTE — Progress Notes (Deleted)
[facility]  PATIENT: Daniel Nelson DOB: 1985-08-06   REASON FOR VISIT: Follow-up for headache/neck pain HISTORY FROM:    HISTORY OF PRESENT ILLNESS: 09/09/17 Daniel Nelson is a 32 year old male, seen in refer by his primary care doctor Esmond Camper for evaluation of chronic headaches, he is accompanied by his mother, and interpreter at today's clinical visit.  He is a refugee from [place],  He complains of frequent headaches since constant daily, and occipital, upper cervical region, over the years, he presented to the emergency room multiple times, had multiple studies in the past.  CT head was normal in May 2018, CT angiogram of head and neck was normal in May 2018  MRI of cervical spine in 2017, mild disc bulging C5-6, no evidence of cord or nerve root compression.  MRA of the brain was normal in July 2017.  Laboratory evaluations in November 2018, normal CBC, he showed no significant abnormality, normal ESR, B12,  He now drives [employer], complains of constant headache, difficulty sleeping, depressed looking   REVIEW OF SYSTEMS: Full 14 system review of systems performed and notable only for those listed, all others are neg:  Constitutional: neg  Cardiovascular: neg Ear/Nose/Throat: neg  Skin: neg Eyes: neg Respiratory: neg Gastroitestinal: neg  Hematology/Lymphatic: neg  Endocrine: neg Musculoskeletal:neg Allergy/Immunology: neg Neurological: neg Psychiatric: neg Sleep : neg   ALLERGIES: Allergies  Allergen Reactions  . Mango Flavor Swelling  . Duloxetine Other (See Comments)    flushing  . Tramadol Other (See Comments)    Unknown patient states not allergic  . Dicyclomine Anxiety    Potential anxiety and GI distress?    HOME MEDICATIONS: Outpatient Medications Prior to Visit  Medication Sig Dispense Refill  . traMADol (ULTRAM) 50 MG tablet Take 1 tablet (50 mg total) by mouth every 8 (eight) hours as needed. 30  tablet 0   No facility-administered medications prior to visit.     PAST MEDICAL HISTORY: Past Medical History:  Diagnosis Date  . Headache   . Paresthesia     PAST SURGICAL HISTORY: Past Surgical History:  Procedure Laterality Date  . APPENDECTOMY    . EYE SURGERY Right "as a young child"    FAMILY HISTORY: Family History  Problem Relation Age of Onset  . Rheum arthritis Mother   . Healthy Father   . Colon cancer Neg Hx     SOCIAL HISTORY: Social History   Socioeconomic History  . Marital status: Married    Spouse name: Not on file  . Number of children: 2  . Years of education: Not on file  . Highest education level: Not on file  Occupational History  . Occupation: Teacher, adult education care at [employer]  . Financial resource strain: Not on file  . Food insecurity:    Worry: Not on file    Inability: Not on file  . Transportation needs:    Medical: Not on file    Non-medical: Not on file  Tobacco Use  . Smoking status: Former Research scientist (life sciences)  . Smokeless tobacco: Never Used  . Tobacco comment: hookah  Substance and Sexual Activity  . Alcohol use: No  . Drug use: No  . Sexual activity: Not on file  Lifestyle  . Physical activity:    Days per week: Not on file    Minutes per session: Not on file  . Stress: Not on file  Relationships  . Social connections:    Talks on phone: Not on file  Gets together: Not on file    Attends religious service: Not on file    Active member of club or organization: Not on file    Attends meetings of clubs or organizations: Not on file    Relationship status: Not on file  . Intimate partner violence:    Fear of current or ex partner: Not on file    Emotionally abused: Not on file    Physically abused: Not on file    Forced sexual activity: Not on file  Other Topics Concern  . Not on file  Social History Narrative   [place] refugee -- arrived in the [place] on March 06, 2015.   Lives at home with wife and  children.   Right-handed.   Occasional caffeine use.     PHYSICAL EXAM  There were no vitals filed for this visit. There is no height or weight on file to calculate BMI.  Generalized: Well developed, in no acute distress  Head: normocephalic and atraumatic,. Oropharynx benign  Neck: Supple, no carotid bruits  Cardiac: Regular rate rhythm, no murmur  Musculoskeletal: No deformity   Neurological examination   Mentation: Alert oriented to time, place, history taking. Attention span and concentration appropriate. Recent and remote memory intact.  Follows all commands speech and language fluent.   Cranial nerve II-XII: Fundoscopic exam reveals sharp disc margins.Pupils were equal round reactive to light extraocular movements were full, visual field were full on confrontational test. Facial sensation and strength were normal. hearing was intact to finger rubbing bilaterally. Uvula tongue midline. head turning and shoulder shrug were normal and symmetric.Tongue protrusion into cheek strength was normal. Motor: normal bulk and tone, full strength in the BUE, BLE, fine finger movements normal, no pronator drift. No focal weakness Sensory: normal and symmetric to light touch, pinprick, and  Vibration, proprioception  Coordination: finger-nose-finger, heel-to-shin bilaterally, no dysmetria Reflexes: Brachioradialis 2/2, biceps 2/2, triceps 2/2, patellar 2/2, Achilles 2/2, plantar responses were flexor bilaterally. Gait and Station: Rising up from seated position without assistance, normal stance,  moderate stride, good arm swing, smooth turning, able to perform tiptoe, and heel walking without difficulty. Tandem gait is steady  DIAGNOSTIC DATA (LABS, IMAGING, TESTING) - I reviewed patient records, labs, notes, testing and imaging myself where available.  Lab Results  Component Value Date   WBC 6.8 12/13/2017   HGB 14.7 12/13/2017   HCT 43.9 12/13/2017   MCV 93.0 12/13/2017   PLT 145 (L)  12/13/2017      Component Value Date/Time   NA 142 12/13/2017 1659   K 3.9 12/13/2017 1659   CL 106 12/13/2017 1659   CO2 30 12/13/2017 1659   GLUCOSE 97 12/13/2017 1659   BUN 9 12/13/2017 1659   CREATININE 0.99 12/13/2017 1659   CREATININE 0.91 07/28/2016 1551   CALCIUM 9.2 12/13/2017 1659   PROT 7.2 05/25/2017 1219   ALBUMIN 4.3 05/25/2017 1219   AST 14 (L) 05/25/2017 1219   ALT 10 (L) 05/25/2017 1219   ALKPHOS 43 05/25/2017 1219   BILITOT 1.0 05/25/2017 1219   GFRNONAA >60 12/13/2017 1659   GFRAA >60 12/13/2017 1659   No results found for: CHOL, HDL, LDLCALC, LDLDIRECT, TRIG, CHOLHDL No results found for: HGBA1C Lab Results  Component Value Date   VITAMINB12 265 02/05/2017   Lab Results  Component Value Date   TSH 0.90 07/28/2016      ASSESSMENT AND PLAN  32 y.o. year old male  has a past medical history of Headache and  Paresthesia. here with ***  Daniel Nelson is a 32 y.o. male    Chronic headaches             Starting nortriptyline, titrating from 20m to 30 mg every night as headache prevention,             Imitrex as needed    NDennie Nelson [facility] [address] [place]  [phone]

## 2018-01-11 ENCOUNTER — Ambulatory Visit: Payer: Medicaid Other | Admitting: Family Medicine

## 2018-01-11 ENCOUNTER — Ambulatory Visit: Payer: Self-pay | Admitting: Nurse Practitioner

## 2018-01-11 ENCOUNTER — Encounter: Payer: Self-pay | Admitting: Family Medicine

## 2018-01-11 ENCOUNTER — Other Ambulatory Visit: Payer: Self-pay

## 2018-01-11 VITALS — BP 102/56 | HR 58 | Temp 98.1°F | Ht 73.0 in | Wt 165.8 lb

## 2018-01-11 DIAGNOSIS — F411 Generalized anxiety disorder: Secondary | ICD-10-CM | POA: Diagnosis not present

## 2018-01-11 DIAGNOSIS — F450 Somatization disorder: Secondary | ICD-10-CM

## 2018-01-11 MED ORDER — BUSPIRONE HCL 10 MG PO TABS: ORAL_TABLET | ORAL | 1 refills | Status: DC

## 2018-01-11 NOTE — Progress Notes (Signed)
Subjective:   Nivein Arabic interpreter [phone] used for entire visit:  Daniel Nelson is a 32 y.o. male who [facility] mood issues:  1.  Mood issues: Patient continues to have anxiety and agoraphobia type symptoms.  Occur every day.  When he leaves the house he also has a family member with him even if it is his young child.  Generalized concerns no real perceived threats to his safety.  Denies any nightmares.  Occasionally has palpitations.  This also sometimes keeps him from working although he does continue to work.  No suicidal homicidal ideations.  No similar symptoms in any of his immediate family members.  ROS as above per HPI.    The following portions of the patient's history were reviewed and updated as appropriate: allergies, current medications, past medical history, family and social history, and problem list. Patient is a nonsmoker.    PMH reviewed.  Past Medical History:  Diagnosis Date  . Headache   . Paresthesia    Past Surgical History:  Procedure Laterality Date  . APPENDECTOMY    . EYE SURGERY Right "as a young child"    Medications reviewed. Current Outpatient Medications  Medication Sig Dispense Refill  . traMADol (ULTRAM) 50 MG tablet Take 1 tablet (50 mg total) by mouth every 8 (eight) hours as needed. 30 tablet 0   No current facility-administered medications for this visit.      Objective:   Physical Exam BP (!) 102/56   Pulse (!) 58   Temp 98.1 F (36.7 C) (Oral)   Ht 6\' 1"  (1.854 m)   Wt 165 lb 12.8 oz (75.2 kg)   SpO2 98%   BMI 21.87 kg/m  Gen:  Alert, cooperative patient who appears stated age in no acute distress.  Vital signs reviewed. HEENT: EOMI,  MMM Neck: No thyromegaly Cardiac:  Regular rate and rhythm without murmur auscultated.  Good S1/S2. Pulm:  Clear to auscultation bilaterally with good air movement.  No wheezes or rales noted.   Neuro: Alert and oriented.  No focal deficits Psych: Does appear anxious  at times today but overall normal-appearing with linear and coherent thought process.  No hallucinations.  No tangential speech.  No results found for this or any previous visit (from the past 72 hour(s)).

## 2018-01-11 NOTE — Patient Instructions (Addendum)
I will refer you to a therapist.  You can also try calling.  It will be [facility]: Address: [address], [place], [place] [address] Phone: [phone]  Take the Buspar 1 pill a day for 3 days at night, then 1 pill twice a day.    Come back and see me in a month to make sure you're doing okay.

## 2018-01-12 ENCOUNTER — Encounter: Payer: Self-pay | Admitting: Family Medicine

## 2018-01-12 ENCOUNTER — Encounter: Payer: Self-pay | Admitting: Nurse Practitioner

## 2018-01-12 NOTE — Assessment & Plan Note (Signed)
I have switched him to BuSpar started today.  He has not wanted to take anything that sounds like an antidepressant. -He also is very interested in counseling.  [facility] multiple referrals to come back for health in the past so not gone anywhere. -I will also provide him with the address and phone number to call for himself to help with him finding an appointment. -Follow-up in 2 weeks to assess for improvement.

## 2018-01-28 ENCOUNTER — Ambulatory Visit: Payer: Medicaid Other | Admitting: Physical Therapy

## 2018-02-03 ENCOUNTER — Ambulatory Visit (HOSPITAL_COMMUNITY)
Admission: EM | Admit: 2018-02-03 | Discharge: 2018-02-03 | Disposition: A | Discharge status: Home / Self Care | Payer: Self-pay

## 2018-02-08 ENCOUNTER — Other Ambulatory Visit: Payer: Self-pay

## 2018-02-08 ENCOUNTER — Encounter: Payer: Self-pay | Admitting: Family Medicine

## 2018-02-08 ENCOUNTER — Ambulatory Visit (INDEPENDENT_AMBULATORY_CARE_PROVIDER_SITE_OTHER): Payer: Medicaid Other | Admitting: Family Medicine

## 2018-02-08 VITALS — BP 98/60 | HR 53 | Temp 97.9°F | Ht 73.0 in | Wt 166.4 lb

## 2018-02-08 DIAGNOSIS — M542 Cervicalgia: Secondary | ICD-10-CM | POA: Diagnosis not present

## 2018-02-08 NOTE — Patient Instructions (Signed)
Go to the hospital to have your x-ray.  Use the Biofreeze gel over the counter for pain relief.    You should also use ice and ibuprofen for pain relief.    It was good to see you today

## 2018-02-08 NOTE — Progress Notes (Signed)
Subjective:    Daniel Nelson is a 32 y.o. male who [facility] for shoulder/neck pain:  1.  Neck and shoulder pain:  Present for about 5 days.  Picked up container of water, which was heavy, at work and started having pain afterwards.  Describes as a sharp stabbing pain in his right side of his neck.  Difficulty turning his head left and right without having pain.  However he has no actual neck stiffness.  No headaches.  No fevers or chills.  No recent illnesses.  No lower extremity weakness or numbness.  No bladder or bowel incontinence.  He has been taking Tylenol without much relief.  He is asking for some sort of cream to help with the pain.  He occasionally has some numbness and tingling that radiates to his right arm and hand.  As for his ongoing anxiety issues, he states this is much better.  He did not try the BuSpar.  He has been working on being outside with family members.  He is able to play soccer as well as work without any further issues of anxiety.  He would like to hold off on any behavioral health consultation.  No suicidal or homicidal ideations.   The following portions of the patient's history were reviewed and updated as appropriate: allergies, current medications, past medical history, family and social history, and problem list. Patient is a nonsmoker.    PMH reviewed.  Past Medical History:  Diagnosis Date  . Headache   . Paresthesia    Past Surgical History:  Procedure Laterality Date  . APPENDECTOMY    . EYE SURGERY Right "as a young child"    Medications reviewed. Current Outpatient Medications  Medication Sig Dispense Refill  . busPIRone (BUSPAR) 10 MG tablet Take 1 tab daily x 3 days, then 1 tab po BID 60 tablet 1  . traMADol (ULTRAM) 50 MG tablet Take 1 tablet (50 mg total) by mouth every 8 (eight) hours as needed. 30 tablet 0   No current facility-administered medications for this visit.      Objective:   Physical Exam BP  98/60   Pulse (!) 53   Temp 97.9 F (36.6 C) (Oral)   Ht 6\' 1"  (1.854 m)   Wt 166 lb 6.4 oz (75.5 kg)   SpO2 99%   BMI 21.95 kg/m  Gen:  Alert, cooperative patient who appears stated age in no acute distress.  Vital signs reviewed. HEENT: EOMI,  MMM Cardiac:  Regular rate and rhythm without murmur auscultated.  Good S1/S2. Pulm:  Clear to auscultation bilaterally with good air movement.  No wheezes or rales noted.   MSK: Full range of motion of neck.  He does have some limits on the ends of range of motion both looking right and left due to pain on the right-hand side.  He is a palpable muscle spasm in the subscapular area on the right-hand side. Neuro:  Sensation fully intact BL hands and arms.  Strength 5/5 BL UE's  Impression/plan: 1.  Neck spasm: -Plan to treat with ibuprofen, ice.  He can stop the Tylenol as this is not helping. -He can try Biofreeze over-the-counter gel for pain relief. -He would like to have x-ray to ensure there is "nothing going wrong."  Low suspicion for this.   -Home physical therapy. -

## 2018-02-09 ENCOUNTER — Encounter: Payer: Self-pay | Admitting: Family Medicine

## 2018-02-11 ENCOUNTER — Ambulatory Visit: Payer: Medicaid Other | Attending: Family Medicine | Admitting: Physical Therapy

## 2018-02-11 ENCOUNTER — Other Ambulatory Visit: Payer: Self-pay

## 2018-02-11 DIAGNOSIS — M542 Cervicalgia: Secondary | ICD-10-CM | POA: Insufficient documentation

## 2018-02-11 NOTE — Patient Instructions (Signed)
  Flexibility: Upper Trapezius Stretch   Gently grasp right side of head while reaching behind back with other hand. Tilt head away until a gentle stretch is felt. Hold 30 seconds. Repeat 3 times per set. Do 2 sessions per day.  http://orth.exer.us/340   Levator Stretch   Grasp seat or sit on hand on side to be stretched. Turn head toward other side and look down. Use hand on head to gently stretch neck in that position. Hold _30___ seconds. Repeat on other side. Repeat 3 times. Do 2 sessions per day.  http://[email or web address].exer.us/30   Scapular Retraction (Standing)   With arms at sides, pinch shoulder blades together. Repeat 10 times per set. Do 1-3 sets per session. Do 2 sessions per day.  http://orth.exer.us/944     Flexibility: Neck Retraction   Pull head straight back, keeping eyes and jaw level. Hold 3-5 seconds. Repeat _10 times per set. Do 3-5  sessions per day.  http://orth.exer.us/344   Posture - Sitting   Sit upright, head facing forward. Try using a roll to support lower back. Keep shoulders relaxed, and avoid rounded back. Keep hips level with knees. Avoid crossing legs for long periods.  Daniel Nelson, PT 02/11/18 11:34 AM; [facility] [address] [place], [place], [address] Phone: [phone]   Fax:  [phone]

## 2018-02-11 NOTE — Therapy (Signed)
[facility] [address] [place], [place], [address] Phone: [phone]   Fax:  [phone]  Physical Therapy Evaluation  Patient Details  Name: Daniel Nelson MRN: [id number] Date of Birth: May 28, 1986 Referring Provider: Gwendolyn Grant   Encounter Date: 02/11/2018  PT End of Session - 02/11/18 1136    Visit Number  1    Number of Visits  4    Date for PT Re-Evaluation  03/25/18    PT Start Time  1105    PT Stop Time  1136    PT Time Calculation (min)  31 min    Activity Tolerance  Patient tolerated treatment well    Behavior During Therapy  [facility] for tasks assessed/performed       Past Medical History:  Diagnosis Date  . Headache   . Paresthesia     Past Surgical History:  Procedure Laterality Date  . APPENDECTOMY    . EYE SURGERY Right "as a young child"    There were no vitals filed for this visit.   Subjective Assessment - 02/11/18 1108    Subjective  Patient has been having neck pain for 2 years insidious onset. Patient works in a factory doing repetitive work. R side hurts more than left.    Patient is accompained by:  Family member;Interpreter Nuha    Pertinent History  unremarkable    Diagnostic tests  xray - degenerative     Patient Stated Goals  to get rid of pain    Currently in Pain?  Yes    Pain Score  7     Pain Location  Neck    Pain Orientation  Right;Posterior    Pain Descriptors / Indicators  Tightness    Pain Type  Chronic pain    Pain Radiating Towards  between neck and shoulders    Pain Onset  More than a month ago    Pain Frequency  Constant    Aggravating Factors   nothing    Pain Relieving Factors  massager    Effect of Pain on Daily Activities  painful          [facility] PT Assessment - 02/11/18 0001      Assessment   Medical Diagnosis  neck pain    Referring Provider  Gwendolyn Grant    Onset Date/Surgical Date  07/20/17      Precautions   Precautions  None      Balance  Screen   Has the patient fallen in the past 6 months  No    Has the patient had a decrease in activity level because of a fear of falling?   No    Is the patient reluctant to leave their home because of a fear of falling?   No      Prior Function   Level of Independence  Independent    Vocation  Full time employment    Vocation Requirements  in factory repetitive work light lifting      Posture/Postural Control   Posture/Postural Control  Postural limitations    Postural Limitations  Rounded Shoulders;Forward head;Increased thoracic kyphosis    Posture Comments  slight head tilt to left      ROM / Strength   AROM / PROM / Strength  AROM      AROM   Overall AROM Comments  Cervical full but flexibilty deficits      Flexibility   Soft Tissue Assessment /Muscle Length  -- tight  R lev scap and L SCM      Palpation   Spinal mobility  decreased PA mobility R cervical; T spine WNL    Palpation comment  Bil UT cervical Paraspinals Levator  and suboccipitals; very tender at medial scapula rhomboid attachment                 Objective measurements completed on examination: See above findings.              PT Education - 02/11/18 1144    Education Details  HeP    Person(s) Educated  Patient;Other (comment)    Methods  Explanation;Demonstration;Handout    Comprehension  Verbalized understanding;Returned demonstration          PT Long Term Goals - 02/11/18 1144      PT LONG TERM GOAL #1   Title  I with HEP    Status  New    Target Date  03/25/18      PT LONG TERM GOAL #2   Title  Patient to report decreased pain in his neck with ADLS by 50% or more.    Status  New      PT LONG TERM GOAL #3   Title  Patient to demo improved postural awareness in the clinic to prevent further injury.    Status  New             Plan - 02/11/18 1137    Clinical Impression Statement  Patient presents with complaints of constant neck pain. He has full ROM but  flexibilty deficits. He has increased tone along R cervical paraspinals and TPs in R UT, levator scap and rhomboids. He demonstrates poor posture and weak postural muscles. He is tall and has to look down at work all day. Patient will benefit from PT to address these deficits..    Clinical Presentation  Stable    Clinical Decision Making  Low    Rehab Potential  Excellent    PT Frequency  1x / week    PT Duration  6 weeks 4 visits total    PT Treatment/Interventions  ADLs/Self Care Home Management;Electrical Stimulation;Moist Heat;Ultrasound;Therapeutic exercise;Patient/family education;Manual techniques;Dry needling    PT Next Visit Plan  DN to suboccipitals, cspine, UT, Levator, L SCM. Postural strengthening.    PT Home Exercise Plan  cervical and scap retraction    Consulted and Agree with Plan of Care  Patient       Patient will benefit from skilled therapeutic intervention in order to improve the following deficits and impairments:  Pain, Increased muscle spasms, Postural dysfunction, Impaired flexibility  Visit Diagnosis: Cervicalgia - Plan: PT plan of care cert/re-cert     Problem List Patient Active Problem List   Diagnosis Date Noted  . Mood disorder in conditions classified elsewhere 05/27/2017  . Chronic pain syndrome 10/28/2016  . Tension-type headache, not intractable 07/09/2016  . Anxiety state 06/10/2016  . Deviated septum 03/27/2016  . Paresthesias 02/28/2016  . Chronic headaches 01/24/2016  . Epigastric pain 01/24/2016  . Myalgia 11/22/2015  . Seasonal allergies 11/11/2015  . H. pylori infection 08/17/2015  . Otalgia 08/01/2015  . H/O eye surgery 08/01/2015    Takai Chiaramonte PT 02/11/2018, 11:51 AM  [facility] [address] [place], [place], [address] Phone: [phone]   Fax:  [phone]  Name: Daniel Nelson MRN: [id number] Date of Birth: 02/04/1986

## 2018-02-16 ENCOUNTER — Ambulatory Visit (HOSPITAL_COMMUNITY)
Admission: EM | Admit: 2018-02-16 | Discharge: 2018-02-16 | Disposition: A | Discharge status: Home / Self Care | Payer: Medicaid Other | Attending: Internal Medicine | Admitting: Internal Medicine

## 2018-02-16 ENCOUNTER — Encounter (HOSPITAL_COMMUNITY): Payer: Self-pay | Admitting: Emergency Medicine

## 2018-02-16 ENCOUNTER — Other Ambulatory Visit: Payer: Self-pay

## 2018-02-16 ENCOUNTER — Ambulatory Visit (INDEPENDENT_AMBULATORY_CARE_PROVIDER_SITE_OTHER): Payer: Medicaid Other

## 2018-02-16 DIAGNOSIS — G8929 Other chronic pain: Secondary | ICD-10-CM | POA: Diagnosis not present

## 2018-02-16 DIAGNOSIS — M542 Cervicalgia: Secondary | ICD-10-CM | POA: Diagnosis not present

## 2018-02-16 MED ORDER — ONDANSETRON 4 MG PO TBDP: 4.0000 mg | ORAL_TABLET | Freq: Three times a day (TID) | ORAL | 0 refills | Status: AC | PRN

## 2018-02-16 MED ORDER — NAPROXEN 500 MG PO TABS: 500.0000 mg | ORAL_TABLET | Freq: Two times a day (BID) | ORAL | 2 refills | Status: AC

## 2018-02-16 MED ORDER — TRAMADOL HCL 50 MG PO TABS: 50.0000 mg | ORAL_TABLET | Freq: Four times a day (QID) | ORAL | 0 refills | Status: AC | PRN

## 2018-02-16 NOTE — ED Triage Notes (Signed)
Pt reports bilateral neck pain that radiates into his shoulders and both arms.  He denies any injury and says it started 10 days ago.

## 2018-02-16 NOTE — ED Provider Notes (Signed)
[facility]    CSN: [id number] Arrival date & time: 02/16/18  1558     History   Chief Complaint Chief Complaint  Patient presents with  . Neck Pain    HPI Daniel Nelson is a 32 y.o. male history of anxiety, paresthesias, chronic headaches/neck pain presenting today for evaluation of neck pain.  Patient states that he experiences a sharp pain between his shoulder blades, will radiate up into his head as well as into his arms.  Symptoms began approximately 10 days ago.  Denies any injury or increase in activity.  Occasionally associated with nausea, dizziness, feeling off balance, blurry vision.  States that his symptoms are worse when he is at work.  Denies these associated symptoms at time of visit, and states that his pain is not that bad currently.  He recently saw his PCP who ordered a cervical x-ray, he has not obtained this as he was waiting for his Medicaid to become active.  He has had similar symptoms over the past 2 years, in the past he has had multiple x-rays, CTAs of the head/neck as well as MRIs that have all been negative.  He states that his pain now is different than what it had been in the past.  HPI  Past Medical History:  Diagnosis Date  . Headache   . Paresthesia     Patient Active Problem List   Diagnosis Date Noted  . Mood disorder in conditions classified elsewhere 05/27/2017  . Chronic pain syndrome 10/28/2016  . Tension-type headache, not intractable 07/09/2016  . Anxiety state 06/10/2016  . Deviated septum 03/27/2016  . Paresthesias 02/28/2016  . Chronic headaches 01/24/2016  . Epigastric pain 01/24/2016  . Myalgia 11/22/2015  . Seasonal allergies 11/11/2015  . H. pylori infection 08/17/2015  . Otalgia 08/01/2015  . H/O eye surgery 08/01/2015    Past Surgical History:  Procedure Laterality Date  . APPENDECTOMY    . EYE SURGERY Right "as a young child"       Home Medications    Prior to Admission medications     Medication Sig Start Date End Date Taking? Authorizing Provider  naproxen (NAPROSYN) 500 MG tablet Take 1 tablet (500 mg total) by mouth 2 (two) times daily. Take with food 02/16/18   Wieters, Hallie C, PA-C  traMADol (ULTRAM) 50 MG tablet Take 1 tablet (50 mg total) by mouth every 6 (six) hours as needed for up to 3 days. 02/16/18 02/19/18  Wieters, Junius Creamer, PA-C    Family History Family History  Problem Relation Age of Onset  . Rheum arthritis Mother   . Healthy Father   . Colon cancer Neg Hx     Social History Social History   Tobacco Use  . Smoking status: Former Games developer  . Smokeless tobacco: Never Used  . Tobacco comment: hookah  Substance Use Topics  . Alcohol use: No  . Drug use: No     Allergies   Mango flavor; Duloxetine; Tramadol; and Dicyclomine   Review of Systems Review of Systems  Constitutional: Negative for fatigue and fever.  HENT: Negative for congestion, sinus pressure and sore throat.   Eyes: Negative for photophobia, pain and visual disturbance.  Respiratory: Negative for cough and shortness of breath.   Cardiovascular: Negative for chest pain.  Gastrointestinal: Negative for abdominal pain, nausea and vomiting.  Genitourinary: Negative for decreased urine volume and hematuria.  Musculoskeletal: Positive for myalgias, neck pain and neck stiffness. Negative for arthralgias, back pain, gait  problem and joint swelling.  Skin: Negative for color change and wound.  Neurological: Positive for numbness and headaches. Negative for dizziness, syncope, facial asymmetry, speech difficulty, weakness and light-headedness.     Physical Exam Triage Vital Signs ED Triage Vitals  Enc Vitals Group     BP 02/16/18 1609 105/60     Pulse Rate 02/16/18 1609 (!) 58     Resp 02/16/18 1609 16     Temp 02/16/18 1609 98.1 F (36.7 C)     Temp Source 02/16/18 1609 Oral     SpO2 02/16/18 1609 100 %     Weight --      Height --      Head Circumference --      Peak Flow  --      Pain Score 02/16/18 1615 4     Pain Loc --      Pain Edu? --      Excl. in GC? --    No data found.  Updated Vital Signs BP 105/60 (BP Location: Left Arm)   Pulse (!) 58   Temp 98.1 F (36.7 C) (Oral)   Resp 16   SpO2 100%   Visual Acuity Right Eye Distance:   Left Eye Distance:   Bilateral Distance:    Right Eye Near:   Left Eye Near:    Bilateral Near:     Physical Exam  Constitutional: He is oriented to person, place, and time. He appears well-developed and well-nourished.  HENT:  Head: Normocephalic and atraumatic.  Eyes: Pupils are equal, round, and reactive to light. Conjunctivae and EOM are normal.  Neck: Neck supple.  Cardiovascular: Normal rate and regular rhythm.  No murmur heard. No carotid bruits auscultated  Pulmonary/Chest: Effort normal and breath sounds normal. No respiratory distress.  Abdominal: Soft. There is no tenderness.  Musculoskeletal: He exhibits no edema.  Tenderness to palpation along trapezius and sternocleidomastoid musculature midline as well as tenderness extending onto scalp and occipital region bilaterally, nontender to palpation of cervical spine midline, mild tenderness to palpation to superior aspect of thoracic spine, but also has tenderness to palpation of paraspinal musculature in this region as well.  Full active range of motion, increased pain with forward extension of neck.  Neurological: He is alert and oriented to person, place, and time.  Patient A&O x3, cranial nerves II-XII grossly intact, strength at shoulders, hips and knees 5/5, equal bilaterally, patellar reflex 2+ bilaterally.Negative Romberg and Pronator Drift. Gait without abnormality.   Skin: Skin is warm and dry.  Psychiatric: He has a normal mood and affect.  Nursing note and vitals reviewed.    UC Treatments / Results  Labs (all labs ordered are listed, but only abnormal results are displayed) Labs Reviewed - No data to  display  EKG None  Radiology No results found.  Procedures Procedures (including critical care time)  Medications Ordered in UC Medications - No data to display  Initial Impression / Assessment and Plan / UC Course  I have reviewed the triage vital signs and the nursing notes.  Pertinent labs & imaging results that were available during my care of the patient were reviewed by me and considered in my medical decision making (see chart for details).    Repeat cervical spine obtained given this was initially ordered by his PCP and has not yet had performed.  X-ray negative for fracture abnormality, does show straightening and loss of normal concave curvature in the cervical region.  At this time will  treat with naproxen, provided tramadol to only use for severe pain, stressed the importance of this, discussed sedation regarding tramadol and advised to only use at bedtime as well.  Do not work while taking.  Follow-up with PCP.Discussed strict return precautions. Patient verbalized understanding and is agreeable with plan.  Final Clinical Impressions(s) / UC Diagnoses   Final diagnoses:  Chronic neck pain     Discharge Instructions     Use anti-inflammatories for pain/swelling. You may take Naprosyn twice daily with food. You may supplement Ibuprofen with Tylenol [phone] mg every 8 hours.   Alternate heating pad and ice  Tramadol only for severe pain or night time  Please follow-up with your primary care, and continue with physical therapy  Please go to emergency room if developing worsening pain, changes in vision, persistent dizziness, episode of passing out   ED Prescriptions    Medication Sig Dispense Auth. Provider   naproxen (NAPROSYN) 500 MG tablet Take 1 tablet (500 mg total) by mouth 2 (two) times daily. Take with food 30 tablet Wieters, Hallie C, PA-C   traMADol (ULTRAM) 50 MG tablet Take 1 tablet (50 mg total) by mouth every 6 (six) hours as needed for up to 3  days. 15 tablet Wieters, [place] C, PA-C     Controlled Substance Prescriptions [place] Controlled Substance Registry consulted? Yes, I have consulted the  Controlled Substances Registry for this patient, and feel the risk/benefit ratio today is favorable for proceeding with this prescription for a controlled substance.   Sharyon Cable [place] C, PA-C 02/17/18 [phone]

## 2018-02-16 NOTE — Discharge Instructions (Addendum)
Use anti-inflammatories for pain/swelling. You may take Naprosyn twice daily with food. You may supplement Ibuprofen with Tylenol [phone] mg every 8 hours.   Alternate heating pad and ice  Tramadol only for severe pain or night time  Please follow-up with your primary care, and continue with physical therapy  Please go to emergency room if developing worsening pain, changes in vision, persistent dizziness, episode of passing out

## 2018-02-22 ENCOUNTER — Ambulatory Visit: Payer: Medicaid Other | Admitting: Family Medicine

## 2018-03-04 ENCOUNTER — Ambulatory Visit: Payer: Medicaid Other | Attending: Family Medicine | Admitting: Physical Therapy
